# Patient Record
Sex: Male | Born: 1965 | Race: White | Hispanic: No | Smoking: Former smoker
Health system: Southern US, Community
[De-identification: ages and names within clinical notes are randomized; demographics above are authoritative.]

## PROBLEM LIST (undated history)

## (undated) DIAGNOSIS — Z87442 Personal history of urinary calculi: Secondary | ICD-10-CM

## (undated) DIAGNOSIS — E11319 Type 2 diabetes mellitus with unspecified diabetic retinopathy without macular edema: Secondary | ICD-10-CM

## (undated) DIAGNOSIS — F419 Anxiety disorder, unspecified: Secondary | ICD-10-CM

## (undated) DIAGNOSIS — M1990 Unspecified osteoarthritis, unspecified site: Secondary | ICD-10-CM

## (undated) DIAGNOSIS — K3184 Gastroparesis: Secondary | ICD-10-CM

## (undated) DIAGNOSIS — IMO0002 Reserved for concepts with insufficient information to code with codable children: Secondary | ICD-10-CM

## (undated) DIAGNOSIS — T8859XA Other complications of anesthesia, initial encounter: Secondary | ICD-10-CM

## (undated) DIAGNOSIS — S32402A Unspecified fracture of left acetabulum, initial encounter for closed fracture: Secondary | ICD-10-CM

## (undated) DIAGNOSIS — I509 Heart failure, unspecified: Secondary | ICD-10-CM

## (undated) DIAGNOSIS — J189 Pneumonia, unspecified organism: Secondary | ICD-10-CM

## (undated) DIAGNOSIS — I209 Angina pectoris, unspecified: Secondary | ICD-10-CM

## (undated) DIAGNOSIS — R011 Cardiac murmur, unspecified: Secondary | ICD-10-CM

## (undated) DIAGNOSIS — E785 Hyperlipidemia, unspecified: Secondary | ICD-10-CM

## (undated) DIAGNOSIS — R0600 Dyspnea, unspecified: Secondary | ICD-10-CM

## (undated) DIAGNOSIS — E119 Type 2 diabetes mellitus without complications: Secondary | ICD-10-CM

## (undated) DIAGNOSIS — F32A Depression, unspecified: Secondary | ICD-10-CM

## (undated) DIAGNOSIS — K219 Gastro-esophageal reflux disease without esophagitis: Secondary | ICD-10-CM

## (undated) DIAGNOSIS — I639 Cerebral infarction, unspecified: Secondary | ICD-10-CM

## (undated) DIAGNOSIS — R93429 Abnormal radiologic findings on diagnostic imaging of unspecified kidney: Secondary | ICD-10-CM

## (undated) DIAGNOSIS — N189 Chronic kidney disease, unspecified: Secondary | ICD-10-CM

## (undated) DIAGNOSIS — Z9289 Personal history of other medical treatment: Secondary | ICD-10-CM

## (undated) HISTORY — PX: TUBAL LIGATION: SHX77

## (undated) HISTORY — PX: KNEE ARTHROSCOPY W/ MENISCAL REPAIR: SHX1877

## (undated) HISTORY — DX: Personal history of other medical treatment: Z92.89

## (undated) HISTORY — PX: WISDOM TOOTH EXTRACTION: SHX21

## (undated) HISTORY — PX: COSMETIC SURGERY: SHX468

## (undated) HISTORY — DX: Cerebral infarction, unspecified: I63.9

## (undated) HISTORY — DX: Type 2 diabetes mellitus without complications: E11.9

## (undated) HISTORY — PX: TONSILLECTOMY: SUR1361

## (undated) HISTORY — PX: CARDIAC CATHETERIZATION: SHX172

## (undated) HISTORY — PX: UPPER GASTROINTESTINAL ENDOSCOPY: SHX188

## (undated) HISTORY — DX: Unspecified fracture of left acetabulum, initial encounter for closed fracture: S32.402A

## (undated) HISTORY — PX: CORONARY ANGIOPLASTY: SHX604

## (undated) HISTORY — PX: OTHER SURGICAL HISTORY: SHX169

## (undated) HISTORY — DX: Reserved for concepts with insufficient information to code with codable children: IMO0002

## (undated) HISTORY — DX: Hyperlipidemia, unspecified: E78.5

---

## 1998-11-10 ENCOUNTER — Inpatient Hospital Stay (HOSPITAL_COMMUNITY): Admission: EM | Admit: 1998-11-10 | Discharge: 1998-11-13 | Payer: Self-pay | Admitting: Cardiology

## 1998-11-11 ENCOUNTER — Encounter: Payer: Self-pay | Admitting: Cardiology

## 1998-11-12 ENCOUNTER — Encounter: Payer: Self-pay | Admitting: Cardiology

## 1999-01-17 ENCOUNTER — Emergency Department (HOSPITAL_COMMUNITY): Admission: EM | Admit: 1999-01-17 | Discharge: 1999-01-17 | Payer: Self-pay | Admitting: Emergency Medicine

## 1999-01-17 ENCOUNTER — Encounter: Payer: Self-pay | Admitting: Emergency Medicine

## 2000-04-01 ENCOUNTER — Inpatient Hospital Stay (HOSPITAL_COMMUNITY): Admission: EM | Admit: 2000-04-01 | Discharge: 2000-04-03 | Payer: Self-pay | Admitting: Emergency Medicine

## 2000-05-31 ENCOUNTER — Other Ambulatory Visit: Admission: RE | Admit: 2000-05-31 | Discharge: 2000-05-31 | Payer: Self-pay | Admitting: Internal Medicine

## 2000-05-31 ENCOUNTER — Encounter (INDEPENDENT_AMBULATORY_CARE_PROVIDER_SITE_OTHER): Payer: Self-pay | Admitting: Specialist

## 2000-08-21 ENCOUNTER — Encounter: Payer: Self-pay | Admitting: Emergency Medicine

## 2000-08-21 ENCOUNTER — Inpatient Hospital Stay (HOSPITAL_COMMUNITY): Admission: EM | Admit: 2000-08-21 | Discharge: 2000-08-23 | Payer: Self-pay | Admitting: Emergency Medicine

## 2001-11-15 ENCOUNTER — Emergency Department (HOSPITAL_COMMUNITY): Admission: EM | Admit: 2001-11-15 | Discharge: 2001-11-15 | Payer: Self-pay | Admitting: Emergency Medicine

## 2001-11-15 ENCOUNTER — Encounter: Payer: Self-pay | Admitting: Emergency Medicine

## 2008-03-10 ENCOUNTER — Emergency Department (HOSPITAL_COMMUNITY): Admission: EM | Admit: 2008-03-10 | Discharge: 2008-03-10 | Payer: Self-pay | Admitting: Emergency Medicine

## 2010-07-19 ENCOUNTER — Emergency Department (HOSPITAL_COMMUNITY): Payer: Commercial Managed Care - PPO

## 2010-07-19 ENCOUNTER — Observation Stay (HOSPITAL_COMMUNITY)
Admission: EM | Admit: 2010-07-19 | Discharge: 2010-07-20 | Disposition: A | Discharge status: Home / Self Care | Payer: Commercial Managed Care - PPO | Attending: Emergency Medicine | Admitting: Emergency Medicine

## 2010-07-19 DIAGNOSIS — R0602 Shortness of breath: Secondary | ICD-10-CM | POA: Insufficient documentation

## 2010-07-19 DIAGNOSIS — R110 Nausea: Secondary | ICD-10-CM | POA: Insufficient documentation

## 2010-07-19 DIAGNOSIS — R079 Chest pain, unspecified: Principal | ICD-10-CM | POA: Insufficient documentation

## 2010-07-19 LAB — POCT I-STAT, CHEM 8
Glucose, Bld: 94 mg/dL (ref 70–99)
HCT: 46 % (ref 39.0–52.0)
Hemoglobin: 15.6 g/dL (ref 13.0–17.0)
Sodium: 141 mEq/L (ref 135–145)
TCO2: 24 mmol/L (ref 0–100)

## 2010-07-19 LAB — POCT CARDIAC MARKERS
CKMB, poc: <1 ng/mL — ABNORMAL LOW (ref 1.0–8.0)
Myoglobin, poc: 93.9 ng/mL (ref 12–200)
Troponin i, poc: <0.05 ng/mL (ref 0.00–0.09)

## 2010-07-19 LAB — CBC
HCT: 43.6 % (ref 39.0–52.0)
MCH: 28.5 pg (ref 26.0–34.0)
RDW: 13.1 % (ref 11.5–15.5)

## 2010-07-19 LAB — DIFFERENTIAL
Basophils Absolute: 0 10*3/uL (ref 0.0–0.1)
Basophils Relative: 0 % (ref 0–1)
Lymphs Abs: 2.2 10*3/uL (ref 0.7–4.0)
Monocytes Relative: 6 % (ref 3–12)

## 2010-07-20 DIAGNOSIS — R0789 Other chest pain: Secondary | ICD-10-CM

## 2010-07-20 LAB — POCT CARDIAC MARKERS
CKMB, poc: 1.2 ng/mL (ref 1.0–8.0)
Myoglobin, poc: 111 ng/mL (ref 12–200)
Troponin i, poc: <0.05 ng/mL (ref 0.00–0.09)

## 2010-07-26 NOTE — Consult Note (Signed)
[name], [name]                  ACCOUNT NO.:  [id number]  MEDICAL RECORD NO.:  [id number]           PATIENT TYPE:  LOCATION:                                 FACILITY:  PHYSICIAN:  Leyna Vanderkolk C. Eden Emms, MD, FACCDATE OF BIRTH:  Nov 25, 1965  DATE OF CONSULTATION:  07/20/2010 DATE OF DISCHARGE:                                CONSULTATION   PRIMARY CARE PHYSICIAN:  Located at [facility].  He has not followed by Cardiology.  REASON FOR CONSULTATION:  Chest discomfort.  HISTORY OF PRESENT ILLNESS:  [name] is a 45 year old Caucasian gentleman with no known history of coronary artery disease having had a cardiac catheterization in 2000 that was negative as well as a negative Myoview with normal left ventricular ejection fraction in March 2002 but risk factors of morbid obesity and positive family history as well as history significant for history of chest pain with negative workup as above, history of peptic ulcer disease (resolved but previously on Pepcid) as well as remote tobacco abuse quitting in 2000, presenting with similar chest discomfort as his prior episodes lasting for greater than 24 hours resolving with morphine in the emergency department late last evening.  The patient was in his usual state of health until the evening of July 18, 2010, when he began to experience his typical chest discomfort described as a pressure in the chest and then a squeezing in the chest.  The patient did have some mild shortness of breath with this without radiation.  In the past, he has had radiation but this time he had none.  He did not have any diaphoresis, vomiting, presyncope, palpitations, or other complaints at that time.  He simply tried to ignore his symptoms, but unfortunately they continued throughout the next day.  He did note that he had more shortness of breath with activity, but his chest discomfort was not affected by exertion.  He did not note any other clear aggravating or  alleviating factors until he finally presented to [facility] ED for further eval and was given 4 mg of IV morphine with total resolution of symptoms.  He did have some nausea and eventually vomited after the morphine, but then was given Zofran and currently is asymptomatic, just frustrated with his recurrent symptoms. EKG was completed that showed no acute changes.  Point-of-care markers are negative x2 sets and chest x-ray without acute disease.  He was mildly hypertensive with a blood pressure of 159/100 on admission, but has since gone back to his baseline, actually one reading of low blood pressure 96/55, currently 110s/60s.  Otherwise, vital signs within normal limits and stable. 1. History of chest pain.     a.     Cardiac catheterization in 2000 (no evidence of coronary      artery disease).     b.     Myoview on August 14, 2000:  No evidence of ischemia, normal      EF. 2. Morbid obesity. 3. History of PUD (resolved with Pepcid). 4. Osteoarthritis (knees). 5. History of kidney tumor.     a.     S/P chemotherapy, 1992.  SOCIAL HISTORY:  The patient lives in [place] with a roommate.  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  He has a 30+ pack-year smoking history that is remote having quit over 10 years ago.  No EtOH or illicit drug use.  No herbal meds.  Regular diet.  Walks a lot at work, but no other exercise.  FAMILY HISTORY:  Father with MI in his 50s.  Mother, no known history of coronary artery disease.  Siblings including two brothers and a sister without any known coronary artery disease.  REVIEW OF SYSTEMS:  Please see HPI.  All other systems were reviewed. No orthopnea.  No lower extremity edema.  No fevers, chills.  No significant coughing.  No other complaints.  CODE STATUS:  Full.  ALLERGIES: 1. DEMEROL. 2. PERCOCET.  MEDICATIONS:  No home medications.  In the emergency department,  given morphine 4 mg IV x1 and Zofran 4 mg IV x2.  PHYSICAL EXAMINATION:  VITAL SIGNS:  Temperature 97.4 degrees Fahrenheit, BP 96-159 over 55-100, pulse 68-85, respiration rate 19-20, O2 saturation 95% on room air. GENERAL:  The patient is alert and oriented x3 in no apparent distress, able to speak easily in full sentences without respiratory distress. HEENT:  Head is normocephalic, atraumatic.  Pupils equal, round, reactive to light.  Extraocular muscles are intact.  Nares without discharge.  Dentition is fair.  Oropharynx without erythema or exudates. NECK:  Supple without lymphadenopathy.  No thyromegaly.  No JVD.  No bruit. HEART:  Rate is regular with audible S1 and S2.  No clicks, rubs, murmurs, or gallops, and pulses are 2+ and equal in both upper and lower extremities bilaterally. LUNGS:  Clear to auscultation bilaterally. SKIN:  No rashes, lesions, or petechiae. ABDOMEN:  Soft, nontender, nondistended.  Normal abdominal bowel sounds. No rebound or guarding.  No hepatosplenomegaly.  The patient is morbidly obese. EXTREMITIES:  Without clubbing, cyanosis or edema. MUSCULOSKELETAL:  Without joint deformity or effusions.  No spinal or CVA tenderness. NEUROLOGIC:  Cranial nerves II through XII grossly intact.  Strength 5/5 in all extremities and axial groups.  Normal sensation throughout. Normal cerebellar function.  RADIOLOGY:  Chest x-ray two-view showed no acute disease.  EKG rhythm is normal sinus, 71 bpm, no significant ST-T-wave changes. No significant Q-waves.  Normal axis.  No evidence of hypertrophy.  PR 208, QRS 92, QTc 430.  When compared to a prior tracing from October 2009, no significant changes.  LABORATORY DATA:  WBC is 8.5, HGB 14.7, HCT 43.6, PLT count is 255,000. Sodium 141, potassium 4.3, chloride 106, BUN 13, creatinine 1.0, glucose 94.  Point-of-care markers negative x2.  ASSESSMENT AND PLAN:  The patient initially seen by Jarrett Ables, PA- C, and  then seen and thoroughly assessed by attending cardiologist, Dr. Charlton Haws.  Mr. Chapdelaine is a 45 year old Caucasian gentleman with no known history of coronary artery disease but unfortunately history of recurrent chest discomfort with prior negative cardiac catheterization in 2000 and a negative Myoview with normal ejection fraction in March 1972 but risk factors of CAD including morbid obesity and positive family history as well as history significant for PUD now presents with atypical chest numbers similar to his prior symptoms that had previously yielded a negative workup.  Chest pain - symptoms atypical.  No objective evidence for cardiac etiology.  We would treat for a GI etiology and set up outpatient Myoview.  As the patient weighs 267 pounds, this  is not able to be set up in our office and therefore we will set up here at [facility] for August 10, 2010, and August 11, 2010 (needs to be a 2-day study given the patient's girth).  The patient will also be given a prescription for famotidine 20 mg p.o. b.i.d. x6 weeks and be instructed to follow up with his primary care provider in the interim.  Should the patient have evidence of ischemia or significantly depressed LVEF, he will follow up in [place] with Dr. Julien Nordmann.     Jarrett Ables, PAC   ______________________________ Noralyn Pick. Eden Emms, MD, [facility]    MS/MEDQ  D:  07/20/2010  T:  07/20/2010  Job:  [id number]  Electronically Signed by Jarrett Ables PAC on 07/22/2010 01:07:01 PM Electronically Signed by Charlton Haws MD FACC on 07/26/2010 09:51:58 AM

## 2010-08-10 ENCOUNTER — Ambulatory Visit (HOSPITAL_COMMUNITY)
Admit: 2010-08-10 | Discharge: 2010-08-10 | Disposition: A | Discharge status: Home / Self Care | Payer: Commercial Managed Care - PPO | Attending: Cardiovascular Disease | Admitting: Cardiovascular Disease

## 2010-08-10 DIAGNOSIS — R079 Chest pain, unspecified: Secondary | ICD-10-CM | POA: Insufficient documentation

## 2010-08-11 ENCOUNTER — Ambulatory Visit (HOSPITAL_COMMUNITY)
Admit: 2010-08-11 | Discharge: 2010-08-11 | Disposition: A | Discharge status: Home / Self Care | Payer: Commercial Managed Care - PPO | Attending: Cardiovascular Disease | Admitting: Cardiovascular Disease

## 2010-08-11 DIAGNOSIS — R079 Chest pain, unspecified: Secondary | ICD-10-CM

## 2010-08-11 MED ORDER — TECHNETIUM TC 99M TETROFOSMIN IV KIT
30.0000 | PACK | Freq: Once | INTRAVENOUS | Status: AC | PRN
  Administered 2010-08-10: 30 via INTRAVENOUS

## 2010-08-11 MED ORDER — TECHNETIUM TC 99M TETROFOSMIN IV KIT
30.0000 | PACK | Freq: Once | INTRAVENOUS | Status: AC | PRN
  Administered 2010-08-11: 30 via INTRAVENOUS

## 2010-08-15 ENCOUNTER — Ambulatory Visit: Payer: Commercial Managed Care - PPO

## 2010-08-15 ENCOUNTER — Telehealth: Payer: Self-pay | Admitting: Cardiovascular Disease

## 2010-08-23 NOTE — Progress Notes (Signed)
Summary: stress test  Phone Note Other Incoming   Caller: Mechele Dawley office Summary of Call: received call from [place] that pt was calling their office for his test results, he had a stress test at [facility] on 3/8, pt cell [phone], called pt and gave him his test results, he states he is feeling better advised if needs f/u appt to call back and sch he is agreeable  Initial call taken by: Meredith Staggers, RN,  August 15, 2010 3:30 PM

## 2010-10-21 NOTE — Discharge Summary (Signed)
[facility]  Patient:    [name], [name]                         MRN: [id number] Adm. Date:  [id number] Disc. Date: [id number] Attending:  Darnelle Bos CC:         [facility]                           Discharge Summary  ADMISSION DIAGNOSIS:  Chest pain.  DISCHARGE DIAGNOSES: 1. Chest pain, myocardial infarction ruled out without evidence of coronary    artery disease on stress Cardiolite. 2. History of congestive heart failure with normal coronary arteries and    normal left ventricular function several years ago. 3. Recent peptic ulcer disease.  HISTORY OF PRESENT ILLNESS:  Please see the admitting history and physical examination for details, but briefly, the patient had chest pain on the day of admission that was a tight feeling in his chest whenever he would walk around. He came to the emergency room where it was thought that he might have suffered from either a myocardial infarction or at least severe angina, and he was referred to me for admission.  HOSPITAL COURSE:  The patient was admitted, and on the basis of serial enzymes and EKGs, myocardial infarction was ruled out.  He was treated with heparin and IV nitroglycerin.  His pain resolved.  On the second hospital day he had a stress Cardiolite which was negative, and he was discharged in improved condition to continue taking his Prevacid 30 mg q.d.  FOLLOWUP:  He is to follow up with Urgent Care Center within the next 2 to 3 weeks.  DISCHARGE MEDICATIONS:  Prevacid 30 mg q.d.  ACTIVITY:  As tolerated.  DIET:  As tolerated. DD:  08/28/00 TD:  08/29/00 Job: [id number]

## 2010-10-21 NOTE — Discharge Summary (Signed)
[place]  Patient:    [name], [name]                         MRN: [id number] Adm. Date:  [id number] Disc. Date: 04/03/00 Attending:  Phifer, Harriett Sine [place] Dictator:   Milana Huntsman, M.D. CC:         Wilhemina Bonito. Eda Keys., M.D. [facility]  Dr. Molli Hazard   Discharge Summary  DISCHARGE DIAGNOSES: 1. Gastric ulcer with upper gastrointestinal bleed. 2. History of episode of congestive heart failure in June 2000, with normal    cardiac catheterization performed by Dr. Clarene Duke.  Per Dr. Darrol Poke report,    there was no clear reason why the patient had this episode.  ALLERGIES:  DEMEROL and PERCOCET.  MEDICATIONS:  None.  HISTORY OF PRESENT ILLNESS:  This is a 45 year old white male with no significant previous medical history who presented with vomiting blood x 1. The patient states that he had had a cold for the past several days with one episode of diarrhea roughly four to five days prior to presentation with brown stools at that time and had no further episodes of diarrhea, but on the morning of presentation awoke feeling nauseated without abdominal pain.  He went to the bathroom and had one episode of bloody emesis and decided to present to the emergency department.  The patient states he had no previous history of similar episodes and he specifically denied any use of NSAIDs, alcohol or GERD type symptoms.  He also states that he quit drinking several years ago and had no know risk factors for liver disease.  SOCIAL HISTORY:  The patient does not smoke, does not drink alcohol and has no history of IV drug abuse.  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  FAMILY HISTORY:  Grandmother with ulcers.  All siblings, two brothers and two sisters, with GERD.  PHYSICAL EXAMINATION:  VITAL SIGNS:  Temperature 97.3, pulse 120, blood pressure 126/67, respiratory rate 20, saturations 99% on room air.  GENERAL:  He was alert and  conversational in mild distress lying in bed.  HEENT:  Normocephalic, atraumatic with no lesions orally.  CARDIOVASCULAR:  Regular rate and rhythm with distant heart sounds.  LUNGS:  Clear to auscultation bilaterally.  ABDOMEN:  Obese and diffusely tender.  No guarding.  EXTREMITIES:  Warm and well-profused.  NEUROLOGIC:  No gross deficits.  Cranial nerves II-XII intact.  LABORATORY DATA AND X-RAY FINDINGS:  On admission, white count 12.1, hemoglobin 12.9, hematocrit 36.8, platelets 256, MCV 82.  PTT 27, PT 13, INR 1.1.  Sodium 139, potassium 3.9, chloride 105, bicarb 26, BUN 27, creatinine 1.1 and glucose 141, calcium 8.7.  Albumin 2.3, total protein 6.5, AST 24, ALT 24, Alk phos 39, total bilirubin 0.6.  ASSESSMENT/PLAN:  The patient with upper GI bleed.  It was felt that peptic ulcer disease was most likely cause, however, gastritis, Mallory-Weiss tears and to a much lesser extent, varices, were in the differential.  CONSULTATIONS:  Gastroenterology.  PROCEDURES:  Esophagogastroduodenoscopy which showed mucosal abnormality in duodenal bulb.  Erosions present.  Erythematous mucosa.  Ulcer in the antrum at 7 oclock with maximum size 7 mm, not bleeding with clear ulcer base.  HOSPITAL COURSE:  The patient had two more episodes of frank bloody vomiting while in the emergency department.  He then had no other episodes during the course of his hospitalization.  He was admitted  to the transitional care unit and watched closely.  His hemoglobin trended down from initial presentation of 12.9 to 10.4 on the following day.  Overnight, the patient had a drop in his blood pressures from what had been in the 120s/60s-70s to a low of 80/40.  The patient was fluid resuscitated with a blood pressure, placed in Trendelenburg, and his blood pressure came up to 100-110/50s-60s.  During the episode of acute hypotension, the patient felt somewhat dizzy, but had no loss of consciousness.   Gastroenterology was consulted and on April 02, 2000, did an upper EGD which showed both mucosal abnormality in the duodenum and ulcer. After the upper GI series, the patient did quite well.  GI felt that he did not need any further intervention and could be managed medically.  He was found to be H. pylori negative, therefore, not requiring antibiotic therapy.  DISCHARGE LABORATORY DATA:  Hemoglobin 10.1.  DISPOSITION:  Discharged to home in good health.  FOLLOWUP:  Follow up with Dr. Yancey Flemings at [facility] on Monday, May 14, 2000.  Follow up with his primary care physician, Dr. Lilyan Punt. The patient was scheduled to have a repeat upper endoscopy on May 30, 2000.  DISCHARGE MEDICATIONS: 1. Protonix 40 mg one b.i.d. x 7 days, then one once a day. 2. Iron 325 mg b.i.d.  SPECIAL INSTRUCTIONS:  The patient was instructed not to take any NSAIDs.DD: 04/03/00 TD:  04/03/00 Job: [id number]

## 2010-10-21 NOTE — Consult Note (Signed)
[facility]  Patient:    [name], [name]                         MRN: [id number] Proc. Date: 08/22/00 Adm. Date:  [id number] Attending:  Darnelle Bos                          Consultation Report  REFERRING:  Winn Jock. Earl Gala, M.D.  REASON FOR CONSULTATION:  Chest pain.  HISTORY OF PRESENT ILLNESS:  [name] is a pleasant 45 year old male who developed an episode of substernal chest pain two nights prior to presentation.  The patient states he took two aspirins, went to bed, and the pain resolved.  He went to work the next day and while sitting at his desk he developed chest pain again.  The pain was described as a squeezing-type pain associated with radiation to his biceps and groin.  Aggravating factors include exertion.  The pain appeared to be relieved with rest.  There was associated diaphoresis but no nausea and no vomiting.  He presented to the emergency room when the chest pain became significantly worse.  He was treated in the emergency room with aspirin.  Heparin was not initiated secondary to peptic ulcer disease October 2001.  He was started on IV nitroglycerin which resolved his chest pain.  Of note, the patient has had a similar episode of the chest pain in June 2000.  At this time, left heart catheterization was performed by Dr. Julieanne Manson.  He was noted to have normal coronary arteries with normal systolic function.  Of note, the patient presents at this time with congestive heart failure and a PO2 of 60 on room air.  Serial cardiac enzymes were negative at that time.  PAST MEDICAL HISTORY: 1. Peptic ulcer disease October 2000 which resolved with Pepcid.  Followup    endoscopy in December 2001 was normal. 2. Kidney tumor treated with chemotherapy approximately 10 years ago. 3. Morbid obesity. 4. Arthritis primarily involving his knees.  PAST SURGICAL HISTORY:  Left heart catheterization in June 2000, as  noted above.  ALLERGIES: 1. DEMEROL, which results in throat swelling and apnea. 2. PERCOCET, which results in a rash.  CURRENT MEDICATIONS: 1. Protonix 40 mg p.o. q.d. 2. Phenergan p.r.n. 3. Heparin. 4. Nitroglycerin 5. Tylenol p.r.n.  SOCIAL HISTORY:  He is single.  He quit smoking three years ago.  No history of alcohol for five years.  Previously in the [employer], at which time his cancer was treated.  Currently, no available records for his chemotherapy. The patient currently is employed as a Advertising copywriter at [employer].  He lives with his father.  Significant caffeine intake including eight cups per day.  FAMILY HISTORY:  Father is 47 and had a myocardial infarction at age 1 after a PTCA and continues to smoke.  Mother is 23 with myocardial infarction at age 76.  Two brothers, one with hypertension.  Two sisters who are alive and well.  REVIEW OF SYSTEMS:  No changes in vision or hearing.  No dysphagia.  No blood in the stool.  Continues to have arthritic pain.  History is significant for dependent edema.  No orthopnea, no PND.  PHYSICAL EXAMINATION:  GENERAL:  A morbidly obese male, pleasant.  Currently in no acute distress. He states he continues to have a nagging, squeezing chest pain.  Chest pain has resolved significantly from admission.  VITAL  SIGNS:  Blood pressure 113/72, heart rate 78, respiratory rate 18, O2 saturation 96% on room air.  NECK:  Difficult to exam secondary to his neck size.  There is no obvious neck vein distention.  Good carotid upstrokes.  No carotid bruits are noted.  PULMONARY:  No use of accessory muscles.  Breath sounds are equal and clear to auscultation.  CARDIOVASCULAR:  Regular rate and rhythm.  No rubs, murmurs, or gallops. Unable to palpate the PMI.  ABDOMEN:  Morbidly obese.  No bruits are noted.  No unusual hepatosplenomegaly.  Exam made difficult by obesity.  EXTREMITIES:  Distal pulses are equal and palpable.  There  is trace pedal edema noted.  SKIN:  Warm and dry.  NEUROLOGIC:  Nonfocal.  Motor is 5/5 throughout.  LABORATORY DATA:  White count 6.4, hematocrit 41, platelets 252.  Normal coags.  The patient has been initiated on heparin.  Normal electrolytes. Creatinine is 1.5.  Normal liver enzymes.  Serial cardiac enzymes have been negative.  Chest x-ray reveals no acute disease.  ECG reveals normal sinus rhythm.  No acute ST changes.  T-wave inversion noted in lead III.  IMPRESSION:  A 45 year old male with ongoing chest pain.  Previous left heart catheterization in June 2000 revealed no significant coronary artery disease. EKG reveals no evidence of acute ischemia.  We will proceed with stress Cardiolite in the a.m. for further evaluation.  Agree with continuation of Protonix for possible GERD symptoms.  Continue with IV nitroglycerin and heparin.  The patient has had no obvious GI bleed.  Further recommendations pending the outcome on the stress Cardiolite. DD:  08/22/00 TD:  08/23/00 Job: [id number]

## 2010-10-21 NOTE — H&P (Signed)
[place]  Patient:    [name], [name]                         MRN: [id number] Adm. Date:  [id number] Attending:  Devoria Albe                         History and Physical  HISTORY OF PRESENT ILLNESS:  [name] is a 45 year old white male admitted with chest pain.  Yesterday, he felt fine.  He was at work today sitting at his desk when he suddenly developed chest discomfort.  It was squeezing pain associated with shooting pain into the biceps and into the groin.  It was worse with walking and better with sitting.  He was sweaty and dyspneic.  He went home, laid down, and the chest pain worsened so he came to the emergency room.  He has been given aspirin so far but has not been treated with heparin due to a history of bleeding ulcer in October 2001.  He was started on nitroglycerin. That helped the discomfort, especially the shooting pains in the arms and groin.  He noted that the pain was worsening and nitroglycerin has been increased.  In October 2001 he presented with hematemesis.  He was admitted to [facility] and had an EGD with findings of peptic ulcer disease.  He was treated with Protonix initially.  Follow-up endoscopy in December 2001 showed healing. There was also evidence of reflux and a hiatal hernia.  He was treated with Prevpac because he was H. pylori positive.  He remains on Prevacid and was to take it until his prescription ran out.  He has no heartburn on the Prevacid.  PAST MEDICAL HISTORY:  MEDICAL:  "Congestive heart failure" without clear etiology, status post coronary arteriogram in June 2000 by Dr. Clarene Duke.  This was reportedly negative.  VQ scan at that time was negative.  SURGICAL:  Knee arthroscopy bilaterally.  Tonsillectomy.  MEDICATIONS:  Prevacid 30 mg daily.  ALLERGIES: PERCOCET and DEMEROL.  SOCIAL HISTORY:  Cigarettes - quit three years ago.  Alcohol - negative x 5 years.  Works as a Dance movement psychotherapist at [employer].  Not married.  He and his father live together.  FAMILY HISTORY:  Father had an MI in his 19s and is otherwise alive and well. Mother alive and well.  REVIEW OF SYSTEMS:  Mild peripheral edema.  Otherwise negative.  PHYSICAL EXAMINATION:  VITAL SIGNS:  Blood pressure 140/70, pulse 92, respiratory rate 18, temperature 98.2.  HEENT:  Eyes have pupils which are round and reactive to light.  The extraocular movements are intact.  The funduscopic exam is normal.  Ears are normal, nose and throat are unremarkable.  NECK:  Supple.  Thyroid is not enlarged or tender.  CHEST:  Clear to auscultation and percussion.  CARDIAC:  Has a normal S1 and S2 without an S3, S4, murmur, rub, or click.  ABDOMEN:  Soft and nontender with normal bowel sounds without hepatosplenomegaly or mass.  EXTREMITIES:  Without cyanosis, clubbing, or edema.  LABORATORY DATA:  CMET, CBC, PT, PTT, troponin, and CK are all negative.  EKG is normal.  IMPRESSION: 1. Chest pain with typical and atypical features. 2. History of bleeding peptic ulcer disease. 3. Gastroesophageal reflux disease. 4. History of "congestive heart failure."  I will admit the patient to telemetry.  IV heparin will be added.  He  has already received aspirin and continue IV nitroglycerin.  Will obtain serial enzymes and cardiology consultation in the morning. DD:  08/21/00 TD:  08/22/00 Job: [id number]

## 2011-03-06 LAB — POCT I-STAT, CHEM 8
BUN: 14
Calcium, Ion: 1.22
Chloride: 106
Glucose, Bld: 99
Hemoglobin: 15
TCO2: 28

## 2011-03-06 LAB — CBC
MCHC: 33.4
Platelets: 213
RBC: 4.99
RDW: 13.1
WBC: 7.5

## 2011-03-06 LAB — POCT CARDIAC MARKERS
CKMB, poc: 1.1
Myoglobin, poc: 80.4
Troponin i, poc: <0.05

## 2012-07-09 ENCOUNTER — Ambulatory Visit (INDEPENDENT_AMBULATORY_CARE_PROVIDER_SITE_OTHER): Payer: Commercial Managed Care - PPO | Admitting: Family Medicine

## 2012-07-09 VITALS — BP 129/86 | HR 92 | Temp 98.4°F | Resp 17 | Ht 74.5 in | Wt 362.0 lb

## 2012-07-09 DIAGNOSIS — R071 Chest pain on breathing: Secondary | ICD-10-CM

## 2012-07-09 DIAGNOSIS — R0781 Pleurodynia: Secondary | ICD-10-CM | POA: Insufficient documentation

## 2012-07-09 DIAGNOSIS — M545 Low back pain, unspecified: Secondary | ICD-10-CM

## 2012-07-09 DIAGNOSIS — R109 Unspecified abdominal pain: Secondary | ICD-10-CM | POA: Insufficient documentation

## 2012-07-09 DIAGNOSIS — J069 Acute upper respiratory infection, unspecified: Secondary | ICD-10-CM

## 2012-07-09 LAB — POCT UA - MICROSCOPIC ONLY
Bacteria, U Microscopic: NEGATIVE
Casts, Ur, LPF, POC: NEGATIVE

## 2012-07-09 LAB — POCT URINALYSIS DIPSTICK
Bilirubin, UA: NEGATIVE
Glucose, UA: NEGATIVE
Leukocytes, UA: NEGATIVE
Protein, UA: 30
Urobilinogen, UA: 0.2

## 2012-07-09 MED ORDER — ACETAMINOPHEN-CODEINE #3 300-30 MG PO TABS: 1.0000 | ORAL_TABLET | ORAL | Status: DC | PRN

## 2012-07-09 MED ORDER — TAMSULOSIN HCL 0.4 MG PO CAPS: 0.4000 mg | ORAL_CAPSULE | Freq: Every day | ORAL | Status: DC

## 2012-07-09 NOTE — Patient Instructions (Addendum)
Thank you for coming in today. We need to get you to urologist for kidney stone evaluation and treatment. Please take the Flomax and pain medications as needed.  Expect a phone call tomorrow.  If you do not hear from [place] cause back  If you get worse or have vomiting and intolerable pain call us or go to the emergency room. I think you have rib pain.  This will get better when your cold gets better. Call or go to the emergency room if you get worse, have trouble breathing, have chest pains, or palpitations.

## 2012-07-09 NOTE — Progress Notes (Signed)
[name] is a 47 y.o. male who presents to [facility] today for left-sided low back pain for 2 days. Pain is located in the left low back without radiation. It is quite bothersome worse with sitting and better with rest. He notes a little blood in his urine and thinks perhaps he is a kidney infection or kidney stone.  He notes that he has a history of kidney stone but this pain is not as bad or consistent with prior episodes of kidney stones. He denies any fevers chills nausea vomiting abdominal pain.  Additionally he notes 5 days of nasal congestion and cough which she is treating with over-the-counter medications which help. Additionally he notes left-sided rib pain present for several days. This is worse with opportunity motion and better with rest. He denies exertional pain palpitations or shortness of breath. He notes that he had a normal nuclear stress test March of 2012.    His pertinent medical history significant for left kidney pain and bleeding when he was in the [employer] 20 years ago. This is worked up and found to be a normal variant.  He has not had issues with this in the interim.    PMH: Reviewed significant for obesity. History of left kidney pain and bleeding 20 years ago as previously noted above History  Substance Use Topics  . Smoking status: Former Smoker    Start date: 07/09/1981    Quit date: 07/10/1999  . Smokeless tobacco: Not on file  . Alcohol Use: No   ROS as above  Medications reviewed. Current Outpatient Prescriptions  Medication Sig Dispense Refill  . Multiple Vitamins-Minerals (MULTIVITAMIN WITH MINERALS) tablet Take 1 tablet by mouth daily.      . Naproxen Sodium (ALEVE PO) Take by mouth.      . phenylephrine (NEO-SYNEPHRINE) 0.5 % nasal solution Place 1 drop into the nose every 4 (four) hours as needed.      . Pseudoephedrine-Naproxen Na (ALEVE-D SINUS & HEADACHE PO) Take by mouth.       allergic to Darvocet and oxycodone. Patient can take Tylenol #3  Exam:   BP 129/86  Pulse 92  Temp 98.4 F (36.9 C) (Oral)  Resp 17  Ht 6' 2.5" (1.892 m)  Wt 362 lb (164.202 kg)  BMI 45.86 kg/m2  SpO2 95% Gen: Well NAD. Obese HEENT: EOMI,  MMM, clear nasal discharge Lungs: CTABL Nl WOB Chest wall: Mildly tender to palpation left chest wall Heart: RRR no MRG Abd: NABS, NT, ND, no costovertebral angle tenderness to percussion.  Exts: Non edematous BL  LE, warm and well perfused.  Back: Nontender spinal midline normal range of motion to flexion extension rotation. Normal gait strength is intact bilateral lower extremities.  Results for orders placed in visit on 07/09/12 (from the past 72 hour(s))  POCT URINALYSIS DIPSTICK     Status: Normal   Collection Time   07/09/12  5:40 PM      Component Value Range Comment   Color, UA amber      Clarity, UA cloudy      Glucose, UA neg      Bilirubin, UA neg      Ketones, UA 15      Spec Grav, UA >=1.030      Blood, UA large      pH, UA 5.0      Protein, UA 30      Urobilinogen, UA 0.2      Nitrite, UA neg  Leukocytes, UA Negative     POCT UA - MICROSCOPIC ONLY     Status: Normal   Collection Time   07/09/12  5:40 PM      Component Value Range Comment   WBC, Ur, HPF, POC 0-1      RBC, urine, microscopic tntc      Bacteria, U Microscopic neg      Mucus, UA eng      Epithelial cells, urine per micros 0-2      Crystals, Ur, HPF, POC neg      Casts, Ur, LPF, POC neg      Yeast, UA eng      Twelve-lead EKG shows sinus rhythm at 90 beats per minute. No ST or T wave segment abnormalities. One PVC.  Within normal limits.   Assessment and Plan: 47 y.o. male with   1) left flank pain with hematuria. History of kidney stone. This is the most likely diagnosis. Additionally he has a history of kidney pain and bleeding 20 years ago. Plan pain medication, Flomax, referral to urology. If worsening prior to visit the patient will return here or go to the emergency room for CT scan of his abdomen and pelvis.   Discussed warning signs or symptoms.  2) left rib pain. Likely costochondritis. Normal EKG with history of normal stress test in March 2012.  Reassurance and pain medications warning signs discussed.   3) URI: Symptomatic treatment

## 2012-07-10 NOTE — Progress Notes (Signed)
EKG read and patient discussed with Dr. Corey. Agree with assessment and plan of care per his note.   

## 2013-08-03 ENCOUNTER — Ambulatory Visit: Payer: Commercial Managed Care - PPO

## 2013-08-03 ENCOUNTER — Ambulatory Visit (INDEPENDENT_AMBULATORY_CARE_PROVIDER_SITE_OTHER): Payer: Commercial Managed Care - PPO | Admitting: Family Medicine

## 2013-08-03 VITALS — BP 128/84 | HR 80 | Temp 98.1°F | Resp 16 | Ht 73.5 in | Wt 331.0 lb

## 2013-08-03 DIAGNOSIS — T148XXA Other injury of unspecified body region, initial encounter: Secondary | ICD-10-CM

## 2013-08-03 DIAGNOSIS — M25551 Pain in right hip: Secondary | ICD-10-CM

## 2013-08-03 DIAGNOSIS — M25559 Pain in unspecified hip: Secondary | ICD-10-CM

## 2013-08-03 DIAGNOSIS — M25511 Pain in right shoulder: Secondary | ICD-10-CM

## 2013-08-03 DIAGNOSIS — M25519 Pain in unspecified shoulder: Secondary | ICD-10-CM

## 2013-08-03 MED ORDER — METHOCARBAMOL 500 MG PO TABS: 500.0000 mg | ORAL_TABLET | Freq: Three times a day (TID) | ORAL | Status: DC | PRN

## 2013-08-03 MED ORDER — ACETAMINOPHEN-CODEINE #4 300-60 MG PO TABS: 1.0000 | ORAL_TABLET | Freq: Four times a day (QID) | ORAL | Status: DC | PRN

## 2013-08-03 MED ORDER — MELOXICAM 7.5 MG PO TABS: 7.5000 mg | ORAL_TABLET | Freq: Two times a day (BID) | ORAL | Status: DC | PRN
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
## 2013-08-13 ENCOUNTER — Telehealth: Payer: Self-pay

## 2013-08-13 NOTE — Telephone Encounter (Signed)
Patient would like referral to an ortho if possible please call him at [phone]

## 2013-08-13 NOTE — Telephone Encounter (Signed)
Left message with male at home to call back at his convenience.

## 2013-08-14 NOTE — Telephone Encounter (Signed)
Pt returning our call about his referral -he states he wants a referral to an ortho office for his shoulder

## 2013-08-14 NOTE — Telephone Encounter (Signed)
Referral to [facility] Dr Alfonso Ramus , 08/15/2013 @ 11 am [address]

## 2013-08-14 NOTE — Telephone Encounter (Signed)
Dr Marin Comment: Can you refer to ortho for pt's shoulder per his request? Thanks.

## 2013-08-15 ENCOUNTER — Other Ambulatory Visit: Payer: Self-pay | Admitting: Sports Medicine

## 2013-08-15 DIAGNOSIS — M25511 Pain in right shoulder: Secondary | ICD-10-CM

## 2013-08-24 ENCOUNTER — Ambulatory Visit
Admission: RE | Admit: 2013-08-24 | Discharge: 2013-08-24 | Disposition: A | Discharge status: Home / Self Care | Payer: Commercial Managed Care - PPO | Source: Ambulatory Visit | Attending: Sports Medicine | Admitting: Sports Medicine

## 2013-08-24 DIAGNOSIS — M25511 Pain in right shoulder: Secondary | ICD-10-CM

## 2013-12-17 DIAGNOSIS — Z0271 Encounter for disability determination: Secondary | ICD-10-CM

## 2013-12-22 ENCOUNTER — Ambulatory Visit (INDEPENDENT_AMBULATORY_CARE_PROVIDER_SITE_OTHER): Payer: Commercial Managed Care - PPO

## 2013-12-22 ENCOUNTER — Ambulatory Visit (INDEPENDENT_AMBULATORY_CARE_PROVIDER_SITE_OTHER): Payer: Commercial Managed Care - PPO | Admitting: Emergency Medicine

## 2013-12-22 VITALS — BP 122/80 | HR 83 | Temp 98.2°F | Resp 18 | Ht 74.0 in | Wt 333.0 lb

## 2013-12-22 DIAGNOSIS — R05 Cough: Secondary | ICD-10-CM

## 2013-12-22 DIAGNOSIS — R059 Cough, unspecified: Secondary | ICD-10-CM

## 2013-12-22 DIAGNOSIS — J3489 Other specified disorders of nose and nasal sinuses: Secondary | ICD-10-CM

## 2013-12-22 DIAGNOSIS — J029 Acute pharyngitis, unspecified: Secondary | ICD-10-CM

## 2013-12-22 DIAGNOSIS — R0981 Nasal congestion: Secondary | ICD-10-CM

## 2013-12-22 LAB — POCT RAPID STREP A (OFFICE): RAPID STREP A SCREEN: NEGATIVE

## 2013-12-22 MED ORDER — FLUTICASONE PROPIONATE 50 MCG/ACT NA SUSP: 2.0000 | Freq: Every day | NASAL | Status: DC

## 2013-12-22 MED ORDER — AZITHROMYCIN 250 MG PO TABS: ORAL_TABLET | ORAL | Status: DC

## 2013-12-22 MED ORDER — BENZONATATE 100 MG PO CAPS: 100.0000 mg | ORAL_CAPSULE | Freq: Three times a day (TID) | ORAL | Status: DC | PRN

## 2013-12-22 NOTE — Patient Instructions (Signed)
Sinusitis Sinusitis is redness, soreness, and swelling (inflammation) of the paranasal sinuses. Paranasal sinuses are air pockets within the bones of your face (beneath the eyes, the middle of the forehead, or above the eyes). In healthy paranasal sinuses, mucus is able to drain out, and air is able to circulate through them by way of your nose. However, when your paranasal sinuses are inflamed, mucus and air can become trapped. This can allow bacteria and other germs to grow and cause infection. Sinusitis can develop quickly and last only a short time (acute) or continue over a long period (chronic). Sinusitis that lasts for more than 12 weeks is considered chronic.  CAUSES  Causes of sinusitis include:  Allergies.  Structural abnormalities, such as displacement of the cartilage that separates your nostrils (deviated septum), which can decrease the air flow through your nose and sinuses and affect sinus drainage.  Functional abnormalities, such as when the small hairs (cilia) that line your sinuses and help remove mucus do not work properly or are not present. SYMPTOMS  Symptoms of acute and chronic sinusitis are the same. The primary symptoms are pain and pressure around the affected sinuses. Other symptoms include:  Upper toothache.  Earache.  Headache.  Bad breath.  Decreased sense of smell and taste.  A cough, which worsens when you are lying flat.  Fatigue.  Fever.  Thick drainage from your nose, which often is green and may contain pus (purulent).  Swelling and warmth over the affected sinuses. DIAGNOSIS  Your caregiver will perform a physical exam. During the exam, your caregiver may:  Look in your nose for signs of abnormal growths in your nostrils (nasal polyps).  Tap over the affected sinus to check for signs of infection.  View the inside of your sinuses (endoscopy) with a special imaging device with a light attached (endoscope), which is inserted into your  sinuses. If your caregiver suspects that you have chronic sinusitis, one or more of the following tests may be recommended:  Allergy tests.  Nasal culture--A sample of mucus is taken from your nose and sent to a lab and screened for bacteria.  Nasal cytology--A sample of mucus is taken from your nose and examined by your caregiver to determine if your sinusitis is related to an allergy. TREATMENT  Most cases of acute sinusitis are related to a viral infection and will resolve on their own within 10 days. Sometimes medicines are prescribed to help relieve symptoms (pain medicine, decongestants, nasal steroid sprays, or saline sprays).  However, for sinusitis related to a bacterial infection, your caregiver will prescribe antibiotic medicines. These are medicines that will help kill the bacteria causing the infection.  Rarely, sinusitis is caused by a fungal infection. In theses cases, your caregiver will prescribe antifungal medicine. For some cases of chronic sinusitis, surgery is needed. Generally, these are cases in which sinusitis recurs more than 3 times per year, despite other treatments. HOME CARE INSTRUCTIONS   Drink plenty of water. Water helps thin the mucus so your sinuses can drain more easily.  Use a humidifier.  Inhale steam 3 to 4 times a day (for example, sit in the bathroom with the shower running).  Apply a warm, moist washcloth to your face 3 to 4 times a day, or as directed by your caregiver.  Use saline nasal sprays to help moisten and clean your sinuses.  Take over-the-counter or prescription medicines for pain, discomfort, or fever only as directed by your caregiver. SEEK IMMEDIATE MEDICAL CARE IF:    You have increasing pain or severe headaches.  You have nausea, vomiting, or drowsiness.  You have swelling around your face.  You have vision problems.  You have a stiff neck.  You have difficulty breathing. MAKE SURE YOU:   Understand these  instructions.  Will watch your condition.  Will get help right away if you are not doing well or get worse. Document Released: 05/22/2005 Document Revised: 08/14/2011 Document Reviewed: 06/06/2011 [facility] Patient Information 2015 [place], [place]. This information is not intended to replace advice given to you by your health care provider. Make sure you discuss any questions you have with your health care provider. Cough, Adult  A cough is a reflex that helps clear your throat and airways. It can help heal the body or may be a reaction to an irritated airway. A cough may only last 2 or 3 weeks (acute) or may last more than 8 weeks (chronic).  CAUSES Acute cough:  Viral or bacterial infections. Chronic cough:  Infections.  Allergies.  Asthma.  Post-nasal drip.  Smoking.  Heartburn or acid reflux.  Some medicines.  Chronic lung problems (COPD).  Cancer. SYMPTOMS   Cough.  Fever.  Chest pain.  Increased breathing rate.  High-pitched whistling sound when breathing (wheezing).  Colored mucus that you cough up (sputum). TREATMENT   A bacterial cough may be treated with antibiotic medicine.  A viral cough must run its course and will not respond to antibiotics.  Your caregiver may recommend other treatments if you have a chronic cough. HOME CARE INSTRUCTIONS   Only take over-the-counter or prescription medicines for pain, discomfort, or fever as directed by your caregiver. Use cough suppressants only as directed by your caregiver.  Use a cold steam vaporizer or humidifier in your bedroom or home to help loosen secretions.  Sleep in a semi-upright position if your cough is worse at night.  Rest as needed.  Stop smoking if you smoke. SEEK IMMEDIATE MEDICAL CARE IF:   You have pus in your sputum.  Your cough starts to worsen.  You cannot control your cough with suppressants and are losing sleep.  You begin coughing up blood.  You have difficulty  breathing.  You develop pain which is getting worse or is uncontrolled with medicine.  You have a fever. MAKE SURE YOU:   Understand these instructions.  Will watch your condition.  Will get help right away if you are not doing well or get worse. Document Released: 11/18/2010 Document Revised: 08/14/2011 Document Reviewed: 11/18/2010 [facility] Patient Information 2015 [place], [place]. This information is not intended to replace advice given to you by your health care provider. Make sure you discuss any questions you have with your health care provider.

## 2013-12-22 NOTE — Progress Notes (Addendum)
Subjective:  This chart was scribed for Arlyss Queen, MD by Donato Schultz, Medical Scribe. This patient was seen in Room 13 and the patient's care was started at 1:24 PM.   Patient ID: [name], male    DOB: [birth year], 48 y.o.   MRN: [id number]  HPI HPI Comments: [name] is a 48 y.o. male who presents to the [facility] complaining of constant sore throat, cough, and nasal congestion that started two days ago.  The patient states that his mucous is light-green in color.  He lists fever as an associated symptom.  He states that he has taken Tylenol which provided relief to his fever  The patient states that he has also taken Aleve Sinus and Mucinex with no relief to his symptoms.  The patient states that he has never experienced these symptoms before and he does not have a history of allergies.  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  The patient states that he quit smoking in 2001.  He denies any recent travel.  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  Past Medical History  Diagnosis Date  . Allergy   . Ulcer   . Left acetabular fracture     nonoperative around age 64   Past Surgical History  Procedure Laterality Date  . Cosmetic surgery    . Knee arthroscopy w/ meniscal repair      Both knees   Family History  Problem Relation Age of Onset  . Hyperlipidemia Mother   . Hypertension Mother   . Heart disease Father   . Hyperlipidemia Father   . Hypertension Father   . Heart disease Sister   . Hyperlipidemia Sister   . Hypertension Sister   . Hyperlipidemia Brother   . Hypertension Brother   . Diabetes Brother   . Cancer Maternal Grandmother   . Hearing loss Maternal Grandmother   . Hypertension Maternal Grandmother   . Cancer Maternal Grandfather   . Cancer Paternal Grandmother   . Heart disease Paternal Grandmother   . Hypertension Paternal Grandmother   . Cancer Paternal Grandfather   .  Diabetes Brother   . Heart disease Brother   . Hyperlipidemia Brother   . Hypertension Brother    History   Social History  . Marital Status: Single    Spouse Name: N/A    Number of Children: N/A  . Years of Education: N/A   Occupational History  . Not on file.   Social History Main Topics  . Smoking status: Former Smoker    Start date: 07/09/1981    Quit date: 07/10/1999  . Smokeless tobacco: Not on file  . Alcohol Use: No  . Drug Use: No  . Sexual Activity: No   Other Topics Concern  . Not on file   Social History Narrative  . No narrative on file   Allergies  Allergen Reactions  . Demerol [Meperidine] Anaphylaxis  . Oxycodone Rash    Review of Systems  Constitutional: Positive for fever. Negative for chills.  HENT: Positive for congestion, postnasal drip, sore throat and voice change. Negative for dental problem and sinus pressure.   Respiratory: Positive for cough.   Musculoskeletal: Negative for myalgias.  All other systems reviewed and are negative.    Objective:  Physical Exam  Nursing note and vitals reviewed. Constitutional: He is oriented to person, place, and time. He appears well-developed  and well-nourished.  Not toxic.  HENT:  Head: Normocephalic and atraumatic.  Significant yellow nasal drainage which is slightly yellow.   Throat is slightly red.  Eyes: EOM are normal.  Neck: Normal range of motion.  Cardiovascular: Normal rate, regular rhythm, normal heart sounds and intact distal pulses.  Exam reveals no gallop and no friction rub.   No murmur heard. Pulmonary/Chest: Effort normal and breath sounds normal. No respiratory distress. He has no wheezes. He has no rales.  Musculoskeletal: Normal range of motion.  Lymphadenopathy:    He has no cervical adenopathy.  Neurological: He is alert and oriented to person, place, and time.  Skin: Skin is warm and dry.  Psychiatric: He has a normal mood and affect. His behavior is normal.  [facility]  reading (PRIMARY) by  Dr. Everlene Farrier chest x-ray shows no acute disease Meds ordered this encounter  Medications  . azithromycin (ZITHROMAX) 250 MG tablet    Sig: Take 2 tabs PO x 1 dose, then 1 tab PO QD x 4 days    Dispense:  6 tablet    Refill:  0  . benzonatate (TESSALON) 100 MG capsule    Sig: Take 1-2 capsules (100-200 mg total) by mouth 3 (three) times daily as needed for cough.    Dispense:  40 capsule    Refill:  0  . fluticasone (FLONASE) 50 MCG/ACT nasal spray    Sig: Place 2 sprays into both nostrils daily.    Dispense:  16 g    Refill:  6   Results for orders placed in visit on 12/22/13  POCT RAPID STREP A (OFFICE)      Result Value Ref Range   Rapid Strep A Screen Negative  Negative    BP 122/80  Pulse 83  Temp(Src) 98.2 F (36.8 C) (Oral)  Resp 18  Ht 6\' 2"  (1.88 m)  Wt 333 lb (151.048 kg)  BMI 42.74 kg/m2  SpO2 98%  Assessment & Plan:  We'll treat for sinusitis, bronchitis. He was given [employer] along with a Z-Pak and Flonase.  I personally performed the services described in this documentation, which was scribed in my presence. The recorded information has been reviewed and is accurate.

## 2013-12-24 LAB — CULTURE, GROUP A STREP: Organism ID, Bacteria: NORMAL

## 2014-02-19 ENCOUNTER — Ambulatory Visit (INDEPENDENT_AMBULATORY_CARE_PROVIDER_SITE_OTHER): Payer: Commercial Managed Care - PPO | Admitting: Family Medicine

## 2014-02-19 ENCOUNTER — Observation Stay (HOSPITAL_COMMUNITY): Payer: Commercial Managed Care - PPO

## 2014-02-19 ENCOUNTER — Encounter: Payer: Self-pay | Admitting: Family Medicine

## 2014-02-19 ENCOUNTER — Encounter (HOSPITAL_COMMUNITY): Payer: Self-pay | Admitting: Emergency Medicine

## 2014-02-19 ENCOUNTER — Observation Stay (HOSPITAL_COMMUNITY)
Admission: EM | Admit: 2014-02-19 | Discharge: 2014-02-21 | Disposition: A | Discharge status: Home / Self Care | Payer: Commercial Managed Care - PPO | Attending: Internal Medicine | Admitting: Internal Medicine

## 2014-02-19 ENCOUNTER — Emergency Department (HOSPITAL_COMMUNITY): Payer: Commercial Managed Care - PPO

## 2014-02-19 VITALS — BP 152/80 | HR 109 | Temp 97.7°F | Resp 18 | Ht 74.0 in | Wt 341.0 lb

## 2014-02-19 DIAGNOSIS — R7309 Other abnormal glucose: Secondary | ICD-10-CM | POA: Diagnosis not present

## 2014-02-19 DIAGNOSIS — H538 Other visual disturbances: Secondary | ICD-10-CM

## 2014-02-19 DIAGNOSIS — G459 Transient cerebral ischemic attack, unspecified: Secondary | ICD-10-CM | POA: Diagnosis not present

## 2014-02-19 DIAGNOSIS — R739 Hyperglycemia, unspecified: Secondary | ICD-10-CM | POA: Diagnosis present

## 2014-02-19 DIAGNOSIS — Z7982 Long term (current) use of aspirin: Secondary | ICD-10-CM | POA: Diagnosis not present

## 2014-02-19 DIAGNOSIS — R4789 Other speech disturbances: Secondary | ICD-10-CM

## 2014-02-19 DIAGNOSIS — R079 Chest pain, unspecified: Secondary | ICD-10-CM | POA: Insufficient documentation

## 2014-02-19 DIAGNOSIS — I6529 Occlusion and stenosis of unspecified carotid artery: Secondary | ICD-10-CM | POA: Insufficient documentation

## 2014-02-19 DIAGNOSIS — R29898 Other symptoms and signs involving the musculoskeletal system: Secondary | ICD-10-CM

## 2014-02-19 DIAGNOSIS — R200 Anesthesia of skin: Secondary | ICD-10-CM

## 2014-02-19 DIAGNOSIS — R51 Headache: Secondary | ICD-10-CM

## 2014-02-19 DIAGNOSIS — Z79899 Other long term (current) drug therapy: Secondary | ICD-10-CM | POA: Diagnosis not present

## 2014-02-19 DIAGNOSIS — R519 Headache, unspecified: Secondary | ICD-10-CM

## 2014-02-19 DIAGNOSIS — R209 Unspecified disturbances of skin sensation: Secondary | ICD-10-CM

## 2014-02-19 DIAGNOSIS — Z8711 Personal history of peptic ulcer disease: Secondary | ICD-10-CM | POA: Insufficient documentation

## 2014-02-19 DIAGNOSIS — E785 Hyperlipidemia, unspecified: Secondary | ICD-10-CM | POA: Diagnosis not present

## 2014-02-19 DIAGNOSIS — Z885 Allergy status to narcotic agent status: Secondary | ICD-10-CM | POA: Insufficient documentation

## 2014-02-19 DIAGNOSIS — R29810 Facial weakness: Secondary | ICD-10-CM

## 2014-02-19 DIAGNOSIS — R4781 Slurred speech: Secondary | ICD-10-CM

## 2014-02-19 LAB — DIFFERENTIAL
Basophils Absolute: 0 10*3/uL (ref 0.0–0.1)
Basophils Relative: 0 % (ref 0–1)
Eosinophils Absolute: 0.1 10*3/uL (ref 0.0–0.7)
Eosinophils Relative: 1 % (ref 0–5)
LYMPHS ABS: 1.9 10*3/uL (ref 0.7–4.0)
LYMPHS PCT: 29 % (ref 12–46)
MONO ABS: 0.6 10*3/uL (ref 0.1–1.0)
Monocytes Relative: 9 % (ref 3–12)
Neutro Abs: 4.1 10*3/uL (ref 1.7–7.7)
Neutrophils Relative %: 61 % (ref 43–77)

## 2014-02-19 LAB — CBC
HEMATOCRIT: 45 % (ref 39.0–52.0)
HEMOGLOBIN: 14.6 g/dL (ref 13.0–17.0)
MCH: 28.4 pg (ref 26.0–34.0)
MCHC: 32.4 g/dL (ref 30.0–36.0)
MCV: 87.5 fL (ref 78.0–100.0)
Platelets: 192 10*3/uL (ref 150–400)
RBC: 5.14 MIL/uL (ref 4.22–5.81)
RDW: 13.2 % (ref 11.5–15.5)
WBC: 6.7 10*3/uL (ref 4.0–10.5)

## 2014-02-19 LAB — URINALYSIS, ROUTINE W REFLEX MICROSCOPIC
Bilirubin Urine: NEGATIVE
Glucose, UA: NEGATIVE mg/dL
HGB URINE DIPSTICK: NEGATIVE
Ketones, ur: NEGATIVE mg/dL
Leukocytes, UA: NEGATIVE
NITRITE: NEGATIVE
Protein, ur: NEGATIVE mg/dL
SPECIFIC GRAVITY, URINE: 1.024 (ref 1.005–1.030)
UROBILINOGEN UA: 0.2 mg/dL (ref 0.0–1.0)
pH: 6 (ref 5.0–8.0)

## 2014-02-19 LAB — RAPID URINE DRUG SCREEN, HOSP PERFORMED
Amphetamines: NOT DETECTED
BARBITURATES: NOT DETECTED
Benzodiazepines: NOT DETECTED
Cocaine: NOT DETECTED
OPIATES: NOT DETECTED
Tetrahydrocannabinol: NOT DETECTED

## 2014-02-19 LAB — COMPREHENSIVE METABOLIC PANEL
ALT: 28 U/L (ref 0–53)
AST: 25 U/L (ref 0–37)
Albumin: 3.7 g/dL (ref 3.5–5.2)
Alkaline Phosphatase: 49 U/L (ref 39–117)
Anion gap: 11 (ref 5–15)
BILIRUBIN TOTAL: 0.3 mg/dL (ref 0.3–1.2)
BUN: 14 mg/dL (ref 6–23)
CO2: 26 meq/L (ref 19–32)
CREATININE: 0.94 mg/dL (ref 0.50–1.35)
Calcium: 9.1 mg/dL (ref 8.4–10.5)
Chloride: 103 mEq/L (ref 96–112)
GFR calc Af Amer: >90 mL/min (ref >90)
GLUCOSE: 112 mg/dL — AB (ref 70–99)
Potassium: 3.9 mEq/L (ref 3.7–5.3)
Sodium: 140 mEq/L (ref 137–147)
Total Protein: 7.2 g/dL (ref 6.0–8.3)

## 2014-02-19 LAB — APTT: APTT: 30 s (ref 24–37)

## 2014-02-19 LAB — GLUCOSE, POCT (MANUAL RESULT ENTRY): POC GLUCOSE: 145 mg/dL — AB (ref 70–99)

## 2014-02-19 LAB — I-STAT CHEM 8, ED
BUN: 16 mg/dL (ref 6–23)
CALCIUM ION: 1.17 mmol/L (ref 1.12–1.23)
CREATININE: 0.9 mg/dL (ref 0.50–1.35)
Chloride: 104 mEq/L (ref 96–112)
GLUCOSE: 115 mg/dL — AB (ref 70–99)
HCT: 47 % (ref 39.0–52.0)
HEMOGLOBIN: 16 g/dL (ref 13.0–17.0)
Potassium: 3.7 mEq/L (ref 3.7–5.3)
Sodium: 141 mEq/L (ref 137–147)
TCO2: 22 mmol/L (ref 0–100)

## 2014-02-19 LAB — PROTIME-INR
INR: 1.07 (ref 0.00–1.49)
Prothrombin Time: 13.9 seconds (ref 11.6–15.2)

## 2014-02-19 LAB — ETHANOL: Alcohol, Ethyl (B): <11 mg/dL (ref 0–11)

## 2014-02-19 LAB — CBG MONITORING, ED: GLUCOSE-CAPILLARY: 93 mg/dL (ref 70–99)

## 2014-02-19 LAB — I-STAT TROPONIN, ED: Troponin i, poc: 0 ng/mL (ref 0.00–0.08)

## 2014-02-19 MED ORDER — LORAZEPAM 2 MG/ML IJ SOLN
0.5000 mg | Freq: Once | INTRAMUSCULAR | Status: AC
Start: 2014-02-19 — End: 2014-02-19
  Administered 2014-02-19: 0.5 mg via INTRAVENOUS
  Filled 2014-02-19: qty 1

## 2014-02-19 MED ORDER — SODIUM CHLORIDE 0.9 % IV BOLUS (SEPSIS)
1000.0000 mL | Freq: Once | INTRAVENOUS | Status: AC
  Administered 2014-02-19: 1000 mL via INTRAVENOUS

## 2014-02-19 MED ORDER — STROKE: EARLY STAGES OF RECOVERY BOOK
Freq: Once | Status: AC
Start: 2014-02-19 — End: 2014-02-19
  Administered 2014-02-19: 1
  Filled 2014-02-19: qty 1

## 2014-02-19 MED ORDER — PROCHLORPERAZINE EDISYLATE 5 MG/ML IJ SOLN
10.0000 mg | Freq: Once | INTRAMUSCULAR | Status: AC
  Administered 2014-02-19: 10 mg via INTRAVENOUS
  Filled 2014-02-19: qty 2

## 2014-02-19 MED ORDER — DEXAMETHASONE SODIUM PHOSPHATE 10 MG/ML IJ SOLN
10.0000 mg | Freq: Once | INTRAMUSCULAR | Status: AC
  Administered 2014-02-19: 10 mg via INTRAVENOUS
  Filled 2014-02-19: qty 1

## 2014-02-19 MED ORDER — SENNOSIDES-DOCUSATE SODIUM 8.6-50 MG PO TABS: 1.0000 | ORAL_TABLET | Freq: Every evening | ORAL | Status: DC | PRN

## 2014-02-19 MED ORDER — ASPIRIN 325 MG PO TABS
325.0000 mg | ORAL_TABLET | Freq: Every day | ORAL | Status: DC
  Administered 2014-02-19 – 2014-02-21 (×3): 325 mg via ORAL
  Filled 2014-02-19 (×3): qty 1

## 2014-02-19 MED ORDER — DIPHENHYDRAMINE HCL 50 MG/ML IJ SOLN
12.5000 mg | Freq: Once | INTRAMUSCULAR | Status: AC
Start: 2014-02-19 — End: 2014-02-19
  Administered 2014-02-19: 12.5 mg via INTRAVENOUS
  Filled 2014-02-19: qty 1

## 2014-02-19 MED ORDER — ASPIRIN 300 MG RE SUPP: 300.0000 mg | Freq: Every day | RECTAL | Status: DC

## 2014-02-19 MED ORDER — HEPARIN SODIUM (PORCINE) 5000 UNIT/ML IJ SOLN
5000.0000 [IU] | Freq: Three times a day (TID) | INTRAMUSCULAR | Status: DC
  Administered 2014-02-19 – 2014-02-21 (×5): 5000 [IU] via SUBCUTANEOUS
  Filled 2014-02-19 (×5): qty 1

## 2014-02-19 NOTE — Consult Note (Signed)
Referring Physician: Software engineer, E    Chief Complaint: Numbness, weakness and blurred vision.  HPI: [name] is an 48 y.o. male with a history of peptic ulcer disease, and acetabular Fx, presenting with new onset left side numbness and weakness and blurred vision. He has been experiencing a headache for 3 days, but has no Hx of migraines. CT scan of the head was unremarkable. NIH stroke score was 2. Deficits were resolving.  LSN: 1200 noon on 02/19/14 tPA Given: No: resolving deficits mRankin:  Past Medical History  Diagnosis Date  . Allergy   . Ulcer   . Left acetabular fracture     nonoperative around age 33    Family History  Problem Relation Age of Onset  . Hyperlipidemia Mother   . Hypertension Mother   . Heart disease Father   . Hyperlipidemia Father   . Hypertension Father   . Heart disease Sister   . Hyperlipidemia Sister   . Hypertension Sister   . Hyperlipidemia Brother   . Hypertension Brother   . Diabetes Brother   . Cancer Maternal Grandmother   . Hearing loss Maternal Grandmother   . Hypertension Maternal Grandmother   . Cancer Maternal Grandfather   . Cancer Paternal Grandmother   . Heart disease Paternal Grandmother   . Hypertension Paternal Grandmother   . Cancer Paternal Grandfather   . Diabetes Brother   . Heart disease Brother   . Hyperlipidemia Brother   . Hypertension Brother      Medications: I have reviewed the patient's current medications.  ROS: History obtained from the patient  General ROS: negative for - chills, fatigue, fever, night sweats, weight gain or weight loss Psychological ROS: negative for - behavioral disorder, hallucinations, memory difficulties, mood swings or suicidal ideation Ophthalmic ROS: negative for - blurry vision, double vision, eye pain or loss of vision ENT ROS: negative for - epistaxis, nasal discharge, oral lesions, sore throat, tinnitus or vertigo Allergy and Immunology ROS: negative for - hives or  itchy/watery eyes Hematological and Lymphatic ROS: negative for - bleeding problems, bruising or swollen lymph nodes Endocrine ROS: negative for - galactorrhea, hair pattern changes, polydipsia/polyuria or temperature intolerance Respiratory ROS: negative for - cough, hemoptysis, shortness of breath or wheezing Cardiovascular ROS: negative for - chest pain, dyspnea on exertion, edema or irregular heartbeat Gastrointestinal ROS: negative for - abdominal pain, diarrhea, hematemesis, nausea/vomiting or stool incontinence Genito-Urinary ROS: negative for - dysuria, hematuria, incontinence or urinary frequency/urgency Musculoskeletal ROS: negative for - joint swelling or muscular weakness Neurological ROS: as noted in HPI Dermatological ROS: negative for rash and skin lesion changes  Physical Examination: Blood pressure 133/84, pulse 102, temperature 98.1 F (36.7 C), temperature source Oral, resp. rate 19, height 6\' 2"  (1.88 m), weight 154.677 kg (341 lb), SpO2 96.00%.  Neurologic Examination: Mental Status: Alert, oriented, thought content appropriate.  Speech fluent without evidence of aphasia. Able to follow commands without difficulty. Cranial Nerves: II-Visual fields were normal. III/IV/VI-Pupils were equal and reacted. Extraocular movements were full and conjugate.    V/VII-mild left facial numbness; slight left lower facial weakness. VIII-normal. X-normal speech. Motor: 5/5 bilaterally with normal tone and bulk Sensory: Normal throughout. Deep Tendon Reflexes: 2+ and symmetric. Plantars: Flexor bilaterally Cerebellar: Normal finger-to-nose testing.  Ct Head Wo Contrast  02/19/2014   CLINICAL DATA:  48 year old male with headache, left blurred vision and left facial weakness. Code stroke.  EXAM: CT HEAD WITHOUT CONTRAST  TECHNIQUE: Contiguous axial images were obtained from the base of  the skull through the vertex without intravenous contrast.  COMPARISON:  None.  FINDINGS: No  intracranial abnormalities are identified, including mass lesion or mass effect, hydrocephalus, extra-axial fluid collection, midline shift, hemorrhage, or acute infarction.  The visualized bony calvarium is unremarkable.  IMPRESSION: Unremarkable noncontrast head CT.  Critical Value/emergent results were called by telephone at the time of interpretation on 02/19/2014 at 3:08 pm to Dr. Nicole Kindred, who verbally acknowledged these results.   Electronically Signed   By: Hassan Rowan M.D.   On: 02/19/2014 15:09    Assessment: 48 y.o. male presenting with an acute ischemic event, likely TIA or small subcortical rt CVA. Complicated migraine is less likely, but cannot be ruled out.  Stroke Risk Factors - none  Plan: 1. HgbA1c, fasting lipid panel 2. MRI, MRA  of the brain without contrast 3. PT consult, OT consult, Speech consult 4. Echocardiogram 5. Carotid dopplers 6. Prophylactic therapy-Antiplatelet med: Aspirin 325 mg daily 7. Risk factor modification 8. Telemetry monitoring   C.R. Nicole Kindred, MD [facility] [phone]  02/19/2014, 3:51 PM

## 2014-02-19 NOTE — Progress Notes (Signed)
Patient arrived to 4N31 via stretcher from ED. Patient assessed and oriented to room and unit. Telemetry placed. Patient lying in bed and call light is in reach. Will continue to monitor patient. Burnell Blanks, RN

## 2014-02-19 NOTE — Progress Notes (Signed)
   Subjective:    Patient ID: [name], male    DOB: 09/22/1965, 48 y.o.   MRN: [id number]  HPI Pt presents to clinic with 2.5h h/o fogginess, right sided facial weakness and funny feeling in mouth on the right with right leg weakness.  He is having trouble finding words and speaking the words - he states he is typically quick to respond but he is having trouble.  He has also had a left sided headache for the last 3 days that starts at his neck and runs up over his head and into the right frontal area.  It is sharp and stabbing in quality.  He has no h/o DM, HTN, quit smoking 2002.  Family h/o HTN.  Review of Systems  Neurological: Positive for headaches. Negative for dizziness.       Objective:   Physical Exam        Assessment & Plan:

## 2014-02-19 NOTE — ED Provider Notes (Signed)
48 year old male had onset about 2 PM of numbness on the left side of his face as well as some mild weakness of his left leg and some difficulty forming thoughts and some blurred vision. The symptoms have improved but he still has the numbness in his face. He states that he has been having waxing and waning right occipital and occipitoparietal headaches for about the last week. Headache is worse with light exposure. There's been no nausea vomiting. He was brought to the ED as a code stroke. He has been evaluated by neurology who feels that this may be a migraine variant and may be a TIA. On exam, fundi are normal. There is an area on the left cheek which has decreased pinprick sensation but no other areas of decreased sensation. Motor exam is completely normal. He is being admitted for her TIA workup has also been treated for possible migraine headache with IV fluids, prochlorperazine, diphenhydramine, and dexamethasone.  I saw and evaluated the patient, reviewed the resident's note and I agree with the findings and plan.   EKG Interpretation   Date/Time:  Thursday February 19 2014 15:07:54 EDT Ventricular Rate:  108 PR Interval:  195 QRS Duration: 102 QT Interval:  333 QTC Calculation: 446 R Axis:   30 Text Interpretation:  Age not entered, assumed to be  48 years old for  purpose of ECG interpretation Sinus tachycardia Otherwise within normal  limits When compared with ECG of 07/20/2010, HEART RATE has increased  Confirmed by Roxanne Mins  MD, Jakyrie Totherow ([id number]) on 02/19/2014 3:32:51 PM        Delora Fuel, MD 88/50/27 [id number]

## 2014-02-19 NOTE — Code Documentation (Signed)
48yo male arriving to [facility] via [place] at 66.  EMS reports that patient had sudden onset left facial numbness, difficulty speaking and blurred vision in the left eye at 1200.  Patient has had a headache x3 days.  Patient went to an urgent care where EMS was activated and called a Code Stroke.  Patient cleared at the bridge by EDP and taken to CT which was viewed by Dr. Nicole Kindred.  Patient back to the room where initial NIHSS 2, see documentation for details and code stroke times.  Patient reports that he has been taking Bayer and Aleve.  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  He reports left sided weakness when walking.  Patient with continued numbness and mild facial asymmetry.  Patient with subjective left sided weakness and reports that he has to concentrate to speak/read words/phrases.  Dr. Nicole Kindred at the bedside. Patient is too mild to treat at this time.  Patient remains in the window to treat with tPA until 1630 should symptoms worsen.  Patient to be admitted.  Bedside handoff with ED RN Santiago Glad.

## 2014-02-19 NOTE — ED Provider Notes (Signed)
CSN: [id number]     Arrival date & time 02/19/14  1446 History   First MD Initiated Contact with Patient 02/19/14 1504     Chief Complaint  Patient presents with  . Code Stroke     Patient is a 48 y.o. male presenting with headaches. The history is provided by the patient and the EMS personnel.  Headache Pain location:  Occipital and R temporal Radiates to:  L neck Onset quality:  Gradual Duration:  3 days Progression:  Worsening Chronicity:  New Similar to prior headaches: no (has no hx of migraines)   Context: not exposure to bright light, not eating and not loud noise   Relieved by:  Nothing Associated symptoms: blurred vision (b/l) and numbness (right face)   Associated symptoms: no abdominal pain, no back pain, no dizziness, no fever, no focal weakness, no hearing loss, no loss of balance, no nausea, no near-syncope, no neck stiffness, no paresthesias, no photophobia, no seizures, no syncope, no vomiting and no weakness   Risk factors: no family hx of SAH    No recent illness. No significant medical history.  Onset of blurred vision, fogginess and weakness/numbness started 3 hrs PTA  Past Medical History  Diagnosis Date  . Allergy   . Ulcer   . Left acetabular fracture     nonoperative around age 39   Past Surgical History  Procedure Laterality Date  . Cosmetic surgery    . Knee arthroscopy w/ meniscal repair      Both knees   Family History  Problem Relation Age of Onset  . Hyperlipidemia Mother   . Hypertension Mother   . Heart disease Father   . Hyperlipidemia Father   . Hypertension Father   . Heart disease Sister   . Hyperlipidemia Sister   . Hypertension Sister   . Hyperlipidemia Brother   . Hypertension Brother   . Diabetes Brother   . Cancer Maternal Grandmother   . Hearing loss Maternal Grandmother   . Hypertension Maternal Grandmother   . Cancer Maternal Grandfather   . Cancer Paternal Grandmother   . Heart disease Paternal Grandmother   .  Hypertension Paternal Grandmother   . Cancer Paternal Grandfather   . Diabetes Brother   . Heart disease Brother   . Hyperlipidemia Brother   . Hypertension Brother    History  Substance Use Topics  . Smoking status: Former Smoker    Start date: 07/09/1981    Quit date: 07/10/1999  . Smokeless tobacco: Not on file  . Alcohol Use: No    Review of Systems  Constitutional: Negative for fever.  HENT: Negative for hearing loss.   Eyes: Positive for blurred vision (b/l). Negative for photophobia.  Cardiovascular: Negative for syncope and near-syncope.  Gastrointestinal: Negative for nausea, vomiting and abdominal pain.  Musculoskeletal: Negative for back pain and neck stiffness.  Neurological: Positive for weakness (right leg), numbness (right face) and headaches. Negative for dizziness, focal weakness, seizures, paresthesias and loss of balance.  All other systems reviewed and are negative.   Allergies  Demerol and Oxycodone  Home Medications   Prior to Admission medications   Not on File   BP 129/76  Pulse 106  Temp(Src) 98.1 F (36.7 C) (Oral)  Resp 20  Ht 6\' 2"  (1.88 m)  Wt 341 lb (154.677 kg)  BMI 43.76 kg/m2  SpO2 98% Physical Exam  Constitutional: He appears well-developed and well-nourished. No distress.  HENT:  Head: Normocephalic and atraumatic.  Nose: Nose normal.  Eyes: Conjunctivae are normal. Pupils are equal, round, and reactive to light.  Neck: Normal range of motion. Neck supple. No tracheal deviation present.  No nuchal rigidity  Cardiovascular: Normal rate, regular rhythm, normal heart sounds and intact distal pulses.   No murmur heard. Pulmonary/Chest: Effort normal and breath sounds normal. No respiratory distress. He has no rales.  Abdominal: Soft. Bowel sounds are normal. He exhibits no distension and no mass. There is no tenderness. There is no guarding.  Musculoskeletal: Normal range of motion. He exhibits no edema and no tenderness.   Neurological:  Alert and oriented x3. CN 3-12 tested and without deficit with exception of mild left face numbness and weakness. 5/5 muscle strength in all extremities with flexion and extension.  Normal bulk and tone.  No sensory deficit to light touch. Symmetric and equal 2+ patellar and brachioradialis DTRs.  No pronator drift.  Normal finger-to-nose.  Toes flexor bilaterally.   Skin: Skin is warm. No rash noted.    ED Course  Procedures (including critical care time) Labs Review Labs Reviewed  COMPREHENSIVE METABOLIC PANEL - Abnormal; Notable for the following:    Glucose, Bld 112 (*)    All other components within normal limits  I-STAT CHEM 8, ED - Abnormal; Notable for the following:    Glucose, Bld 115 (*)    All other components within normal limits  ETHANOL  PROTIME-INR  APTT  CBC  DIFFERENTIAL  URINE RAPID DRUG SCREEN (HOSP PERFORMED)  URINALYSIS, ROUTINE W REFLEX MICROSCOPIC  I-STAT TROPOININ, ED  I-STAT TROPOININ, ED  CBG MONITORING, ED    Imaging Review Ct Head Wo Contrast  02/19/2014   CLINICAL DATA:  48 year old male with headache, left blurred vision and left facial weakness. Code stroke.  EXAM: CT HEAD WITHOUT CONTRAST  TECHNIQUE: Contiguous axial images were obtained from the base of the skull through the vertex without intravenous contrast.  COMPARISON:  None.  FINDINGS: No intracranial abnormalities are identified, including mass lesion or mass effect, hydrocephalus, extra-axial fluid collection, midline shift, hemorrhage, or acute infarction.  The visualized bony calvarium is unremarkable.  IMPRESSION: Unremarkable noncontrast head CT.  Critical Value/emergent results were called by telephone at the time of interpretation on 02/19/2014 at 3:08 pm to Dr. Nicole Kindred, who verbally acknowledged these results.   Electronically Signed   By: Hassan Rowan M.D.   On: 02/19/2014 15:09     EKG Interpretation   Date/Time:  Thursday February 19 2014 15:07:54 EDT Ventricular  Rate:  108 PR Interval:  195 QRS Duration: 102 QT Interval:  333 QTC Calculation: 446 R Axis:   30 Text Interpretation:  Age not entered, assumed to be  48 years old for  purpose of ECG interpretation Sinus tachycardia Otherwise within normal  limits When compared with ECG of 07/20/2010, HEART RATE has increased  Confirmed by [facility]  MD, DAVID ([id number]) on 02/19/2014 3:32:51 PM      MDM   Final diagnoses:  Transient cerebral ischemia, unspecified transient cerebral ischemia type    CODE STROKE. Co-managed with neurology.   FSBG wnl. Neuro exam with decreased sensation to left face.  No hx of HA or migraines. CT normal. Sx improved throughout ED course. Labs unremarkable.  Vitals remained stable.  TIA workup admit per neurology.  Medications  prochlorperazine (COMPAZINE) injection 10 mg (not administered)  diphenhydrAMINE (BENADRYL) injection 12.5 mg (not administered)  dexamethasone (DECADRON) injection 10 mg (not administered)  sodium chloride 0.9 % bolus 1,000 mL (not administered)  Tammy Sours, MD 02/19/14 [phone]

## 2014-02-19 NOTE — ED Notes (Signed)
Pt's roommate called, Juanita and informed pt is in the hospital per pt request.

## 2014-02-19 NOTE — ED Notes (Signed)
Did cbg it was 103 notified RN of blood sugar

## 2014-02-19 NOTE — Progress Notes (Addendum)
   Subjective:    Patient ID: [name], male    DOB: 1965-09-13, 48 y.o.   MRN: [id number]  HPI [name] is a 48 y.o. male  Pt pulled acutely from waiting room and provider pulled to see patient acutely due to possible stroke.   See history per [name] note. In brief - 3 day history of R sided headache that radiated to occipital headache, took Bayer back and body #2 last night. Starting at 12 pm today - noticed difficulty wit speech/forming words, l eye blurry and weakness in L face movements.   No known chronic medical problems, no rx meds. No personal hx of  CVD/CAD. Family history of "heart disease and HTN"  Review of Systems  HENT: Negative for voice change.   Eyes: Positive for visual disturbance.  Neurological: Positive for facial asymmetry, speech difficulty, weakness and headaches.       Objective:   Physical Exam  Constitutional: He appears well-developed and well-nourished. No distress.  Appears anxious, but no acute distress.   HENT:  Head: Normocephalic and atraumatic.  Mouth/Throat: Oropharynx is clear and moist.  Equal palate elevation.   Cardiovascular: Normal rate, regular rhythm, normal heart sounds and intact distal pulses.   No extrasystoles are present.  No murmur heard. Pulmonary/Chest: Effort normal and breath sounds normal.  Neurological: He is alert. He displays no tremor. No sensory deficit. He displays no seizure activity. GCS eye subscore is 4. GCS verbal subscore is 5. GCS motor subscore is 6.  Slight decreased strength on L face with blink and slight discrepancy of L mouth with smile.  Air leaks on left with closed mouth puff.   difficulty with "You can't teach an old dog new tricks"   Seated exam.   Skin: Skin is warm and dry. No rash noted.  Psychiatric: He has a normal mood and affect. His behavior is normal.   Filed Vitals:   02/19/14 1423  BP: 152/80  Pulse: 109  Temp: 97.7 F (36.5 C)  Resp: 18  Height: 6\' 2"  (1.88 m)    Weight: 341 lb (154.677 kg)  SpO2: 97%   Results for orders placed in visit on 02/19/14  GLUCOSE, POCT (MANUAL RESULT ENTRY)      Result Value Ref Range   POC Glucose 145 (*) 70 - 99 mg/dl        Assessment & Plan:   [name] is a 48 y.o. male Numbness - Plan: POCT glucose (manual entry), sodium chloride 0.9 % bolus 1,000 mL  Weakness of face muscles  Slurred speech  Headache, occipital  Blurry vision, left eye  As above, preceding headache for 3 days with acute L facial weakness, slurred speech/speech difficulty and blurry vision starting at approximately 12pm.   -EMS called emergent for possible code stroke at 1418  -IV access obtained, normal saline, O2 [place] at 2 L [place]  -1428: report given to EMS, transfer of care and advised charge nurse at [facility].   Meds ordered this encounter  Medications  . sodium chloride 0.9 % bolus 1,000 mL    Sig:    There are no Patient Instructions on file for this visit.

## 2014-02-19 NOTE — ED Notes (Addendum)
To ED via [facility] from urgent care as a code stroke-- pt alert -oriented on arrival. To CT scan on arrival.

## 2014-02-19 NOTE — ED Provider Notes (Signed)
MSE was initiated and I personally evaluated the patient and placed orders (if any) at  2:49 PM on February 19, 2014.  The patient appears stable so that the remainder of the MSE may be completed by another provider.   Patient presented as a code stroke. Onset of left-sided facial numbness and blurry vision proximally noon today. Patient reports 3 day history of headache. No history of hypertension, hypercholesterolemia, prior strokes. No history of migraines. ABCs are intact upon arrival. Patient cleared for CT scan.  Merryl Hacker, MD 02/19/14 1450

## 2014-02-19 NOTE — H&P (Signed)
Date: 02/19/2014               Patient Name:  [name] MRN: [id number]  DOB: [birth year] Age / Sex: 48 y.o., male   PCP: No Pcp Per Patient         Medical Service: Internal Medicine Teaching Service         Attending Physician: Dr. Bartholomew Crews, MD    First Contact: Dr. Albin Felling Pager: [phone]  Second Contact: Dr. Clayburn Pert Pager: [phone]       After Hours (After 5p/  First Contact Pager: [phone]  weekends / holidays): Second Contact Pager: [phone]   Chief Complaint: Left facial numbness, left sided blurry vision  History of Present Illness: [name] is a 48yo man w/ PMHx of peptic ulcer disease and history of left acetabular fracture in 2005 who presented to the ED with new onset left-sided facial numbness and blurry vision. Patient states around 12 PM today, he started to have left facial numbness in his cheek and mouth. He also noted blurry vision in his left eye and weakness of his left leg around the same time. He denies numbness in his tongue, but notes he has had a "waxy" taste for the last 3 days as well as decreased taste. Patient states he has difficulty concentrating. Patient feels that his speech is not slurred, but that he is speaking slower compared to normal. The symptoms are still occurring at this time. Patient also reports a 3 day history of headaches that are located in the right occipital/temporal region. He denies a history of migraines or headaches. He has tried Aleve and Retail banker with minimal relief. He denies visual changes when the headaches first started. He states the headaches are worse with light.   In the ED, patient was code stroke. CT head was negative for acute stroke.  Meds: No current facility-administered medications for this encounter.   Current Outpatient Prescriptions  Medication Sig Dispense Refill  . Aspirin-Caffeine (BAYER BACK & BODY PAIN EX ST) 500-32.5 MG TABS Take 2 tablets by mouth once.      . naproxen sodium  (ALEVE) 220 MG tablet Take 440 mg by mouth once.      Marland Kitchen oxymetazoline (AFRIN) 0.05 % nasal spray Place 2 sprays into both nostrils daily as needed for congestion.        Allergies: Allergies as of 02/19/2014 - Review Complete 02/19/2014  Allergen Reaction Noted  . Demerol [meperidine] Anaphylaxis 07/09/2012  . Oxycodone Rash 07/09/2012   Past Medical History  Diagnosis Date  . Allergy   . Ulcer   . Left acetabular fracture     nonoperative around age 57   Past Surgical History  Procedure Laterality Date  . Cosmetic surgery    . Knee arthroscopy w/ meniscal repair      Both knees   Family History  Problem Relation Age of Onset  . Hyperlipidemia Mother   . Hypertension Mother   . Heart disease Father   . Hyperlipidemia Father   . Hypertension Father   . Heart disease Sister   . Hyperlipidemia Sister   . Hypertension Sister   . Hyperlipidemia Brother   . Hypertension Brother   . Diabetes Brother   . Cancer Maternal Grandmother   . Hearing loss Maternal Grandmother   . Hypertension Maternal Grandmother   . Cancer Maternal Grandfather   . Cancer Paternal Grandmother   . Heart disease Paternal Grandmother   . Hypertension Paternal  Grandmother   . Cancer Paternal Grandfather   . Diabetes Brother   . Heart disease Brother   . Hyperlipidemia Brother   . Hypertension Brother    History   Social History  . Marital Status: Single    Spouse Name: N/A    Number of Children: N/A  . Years of Education: N/A   Occupational History  . Not on file.   Social History Main Topics  . Smoking status: Former Smoker    Start date: 07/09/1981    Quit date: 07/10/1999  . Smokeless tobacco: Not on file  . Alcohol Use: No  . Drug Use: No  . Sexual Activity: No   Other Topics Concern  . Not on file   Social History Narrative  . No narrative on file    Review of Systems: General: Denies fever, chills, night sweats, changes in weight, changes in appetite HEENT: Denies ear  pain, rhinorrhea, sore throat CV: Denies CP, palpitations, SOB, orthopnea Pulm: Denies SOB, cough, wheezing GI: Denies abdominal pain, nausea, vomiting, diarrhea, constipation, melena, hematochezia GU: Denies dysuria, hematuria, frequency Msk: Denies muscle cramps, joint pains Neuro: See HPI Skin: Denies rashes, bruising  Physical Exam: Blood pressure 132/77, pulse 96, temperature 98.1 F (36.7 C), temperature source Oral, resp. rate 21, height 6\' 2"  (1.88 m), weight 341 lb (154.677 kg), SpO2 96.00%. General: sitting up in chair, alert, NAD HEENT: [place]/AT, EOMI, PERRL, sclera anicteric, pharynx non-erythematous, mucus membranes moist Neck: supple, no JVD, no lymphadenopathy CV: mildly tachycardic, normal S1/S2, no m/g/r Pulm: CTA bilaterally, breaths non-labored, no wheezing Abd: BS+, soft, non-distended, non-tender Ext: warm, no edema, moves all Neuro: alert and oriented x 3, speech fluent without aphasia, follows commands appropriately. Mild facial numbness. Difficulty with smiling. Strength 5/5 in upper and lower extremities bilaterally  Lab results: Basic Metabolic Panel:  Recent Labs  02/19/14 1449 02/19/14 1454  NA 140 141  K 3.9 3.7  CL 103 104  CO2 26  --   GLUCOSE 112* 115*  BUN 14 16  CREATININE 0.94 0.90  CALCIUM 9.1  --    Liver Function Tests:  Recent Labs  02/19/14 1449  AST 25  ALT 28  ALKPHOS 49  BILITOT 0.3  PROT 7.2  ALBUMIN 3.7   No results found for this basename: LIPASE, AMYLASE,  in the last 72 hours No results found for this basename: AMMONIA,  in the last 72 hours CBC:  Recent Labs  02/19/14 1449 02/19/14 1454  WBC 6.7  --   NEUTROABS 4.1  --   HGB 14.6 16.0  HCT 45.0 47.0  MCV 87.5  --   PLT 192  --    Cardiac Enzymes: No results found for this basename: CKTOTAL, CKMB, CKMBINDEX, TROPONINI,  in the last 72 hours BNP: No results found for this basename: PROBNP,  in the last 72 hours D-Dimer: No results found for this basename:  DDIMER,  in the last 72 hours CBG:  Recent Labs  02/19/14 1506  GLUCAP 93   Hemoglobin A1C: No results found for this basename: HGBA1C,  in the last 72 hours Fasting Lipid Panel: No results found for this basename: CHOL, HDL, LDLCALC, TRIG, CHOLHDL, LDLDIRECT,  in the last 72 hours Thyroid Function Tests: No results found for this basename: TSH, T4TOTAL, FREET4, T3FREE, THYROIDAB,  in the last 72 hours Anemia Panel: No results found for this basename: VITAMINB12, FOLATE, FERRITIN, TIBC, IRON, RETICCTPCT,  in the last 72 hours Coagulation:  Recent Labs  02/19/14  1449  LABPROT 13.9  INR 1.07   Urine Drug Screen: Drugs of Abuse     Component Value Date/Time   LABOPIA NONE DETECTED 02/19/2014 1604   COCAINSCRNUR NONE DETECTED 02/19/2014 1604   LABBENZ NONE DETECTED 02/19/2014 1604   AMPHETMU NONE DETECTED 02/19/2014 1604   THCU NONE DETECTED 02/19/2014 1604   LABBARB NONE DETECTED 02/19/2014 1604    Alcohol Level:  Recent Labs  02/19/14 1449  ETH <11   Urinalysis:  Recent Labs  02/19/14 [place]  LABSPEC 1.024  PHURINE 6.0  GLUCOSEU NEGATIVE  HGBUR NEGATIVE  BILIRUBINUR NEGATIVE  KETONESUR NEGATIVE  PROTEINUR NEGATIVE  UROBILINOGEN 0.2  NITRITE NEGATIVE  LEUKOCYTESUR NEGATIVE    Imaging results:  Ct Head Wo Contrast  02/19/2014   CLINICAL DATA:  48 year old male with headache, left blurred vision and left facial weakness. Code stroke.  EXAM: CT HEAD WITHOUT CONTRAST  TECHNIQUE: Contiguous axial images were obtained from the base of the skull through the vertex without intravenous contrast.  COMPARISON:  None.  FINDINGS: No intracranial abnormalities are identified, including mass lesion or mass effect, hydrocephalus, extra-axial fluid collection, midline shift, hemorrhage, or acute infarction.  The visualized bony calvarium is unremarkable.  IMPRESSION: Unremarkable noncontrast head CT.  Critical Value/emergent results were called by telephone at the  time of interpretation on 02/19/2014 at 3:08 pm to Dr. Nicole Kindred, who verbally acknowledged these results.   Electronically Signed   By: Hassan Rowan M.D.   On: 02/19/2014 15:09    Other results: EKG: sinus tachycardia, no ST elevations or depression, no T wave inversions  Assessment & Plan: [name] is a 48yo man w/ PMHx of peptic ulcer disease and history of left acetabular fracture in 2005 who presented to the ED with new onset left-sided facial numbness and blurry vision.  1. Possible TIA: Patient with new onset left facial numbness, left blurry vision, and left leg weakness. Pt also with 3 day history of right sided headaches in occipital/temporal region. CT head negative. Vital signs stable on admission. Pt with slower speech and left sided facial numbness and difficulty smiling on exam. Strength 5/5 in upper and lower extremities bilaterally. Neurology evaluated patient and believe patient to have TIA vs. Small subcortical right CVA vs. Complicated migraine. Will do full stroke/TIA work up. - Neurology following, appreciate recommendations - MRI/MRA Head without contrast - Echocardiogram - Carotid dopplers - PT/OT/SLP consults - Aspirin 325 mg daily - HbA1c - Lipid panel - Cardiac monitoring - Neuro checks Q2H  Diet: NPO DVT/PE PPx: Heparin SQ Dispo: Disposition is deferred at this time, awaiting improvement of current medical problems. Anticipated discharge in approximately 1-2 day(s).   The patient does not have a current PCP (No Pcp Per Patient) and does need an [facility] hospital follow-up appointment after discharge.  The patient does not have transportation limitations that hinder transportation to clinic appointments.  Signed: Albin Felling, MD 02/19/2014, 6:04 PM

## 2014-02-20 DIAGNOSIS — R51 Headache: Secondary | ICD-10-CM

## 2014-02-20 DIAGNOSIS — I517 Cardiomegaly: Secondary | ICD-10-CM

## 2014-02-20 DIAGNOSIS — E785 Hyperlipidemia, unspecified: Secondary | ICD-10-CM

## 2014-02-20 LAB — LIPID PANEL
CHOL/HDL RATIO: 4 ratio
Cholesterol: 192 mg/dL (ref 0–200)
HDL: 48 mg/dL (ref >39)
LDL Cholesterol: 134 mg/dL — ABNORMAL HIGH (ref 0–99)
Triglycerides: 48 mg/dL (ref <150)
VLDL: 10 mg/dL (ref 0–40)

## 2014-02-20 LAB — HEMOGLOBIN A1C
Hgb A1c MFr Bld: 6.3 % — ABNORMAL HIGH (ref <5.7)
Mean Plasma Glucose: 134 mg/dL — ABNORMAL HIGH (ref <117)

## 2014-02-20 MED ORDER — IBUPROFEN 600 MG PO TABS
600.0000 mg | ORAL_TABLET | Freq: Once | ORAL | Status: AC
  Administered 2014-02-20: 600 mg via ORAL
  Filled 2014-02-20: qty 1

## 2014-02-20 MED ORDER — ACETAMINOPHEN 325 MG PO TABS
650.0000 mg | ORAL_TABLET | ORAL | Status: DC | PRN
  Administered 2014-02-20: 650 mg via ORAL
  Filled 2014-02-20: qty 2

## 2014-02-20 MED ORDER — ATORVASTATIN CALCIUM 40 MG PO TABS
40.0000 mg | ORAL_TABLET | Freq: Every day | ORAL | Status: DC
  Administered 2014-02-20: 40 mg via ORAL
  Filled 2014-02-20: qty 1

## 2014-02-20 NOTE — Progress Notes (Signed)
STROKE TEAM PROGRESS NOTE   HISTORY [name] is an 48 y.o. male with a history of peptic ulcer disease, and acetabular Fx, presenting with new onset left side numbness and weakness and blurred vision. He has been experiencing a headache for 3 days, but has no Hx of migraines. CT scan of the head was unremarkable. NIH stroke score was 2. Deficits were resolving.   Patient was not administered TPA secondary to low NIH stroke scale. He was admitted to Medicine for further evaluation and treatment.   SUBJECTIVE (INTERVAL HISTORY) His exam is at the bedside.  Overall he feels his condition is improved. He was at work when he acutely felt his left face go numb and eye go blurry in the setting of headache. Denies any diplopia or vision loss. Headache for 3-4 days, stabbing pain in the right occipital area. Denies any symptoms in his extremities.    OBJECTIVE Temp:  [97.7 F (36.5 C)-98.8 F (37.1 C)] 97.9 F (36.6 C) (09/18 1428) Pulse Rate:  [78-105] 102 (09/18 1428) Cardiac Rhythm:  [-] Normal sinus rhythm (09/18 0952) Resp:  [16-23] 18 (09/18 1428) BP: (105-132)/(66-86) 125/73 mmHg (09/18 1428) SpO2:  [93 %-97 %] 94 % (09/18 1428) Weight:  [338 lb (153.316 kg)] 338 lb (153.316 kg) (09/17 2003)   Recent Labs Lab 02/19/14 1506  GLUCAP 93    Recent Labs Lab 02/19/14 1449 02/19/14 1454  NA 140 141  K 3.9 3.7  CL 103 104  CO2 26  --   GLUCOSE 112* 115*  BUN 14 16  CREATININE 0.94 0.90  CALCIUM 9.1  --     Recent Labs Lab 02/19/14 1449  AST 25  ALT 28  ALKPHOS 49  BILITOT 0.3  PROT 7.2  ALBUMIN 3.7    Recent Labs Lab 02/19/14 1449 02/19/14 1454  WBC 6.7  --   NEUTROABS 4.1  --   HGB 14.6 16.0  HCT 45.0 47.0  MCV 87.5  --   PLT 192  --    No results found for this basename: CKTOTAL, CKMB, CKMBINDEX, TROPONINI,  in the last 168 hours  Recent Labs  02/19/14 1449  LABPROT 13.9  INR 1.07    Recent Labs  02/19/14 1604  COLORURINE YELLOW  LABSPEC  1.024  PHURINE 6.0  GLUCOSEU NEGATIVE  HGBUR NEGATIVE  BILIRUBINUR NEGATIVE  KETONESUR NEGATIVE  PROTEINUR NEGATIVE  UROBILINOGEN 0.2  NITRITE NEGATIVE  LEUKOCYTESUR NEGATIVE       Component Value Date/Time   CHOL 192 02/20/2014 0610   TRIG 48 02/20/2014 0610   HDL 48 02/20/2014 0610   CHOLHDL 4.0 02/20/2014 0610   VLDL 10 02/20/2014 0610   LDLCALC 134* 02/20/2014 0610   Lab Results  Component Value Date   HGBA1C 6.3* 02/20/2014      Component Value Date/Time   LABOPIA NONE DETECTED 02/19/2014 1604   COCAINSCRNUR NONE DETECTED 02/19/2014 1604   LABBENZ NONE DETECTED 02/19/2014 1604   AMPHETMU NONE DETECTED 02/19/2014 1604   THCU NONE DETECTED 02/19/2014 1604   LABBARB NONE DETECTED 02/19/2014 1604     Recent Labs Lab 02/19/14 1449  ETH <11    Ct Head Wo Contrast  02/19/2014   IMPRESSION: Unremarkable noncontrast head CT.    Mr Brain Wo Contrast/ Mra Head/brain Wo Cm  02/19/2014   IMPRESSION: MRI HEAD :  Exam is limited to diffusion sequence and sagittal T1 sequence. Patient requested exam be terminated at this point.  No acute infarct.  No obvious intracranial hemorrhage  or intracranial mass noted on this limited exam.  MRA HEAD:  Anterior circulation without medium or large size vessel significant stenosis or occlusion.  Diminutive size right vertebral artery which appears to end in a posterior inferior cerebellar artery distribution. Narrowing and irregularity of the right vertebral artery and right posterior inferior cerebellar artery.   2D echo  - Normal biventricular size and function. Abnormal relaxation. Normal filling pressures.  CUS pending   PHYSICAL EXAM  Physical exam: Exam: Gen: NAD, conversant Eyes: anicteric sclerae, moist conjunctivae                     CV: RRR, no MRG Abdomen: Obese Psych: Appropriate affect, pleasant MSK: Tenderness to palpation right occipito-cervical areas  Neuro: Detailed Neurologic Exam  Speech:    Speech is normal; fluent  and spontaneous with normal comprehension.  Cognition:    The patient is oriented to person, place, and time; memory intact; language fluent; normal attention, concentration, and fund of knowledge.  Cranial Nerves:    The pupils are equal, round, and reactive to light. Visual fields sull to finger counting.  Extraocular movements are intact. Decreased sensation left cheek area to light touch. The face is symmetric. Voice is normal.  Coordination:    Normal finger to nose  Motor Observation:    No asymmetry, no atrophy, and no involuntary movements noted. Strength intact.    ASSESSMENT/PLAN  [name] is a 48 y.o. male with history of PUD, former smoker presenting with left facial numbness. He did not receive IV t-PA due to low NIH scale. Imaging normal. Stroke work up underway.  TIA vs Cervicogenic or Complicated migraine   Pending carotid doppler  LDL134, statin(Lipitor) necessary to meet goal of LDL <100  HgbA1c 6.3 (elevated at increased risk for diabetes)  Heparin for VTE prophylaxis   Activity as tolerated  Ongoing aggressive risk factor management  Risk factor education  ASA81 mg daily recommended for stroke prevention  Disposition:  Home  Consider fioricet PRN for headache but avoid long term use.  Hypertension   SBP goal normotensive  Hyperlipidemia  LDL 134  LDL goal < 100 (<70 for diabetics)  On lipitor 40mg . Please continue for stroke prevention.  Diabetes  HgbA1c 6.3, at increased risk. Diet and exercise. Follow up with primary care outpatient  educated patient about lifestyle changes for diabetes  prevention  Other Stroke Risk Factors Cigarette smoker, quit 2001   Obesity, Body mass index is 43.38 kg/(m^2).  Needs screen for OSA as outpt   Neurology will sign off. Please call with further questions. Thanks for the consult  Rosalin Hawking, MD PhD Stroke Neurology 02/20/2014 7:07 PM  To contact Stroke Continuity provider, please refer to  [email or web address] After hours, contact General Neurology

## 2014-02-20 NOTE — Progress Notes (Signed)
  Date: 02/20/2014  Patient name: [name]  Medical record number: [id number]  Date of birth: 03-Mar-1966   I have seen and evaluated [name] and discussed their care with the Residency Team. [name]'s PMHX includes peptic ulcer disease and history of left acetabular fracture in 2005 when a refrigerator fell on him. 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 He had new onset left-sided facial numbness of cheek and mouth, blurry vision in his left eye, and insignificant weakness of his left leg when walking up stairs. All systems started about noon on day of admission. He has had a "waxy" taste for the last 3 days as well as decreased taste. He is having trouble forming what he wants to say in his head although he is able to get the words out OK. He has never had this issue and he does public speaking.   Patient also reports a 3 day history of headaches that are located in the right occipital/temporal region. He denies a history of migraines or headaches. He has tried Aleve and Retail banker with minimal relief. He denies visual changes when the headaches first started. He states the headaches are worse with light.   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 That coupled with leaving the services, resulted in weight gain..   On exam, he is in NAD. Lights are off in the room. He has altered sensation of V2 and V3 on L. Otherwise, cranial nerves are nl. Strength 5/5 B in all muscles grps. Speech fluent. Increased ABD girth.  LDL 134 Cr 0.9 CT and MRI nl except Diminutive size right vertebral artery which appears to end in a posterior inferior cerebellar artery distribution. Narrowing and irregularity of the right vertebral artery and right posterior inferior cerebellar artery.   Assessment and Plan: I have seen and evaluated the patient as outlined above. I agree with the formulated Assessment and Plan as detailed  in the residents' admission note, with the following changes:   1. TIA - Cont ASA and start statin with goal < 100, less than 70 if possible. Will need outpt speech tx. It is concerning that not all sxs have resolved in light of nl MRI. So either infarct was so diminuative that could not be seen or sxs are slow to resolve.  Bartholomew Crews, MD 9/18/20155:14 PM

## 2014-02-20 NOTE — Evaluation (Signed)
Physical Therapy Evaluation Patient Details Name: [name] MRN: [id number] DOB: [birth year] Today's Date: 02/20/2014   History of Present Illness  Pt presented to the ED with new onset left-sided facial numbness and blurry vision. Patient states around 12 PM today, he started to have left facial numbness in his cheek and mouth. He also noted blurry vision in his left eye and weakness of his left leg around the same time. He denies numbness in his tongue, but notes he has had a "waxy" taste for the last 3 days as well as decreased taste. Patient states he has difficulty concentrating. Patient feels that his speech is not slurred, but that he is speaking slower compared to normal. The symptoms are still occurring at this time. Patient also reports a 3 day history of headaches that are located in the right occipital/temporal region. He denies a history of migraines or headaches. He has tried Aleve and Retail banker with minimal relief. He denies visual changes when the headaches first started. He states the headaches are worse with light.  Clinical Impression  Pt admitted with/for s/s of stroke.  Pt currently limited functionally due to the problems listed below.  (see problems list.)  Pt will benefit from PT to maximize function and safety to be able to get home safely with available assist of family/friends.     Follow Up Recommendations Outpatient PT;Other (comment) (if s/s continue-)    Equipment Recommendations  None recommended by PT    Recommendations for Other Services       Precautions / Restrictions Precautions Precautions: Fall      Mobility  Bed Mobility Overal bed mobility: Modified Independent                Transfers Overall transfer level: Modified independent                  Ambulation/Gait Ambulation/Gait assistance: Supervision Ambulation Distance (Feet): 500 Feet Assistive device: None Gait Pattern/deviations: Step-through pattern   Gait  velocity interpretation: at or above normal speed for age/gender General Gait Details: Steady during straightline gait without scanning, otherwise scanning and turns produced instability.  Stairs Stairs: Yes Stairs assistance: Supervision Stair Management: One rail Left;Alternating pattern;Forwards Number of Stairs: 6 General stair comments: steady with rail;  turn produced a little unsteadiness.  Wheelchair Mobility    Modified Rankin (Stroke Patients Only)       Balance Overall balance assessment: Needs assistance   Sitting balance-Leahy Scale: Good     Standing balance support: No upper extremity supported Standing balance-Leahy Scale: Good               High level balance activites: Turns High Level Balance Comments: Pt with LOB min (A) with turning head LT. PT is fall risk. pt educated on fixating eyes on object then turning body Standardized Balance Assessment Standardized Balance Assessment : Dynamic Gait Index   Dynamic Gait Index Level Surface: Normal Change in Gait Speed: Normal Gait with Horizontal Head Turns: Mild Impairment Gait with Vertical Head Turns: Mild Impairment Gait and Pivot Turn: Mild Impairment Step Over Obstacle: Normal Step Around Obstacles: Normal Steps: Mild Impairment Total Score: 20       Pertinent Vitals/Pain Pain Assessment: 0-10 Pain Score:  (does not rate, but says "bad") Pain Location: HA- reports posterior aspect of skull, light sensitive Pain Intervention(s): Repositioned    Home Living Family/patient expects to be discharged to:: Private residence Living Arrangements: Other (Comment) Available Help at Discharge: Available PRN/intermittently  Type of Home: House Home Access: Stairs to enter Entrance Stairs-Rails: Psychiatric nurse of Steps: several Home Layout: One level Home Equipment: None      Prior Function Level of Independence: Independent               Hand Dominance   Dominant  Hand: Right    Extremity/Trunk Assessment   Upper Extremity Assessment: Overall WFL for tasks assessed           Lower Extremity Assessment: Defer to PT evaluation   LLE Deficits / Details: Very mild fatiguability in musculature  Cervical / Trunk Assessment: Normal  Communication   Communication: No difficulties  Cognition Arousal/Alertness: Awake/alert Behavior During Therapy: WFL for tasks assessed/performed Overall Cognitive Status: Impaired/Different from baseline Area of Impairment: Memory     Memory: Decreased short-term memory              General Comments General comments (skin integrity, edema, etc.): scanning the environment caused pt to experience vertigo, mild instability and eventually nausea.  Pt did not lose balance, but could not handle the scanning for long.  pt given compensatory strategies.    Exercises        Assessment/Plan    PT Assessment Patient needs continued PT services  PT Diagnosis Difficulty walking   PT Problem List Decreased balance;Other (comment) (dysequilibrium/vertigo with scanning the environment)  PT Treatment Interventions Gait training;Therapeutic activities;Functional mobility training;Patient/family education;Balance training   PT Goals (Current goals can be found in the Care Plan section) Acute Rehab PT Goals Patient Stated Goal: to go home PT Goal Formulation: With patient Time For Goal Achievement: 02/24/14 Potential to Achieve Goals: Good    Frequency Min 1X/week   Barriers to discharge        Co-evaluation               End of Session   Activity Tolerance: Patient tolerated treatment well Patient left: in bed;with call bell/phone within reach Nurse Communication: Mobility status    Functional Assessment Tool Used: clinical judgement Functional Limitation: Mobility: Walking and moving around Mobility: Walking and Moving Around Current Status ([id number]): At least 1 percent but less than 20 percent  impaired, limited or restricted Mobility: Walking and Moving Around Goal Status [phone]): At least 1 percent but less than 20 percent impaired, limited or restricted    Time: 1146-1209 PT Time Calculation (min): 23 min   Charges:   PT Evaluation $Initial PT Evaluation Tier I: 1 Procedure PT Treatments $Gait Training: 8-22 mins   PT G Codes:   Functional Assessment Tool Used: clinical judgement Functional Limitation: Mobility: Walking and moving around    Masyn Fullam, Tessie Fass 02/20/2014, 4:08 PM 02/20/2014  Donnella Sham, [place] [phone]  (pager)

## 2014-02-20 NOTE — Evaluation (Signed)
Occupational Therapy Evaluation Patient Details Name: [name] MRN: [id number] DOB: 03-07-1966 Today's Date: 02/20/2014    History of Present Illness Pt presented to the ED with new onset left-sided facial numbness and blurry vision. Patient states around 12 PM today, he started to have left facial numbness in his cheek and mouth. He also noted blurry vision in his left eye and weakness of his left leg around the same time. He denies numbness in his tongue, but notes he has had a "waxy" taste for the last 3 days as well as decreased taste. Patient states he has difficulty concentrating. Patient feels that his speech is not slurred, but that he is speaking slower compared to normal. The symptoms are still occurring at this time. Patient also reports a 3 day history of headaches that are located in the right occipital/temporal region. He denies a history of migraines or headaches. He has tried Aleve and Retail banker with minimal relief. He denies visual changes when the headaches first started. He states the headaches are worse with light.   Clinical Impression   PT admitted with workup for CVA underway. Pt currently with functional limitiations due to the deficits listed below (see OT problem list). PTA independent. Pt will benefit from skilled OT to increase their independence and safety with adls and balance to allow discharge home with NO driving. Ot to follow acutely for adls with dynamic balance     Follow Up Recommendations  No OT follow up    Equipment Recommendations  None recommended by OT    Recommendations for Other Services       Precautions / Restrictions Precautions Precautions: Fall      Mobility Bed Mobility Overal bed mobility: Modified Independent                Transfers Overall transfer level: Modified independent                    Balance Overall balance assessment: Needs assistance                           High level  balance activites: Turns High Level Balance Comments: Pt with LOB min (A) with turning head LT. PT is fall risk. pt educated on fixating eyes on object then turning body            ADL Overall ADL's : Needs assistance/impaired Eating/Feeding: Independent   Grooming: Wash/dry hands;Supervision/safety               Lower Body Dressing: Supervision/safety;Sit to/from stand   Toilet Transfer: Supervision/safety;Ambulation;Regular Toilet           Functional mobility during ADLs: Minimal assistance (x1 LOB) General ADL Comments: Pt with LOB turning to the left. Pt reports light sensitive. Pt reports objects moving toward patient as challenging. Based on visual deficits this session RECOMMEND NO DRIVING. Pt is high risk for accident     Vision Eye Alignment: Within Functional Limits     Tracking/Visual Pursuits: Left eye does not track laterally;Other (comment) (decr smooth pursuits)         Additional Comments: PT occluded Rt eye and reports LT eye blurry. pt with LT eye occluded and reports Rt eye no blurred vision. pt with nystagmus with LT eye   Perception     Praxis      Pertinent Vitals/Pain Pain Assessment: 0-10 Pain Score:  (does not rate, but says "bad") Pain Location:  HA- reports posterior aspect of skull, light sensitive Pain Intervention(s): Repositioned     Hand Dominance Right   Extremity/Trunk Assessment Upper Extremity Assessment Upper Extremity Assessment: Overall WFL for tasks assessed   Lower Extremity Assessment Lower Extremity Assessment: Defer to PT evaluation LLE Deficits / Details: Very mild fatiguability in musculature   Cervical / Trunk Assessment Cervical / Trunk Assessment: Normal   Communication Communication Communication: No difficulties   Cognition Arousal/Alertness: Awake/alert Behavior During Therapy: WFL for tasks assessed/performed Overall Cognitive Status: Impaired/Different from baseline Area of Impairment:  Memory     Memory: Decreased short-term memory             General Comments       Exercises       Shoulder Instructions      Home Living Family/patient expects to be discharged to:: Private residence Living Arrangements: Other (Comment) Available Help at Discharge: Available PRN/intermittently Type of Home: House Home Access: Stairs to enter [employer] of Steps: several Entrance Stairs-Rails: Right;Left Home Layout: One level         Biochemist, clinical: Standard     Home Equipment: None      Lives With: Friend(s);Other (Comment) (pt is caregiver (meds, cooking/cleaning, finances))    Prior Functioning/Environment Level of Independence: Independent             OT Diagnosis: Generalized weakness;Acute pain   OT Problem List: Decreased strength;Decreased activity tolerance;Impaired balance (sitting and/or standing);Decreased knowledge of use of DME or AE;Decreased knowledge of precautions;Pain   OT Treatment/Interventions: Self-care/ADL training;Therapeutic exercise;DME and/or AE instruction;Therapeutic activities;Patient/family education;Balance training    OT Goals(Current goals can be found in the care plan section) Acute Rehab OT Goals Patient Stated Goal: to go home OT Goal Formulation: With patient Time For Goal Achievement: 03/06/14 Potential to Achieve Goals: Good  OT Frequency: Min 2X/week   Barriers to D/C:            Co-evaluation              End of Session Equipment Utilized During Treatment: Gait belt Nurse Communication: Mobility status;Precautions  Activity Tolerance: Patient tolerated treatment well Patient left: in bed;with call bell/phone within reach   Time: 1455-1530 OT Time Calculation (min): 35 min Charges:  OT General Charges $OT Visit: 1 Procedure OT Evaluation $Initial OT Evaluation Tier I: 1 Procedure OT Treatments $Self Care/Home Management : 8-22 mins $Therapeutic Activity: 8-22 mins G-Codes:     Parke Poisson B 02-21-14, 3:40 PM Pager: [phone]

## 2014-02-20 NOTE — Evaluation (Signed)
Speech Language Pathology Evaluation Patient Details Name: [name] MRN: [id number] DOB: 04/06/1966 Today's Date: 02/20/2014 Time: 1751-0258 SLP Time Calculation (min): 29 min  Problem List:  Patient Active Problem List   Diagnosis Date Noted  . TIA (transient ischemic attack) 02/19/2014  . Rib pain on left side 07/09/2012   Past Medical History:  Past Medical History  Diagnosis Date  . Allergy   . Ulcer   . Left acetabular fracture     nonoperative around age 63   Past Surgical History:  Past Surgical History  Procedure Laterality Date  . Cosmetic surgery    . Knee arthroscopy w/ meniscal repair      Both knees   HPI:  [name] is an 48 y.o. male with a history of peptic ulcer disease, and acetabular Fx, presenting with new onset left side numbness and weakness and blurred vision. He has been experiencing a headache for 3 days, but has no Hx of migraines. CT scan of the head was unremarkable. MRI did not reveal an acute infarct on limited exam.   Assessment / Plan / Recommendation Clinical Impression  Pt demonstrates mild-moderate impairments with retrieval of new information across 10 minute intervals as well as difficulty performing higher level linguistic tasks, suspect due in part to decreased thought organization and mildly slowed processing. 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 Pt would benefit from acute SLP services to faciltiate transition home with f/u [facility] SLP in order to maximize functional independence.    SLP Assessment  Patient needs continued Speech Lanaguage Pathology Services    Follow Up Recommendations  Home health SLP;Other (comment) (intermittent supervision)    Frequency and Duration min 2x/week  1 week   Pertinent Vitals/Pain Pain Assessment: 0-10 Pain Score:  (does not rate, but says "bad") Pain Location: headache Pain  Intervention(s): Monitored during session;Other (comment) (RN present and aware)   SLP Goals  Patient/Family Stated Goal: wants to know what the cause of his symptoms is Potential to Achieve Goals: Good  SLP Evaluation Prior Functioning  Cognitive/Linguistic Baseline: Within functional limits Type of Home: House  Lives With: Friend(s);Other (Comment) (pt is caregiver (meds, cooking/cleaning, finances)) Available Help at Discharge: Available PRN/intermittently Vocation: Full time employment ([employer])   Cognition  Overall Cognitive Status: Impaired/Different from baseline Arousal/Alertness: Awake/alert Orientation Level: Oriented X4 Attention: Alternating Alternating Attention: Appears intact Memory: Impaired Memory Impairment: Decreased recall of new information;Retrieval deficit Awareness: Appears intact Problem Solving: Appears intact (for basic problem solving) Safety/Judgment: Appears intact    Comprehension  Auditory Comprehension Overall Auditory Comprehension: Appears within functional limits for tasks assessed Visual Recognition/Discrimination Discrimination: Within Function Limits Reading Comprehension Reading Status: Within funtional limits (appears to be impacted by vision (OT aware))    Expression Expression Primary Mode of Expression: Verbal Verbal Expression Overall Verbal Expression: Impaired Initiation: No impairment Automatic Speech: Name;Social Response Level of Generative/Spontaneous Verbalization: Conversation Naming: Impairment Responsive:  (WFL) Confrontation: Within functional limits Divergent: 50-74% accurate Verbal Errors: Other (comment) (mild anomia in conversation) Pragmatics: No impairment Non-Verbal Means of Communication: Not applicable Written Expression Written Expression: Not tested   Oral / Motor Motor Speech Overall Motor Speech: Appears within functional limits for tasks assessed   GO Functional Assessment Tool Used: skilled  clinical judgment Functional Limitations: Memory Memory Current Status (N2778): At least 40 percent but less than 60 percent impaired, limited or restricted Memory Goal Status (E4235): At least  20 percent but less than 40 percent impaired, limited or restricted     [name], M.A. CCC-SLP [phone]  [name] 02/20/2014, 3:15 PM

## 2014-02-20 NOTE — Progress Notes (Signed)
Subjective: Patient states he is still having facial numbness on the left side, but it is no longer present in his mouth/lip. He notes his left-sided blurry vision has improved as well. He denies weakness in his extremities and the "waxy"/decreased taste sensation has resolved. He reports his headache went away last night and then returned again this AM and has been persistent.  Objective: Vital signs in last 24 hours: Filed Vitals:   02/20/14 0803 02/20/14 0934 02/20/14 0954 02/20/14 1428  BP: 115/72 118/66 105/69 125/73  Pulse: 86 96 98 102  Temp: 98.6 F (37 C) 98.8 F (37.1 C) 97.7 F (36.5 C) 97.9 F (36.6 C)  TempSrc: Oral Oral Oral Oral  Resp: 18 18 18 18   Height:      Weight:      SpO2: 95% 96% 96% 94%   Weight change:   Intake/Output Summary (Last 24 hours) at 02/20/14 1604 Last data filed at 02/20/14 1252  Gross per 24 hour  Intake    760 ml  Output   1350 ml  Net   -590 ml   Physical Exam General: laying in bed, alert, NAD  HEENT: [place]/AT, EOMI, PERRL, sclera anicteric, pharynx non-erythematous, mucus membranes moist  Neck: supple, no JVD, no lymphadenopathy  CV: RRR, normal S1/S2, no m/g/r  Pulm: CTA bilaterally, breaths non-labored, no wheezing  Abd: BS+, soft, non-distended, non-tender  Ext: warm, no edema, moves all  Neuro: alert and oriented x 3, speech fluent without aphasia, follows commands appropriately. Mild facial numbness. Smiling improved this AM. Strength 5/5 in upper and lower extremities bilaterally  Lab Results: Basic Metabolic Panel:  Recent Labs Lab 02/19/14 1449 02/19/14 1454  NA 140 141  K 3.9 3.7  CL 103 104  CO2 26  --   GLUCOSE 112* 115*  BUN 14 16  CREATININE 0.94 0.90  CALCIUM 9.1  --    Liver Function Tests:  Recent Labs Lab 02/19/14 1449  AST 25  ALT 28  ALKPHOS 49  BILITOT 0.3  PROT 7.2  ALBUMIN 3.7   No results found for this basename: LIPASE, AMYLASE,  in the last 168 hours No results found for this  basename: AMMONIA,  in the last 168 hours CBC:  Recent Labs Lab 02/19/14 1449 02/19/14 1454  WBC 6.7  --   NEUTROABS 4.1  --   HGB 14.6 16.0  HCT 45.0 47.0  MCV 87.5  --   PLT 192  --    Cardiac Enzymes: No results found for this basename: CKTOTAL, CKMB, CKMBINDEX, TROPONINI,  in the last 168 hours BNP: No results found for this basename: PROBNP,  in the last 168 hours D-Dimer: No results found for this basename: DDIMER,  in the last 168 hours CBG:  Recent Labs Lab 02/19/14 1506  GLUCAP 93   Hemoglobin A1C:  Recent Labs Lab 02/20/14 0610  HGBA1C 6.3*   Fasting Lipid Panel:  Recent Labs Lab 02/20/14 0610  CHOL 192  HDL 48  LDLCALC 134*  TRIG 48  CHOLHDL 4.0   Thyroid Function Tests: No results found for this basename: TSH, T4TOTAL, FREET4, T3FREE, THYROIDAB,  in the last 168 hours Coagulation:  Recent Labs Lab 02/19/14 1449  LABPROT 13.9  INR 1.07   Anemia Panel: No results found for this basename: VITAMINB12, FOLATE, FERRITIN, TIBC, IRON, RETICCTPCT,  in the last 168 hours Urine Drug Screen: Drugs of Abuse     Component Value Date/Time   LABOPIA NONE DETECTED 02/19/2014 1604  COCAINSCRNUR NONE DETECTED 02/19/2014 1604   LABBENZ NONE DETECTED 02/19/2014 1604   AMPHETMU NONE DETECTED 02/19/2014 1604   THCU NONE DETECTED 02/19/2014 1604   LABBARB NONE DETECTED 02/19/2014 1604    Alcohol Level:  Recent Labs Lab 02/19/14 1449  ETH <11   Urinalysis:  Recent Labs Lab 02/19/14 1604  COLORURINE YELLOW  LABSPEC 1.024  PHURINE 6.0  GLUCOSEU NEGATIVE  HGBUR NEGATIVE  BILIRUBINUR NEGATIVE  KETONESUR NEGATIVE  PROTEINUR NEGATIVE  UROBILINOGEN 0.2  NITRITE NEGATIVE  LEUKOCYTESUR NEGATIVE    Micro Results: No results found for this or any previous visit (from the past 240 hour(s)). Studies/Results: Dg Chest 2 View  02/20/2014   CLINICAL DATA:  CVA.  EXAM: CHEST  2 VIEW  COMPARISON:  Chest radiograph performed 12/22/2013  FINDINGS: The lungs  are hypoexpanded. No pleural effusion or pneumothorax is seen. There is no evidence of focal opacification, pleural effusion or pneumothorax.  The heart is borderline normal in size. No acute osseous abnormalities are seen.  IMPRESSION: Lungs hypoexpanded but grossly clear.   Electronically Signed   By: Garald Balding M.D.   On: 02/20/2014 02:22   Ct Head Wo Contrast  02/19/2014   CLINICAL DATA:  48 year old male with headache, left blurred vision and left facial weakness. Code stroke.  EXAM: CT HEAD WITHOUT CONTRAST  TECHNIQUE: Contiguous axial images were obtained from the base of the skull through the vertex without intravenous contrast.  COMPARISON:  None.  FINDINGS: No intracranial abnormalities are identified, including mass lesion or mass effect, hydrocephalus, extra-axial fluid collection, midline shift, hemorrhage, or acute infarction.  The visualized bony calvarium is unremarkable.  IMPRESSION: Unremarkable noncontrast head CT.  Critical Value/emergent results were called by telephone at the time of interpretation on 02/19/2014 at 3:08 pm to Dr. Nicole Kindred, who verbally acknowledged these results.   Electronically Signed   By: Hassan Rowan M.D.   On: 02/19/2014 15:09   Mr Brain Wo Contrast  02/19/2014   CLINICAL DATA:  48 year old male presenting with Acute onset of left facial numbness and blurred vision. Initial encounter.  EXAM: MRI HEAD WITHOUT CONTRAST  MRA HEAD WITHOUT CONTRAST  TECHNIQUE: Multiplanar, multiecho pulse sequences of the brain and surrounding structures were obtained without intravenous contrast. Angiographic images of the head were obtained using MRA technique without contrast.  COMPARISON:  02/19/2014.  FINDINGS: MRI HEAD FINDINGS  Exam is limited to diffusion sequence and sagittal T1 sequence. Patient requested exam be terminated at this point.  No acute infarct.  No obvious intracranial hemorrhage or intracranial mass noted on this limited exam.  Cervical medullary junction, pituitary  region and pineal region unremarkable. No hydrocephalus.  MRA HEAD FINDINGS  Anterior circulation without medium or large size vessel significant stenosis or occlusion.  Diminutive size right vertebral artery which appears to end in a posterior inferior cerebellar artery distribution. Narrowing and irregularity of the right vertebral artery and right posterior inferior cerebellar artery.  Ectatic left vertebral artery without significant narrowing.  No significant narrowing of the basilar artery.  Non visualized right anterior inferior cerebellar artery.  Mild narrowing superior cerebellar artery bilaterally.  Mild irregularity posterior cerebral artery distal branches.  No aneurysm detected.  IMPRESSION: MRI HEAD :  Exam is limited to diffusion sequence and sagittal T1 sequence. Patient requested exam be terminated at this point.  No acute infarct.  No obvious intracranial hemorrhage or intracranial mass noted on this limited exam.  MRA HEAD:  Anterior circulation without medium or large size vessel significant  stenosis or occlusion.  Diminutive size right vertebral artery which appears to end in a posterior inferior cerebellar artery distribution. Narrowing and irregularity of the right vertebral artery and right posterior inferior cerebellar artery.  Please see above.   Electronically Signed   By: Chauncey Cruel M.D.   On: 02/19/2014 19:52   Mr Jodene Nam Head/brain Wo Cm  02/19/2014   CLINICAL DATA:  48 year old male presenting with Acute onset of left facial numbness and blurred vision. Initial encounter.  EXAM: MRI HEAD WITHOUT CONTRAST  MRA HEAD WITHOUT CONTRAST  TECHNIQUE: Multiplanar, multiecho pulse sequences of the brain and surrounding structures were obtained without intravenous contrast. Angiographic images of the head were obtained using MRA technique without contrast.  COMPARISON:  02/19/2014.  FINDINGS: MRI HEAD FINDINGS  Exam is limited to diffusion sequence and sagittal T1 sequence. Patient requested  exam be terminated at this point.  No acute infarct.  No obvious intracranial hemorrhage or intracranial mass noted on this limited exam.  Cervical medullary junction, pituitary region and pineal region unremarkable. No hydrocephalus.  MRA HEAD FINDINGS  Anterior circulation without medium or large size vessel significant stenosis or occlusion.  Diminutive size right vertebral artery which appears to end in a posterior inferior cerebellar artery distribution. Narrowing and irregularity of the right vertebral artery and right posterior inferior cerebellar artery.  Ectatic left vertebral artery without significant narrowing.  No significant narrowing of the basilar artery.  Non visualized right anterior inferior cerebellar artery.  Mild narrowing superior cerebellar artery bilaterally.  Mild irregularity posterior cerebral artery distal branches.  No aneurysm detected.  IMPRESSION: MRI HEAD :  Exam is limited to diffusion sequence and sagittal T1 sequence. Patient requested exam be terminated at this point.  No acute infarct.  No obvious intracranial hemorrhage or intracranial mass noted on this limited exam.  MRA HEAD:  Anterior circulation without medium or large size vessel significant stenosis or occlusion.  Diminutive size right vertebral artery which appears to end in a posterior inferior cerebellar artery distribution. Narrowing and irregularity of the right vertebral artery and right posterior inferior cerebellar artery.  Please see above.   Electronically Signed   By: Chauncey Cruel M.D.   On: 02/19/2014 19:52   Medications: I have reviewed the patient's current medications. Scheduled Meds: . aspirin  300 mg Rectal Daily   Or  . aspirin  325 mg Oral Daily  . heparin  5,000 Units Subcutaneous 3 times per day   Continuous Infusions:  PRN Meds:.acetaminophen, senna-docusate Assessment/Plan: Mr. Carn is a 48yo man w/ PMHx of peptic ulcer disease and history of left acetabular fracture in 2005 who  presented to the ED with new onset left-sided facial numbness and blurry vision.   1. Possible TIA: Patient presented with new onset left facial numbness, left blurry vision, and left leg weakness. Pt also with 3 day history of right sided headaches in occipital/temporal region. CT head negative. Vital signs stable on admission. MRI/MRA neg for acute stroke but MRA showed irregular right vertebral artery flow. Lipid panel shows elevated LDL 134. HbA1c 6.3.  - Neurology following, appreciate recommendations  - Echocardiogram done, pending read - Carotid dopplers pending - PT/OT/SLP consulted---> SLP recommends home SLP, OT recommends no f/u - Aspirin 325 mg daily  - Cardiac monitoring  - Neuro checks Q2H   Diet: Regular DVT/PE PPx: Heparin SQ  Dispo: Disposition is deferred at this time, awaiting improvement of current medical problems. Anticipated discharge tomorrow.   The patient does have a  current PCP (No Pcp Per Patient) and does need an [facility] hospital follow-up appointment after discharge.  The patient does not have transportation limitations that hinder transportation to clinic appointments.  .Services Needed at time of discharge: Y = Yes, Blank = No PT:   OT:   RN:   Equipment:   Other:     LOS: 1 day   Albin Felling, MD 02/20/2014, 4:04 PM

## 2014-02-20 NOTE — Progress Notes (Signed)
  Echocardiogram 2D Echocardiogram has been performed.  Darlina Sicilian M 02/20/2014, 9:17 AM

## 2014-02-20 NOTE — Progress Notes (Signed)
Utilization Review Completed.[name] T9/18/2015  

## 2014-02-21 ENCOUNTER — Observation Stay (HOSPITAL_COMMUNITY): Payer: Commercial Managed Care - PPO

## 2014-02-21 ENCOUNTER — Encounter (HOSPITAL_COMMUNITY): Payer: Self-pay

## 2014-02-21 DIAGNOSIS — E785 Hyperlipidemia, unspecified: Secondary | ICD-10-CM

## 2014-02-21 DIAGNOSIS — G459 Transient cerebral ischemic attack, unspecified: Secondary | ICD-10-CM | POA: Diagnosis not present

## 2014-02-21 DIAGNOSIS — I6529 Occlusion and stenosis of unspecified carotid artery: Secondary | ICD-10-CM | POA: Diagnosis not present

## 2014-02-21 DIAGNOSIS — R079 Chest pain, unspecified: Secondary | ICD-10-CM | POA: Diagnosis not present

## 2014-02-21 DIAGNOSIS — I639 Cerebral infarction, unspecified: Secondary | ICD-10-CM

## 2014-02-21 DIAGNOSIS — R739 Hyperglycemia, unspecified: Secondary | ICD-10-CM | POA: Diagnosis present

## 2014-02-21 HISTORY — DX: Hyperlipidemia, unspecified: E78.5

## 2014-02-21 HISTORY — DX: Cerebral infarction, unspecified: I63.9

## 2014-02-21 LAB — TROPONIN I: Troponin I: <0.3 ng/mL (ref <0.30)

## 2014-02-21 MED ORDER — ONDANSETRON HCL 4 MG/2ML IJ SOLN
4.0000 mg | Freq: Once | INTRAMUSCULAR | Status: AC
  Administered 2014-02-21: 4 mg via INTRAVENOUS
  Filled 2014-02-21: qty 2

## 2014-02-21 MED ORDER — ATORVASTATIN CALCIUM 40 MG PO TABS: 40.0000 mg | ORAL_TABLET | Freq: Every day | ORAL | Status: DC

## 2014-02-21 MED ORDER — ASPIRIN EC 81 MG PO TBEC: 81.0000 mg | DELAYED_RELEASE_TABLET | Freq: Every day | ORAL | Status: DC

## 2014-02-21 NOTE — Significant Event (Signed)
Rapid Response Event Note  Overview: Time Called: 0086 Arrival Time: 0254 Event Type: Cardiac Called to room per floor RN for pt complaining of severe chest pain 7/10 beginning around 0220. EKG obtained, yielding NSR. Pt sat up in bed with assist and admitted to feeling better. Prior to my arrival per floor RN pt admits to relief of left upper chest pain after repositioning 4/10.   Initial Focused Assessment: Upon my arrival pt found resting in bed eyes closed. Awakens easy to voice. Alert oriented X 4, VSS denies sob, dizziness. Complains of resolving chest pain 2/10 and improving.   Interventions: Floor RN advised to notify pt primary MD while I was en route to room. Teaching Service MD Dr. Roger Shelter paged and updated per floor RN. Troponin ordered. Pt already on tele. RN to monitor closely and notify myself and provider of worsening condition.   Event Summary: Name of Physician Notified: Teaching Service-E.Moding MD at 0250    at    Outcome: Stayed in room and stabalized     Gill, Scott

## 2014-02-21 NOTE — Discharge Summary (Signed)
Name: [name] MRN: [id number] DOB: [birth year] 48 y.o. PCP: No Pcp Per Patient  Date of Admission: 02/19/2014  2:48 PM Date of Discharge: 02/21/2014 Attending Physician: Bartholomew Crews, MD  Discharge Diagnosis:   TIA (transient ischemic attack)   HLD (hyperlipidemia)   Chest Pressure   Hyperglycemia  Discharge Medications:   Medication List    STOP taking these medications       ALEVE 220 MG tablet  Generic drug:  naproxen sodium     BAYER BACK & BODY PAIN EX ST 500-32.5 MG Tabs  Generic drug:  Aspirin-Caffeine     oxymetazoline 0.05 % nasal spray  Commonly known as:  AFRIN      TAKE these medications       aspirin EC 81 MG tablet  Take 1 tablet (81 mg total) by mouth daily.     atorvastatin 40 MG tablet  Commonly known as:  LIPITOR  Take 1 tablet (40 mg total) by mouth daily at 6 PM.        Disposition and follow-up:   Mr.[name] was discharged from [facility] in Stable condition.  At the hospital follow up visit please address:  1.  If he has had any other episodes of numbness of the face or trouble chewing  2.  Labs / imaging needed at time of follow-up: Monitor blood glucose levels, A1c 6.3  3.  Pending labs/ test needing follow-up: None  Follow-up Appointments: Follow-up Information   Follow up with [facility], MD. Schedule an appointment as soon as possible for a visit in 3 days.   Specialty:  Family Medicine   Contact information:   [address] [place] [address] [phone]       Discharge Instructions: Discharge Instructions   Activity as tolerated - No restrictions    Complete by:  As directed      Call MD for:  extreme fatigue    Complete by:  As directed      Call MD for:  persistant dizziness or light-headedness    Complete by:  As directed      Call MD for:  persistant nausea and vomiting    Complete by:  As directed      Diet - low sodium heart healthy    Complete by:  As  directed      Discharge instructions    Complete by:  As directed   Continue to take the aspirin 81mg  daily and the atorvastatin daily.  Try to get some good rest over the next few days and return to work on Monday.           Consultations:  Neurology   Procedures Performed:  Dg Chest 2 View  02/20/2014   CLINICAL DATA:  CVA.  EXAM: CHEST  2 VIEW  COMPARISON:  Chest radiograph performed 12/22/2013  FINDINGS: The lungs are hypoexpanded. No pleural effusion or pneumothorax is seen. There is no evidence of focal opacification, pleural effusion or pneumothorax.  The heart is borderline normal in size. No acute osseous abnormalities are seen.  IMPRESSION: Lungs hypoexpanded but grossly clear.   Electronically Signed   By: Garald Balding M.D.   On: 02/20/2014 02:22   Ct Head Wo Contrast  02/21/2014   CLINICAL DATA:  Left facial numbness.  Concern for TIA.  EXAM: CT HEAD WITHOUT CONTRAST  TECHNIQUE: Contiguous axial images were obtained from the base of the skull through the vertex without intravenous contrast.  COMPARISON:  Brain CT 02/19/2014  FINDINGS: There is no intra or extra-axial fluid collection or mass lesion. The basilar cisterns and ventricles have a normal appearance. There is no CT evidence for acute infarction or hemorrhage. Paranasal sinuses are unremarkable. Mastoid air cells are well aerated. Calvarium is intact.  IMPRESSION: No acute intracranial process.   Electronically Signed   By: Lovey Newcomer M.D.   On: 02/21/2014 14:13   Ct Head Wo Contrast  02/19/2014   CLINICAL DATA:  48 year old male with headache, left blurred vision and left facial weakness. Code stroke.  EXAM: CT HEAD WITHOUT CONTRAST  TECHNIQUE: Contiguous axial images were obtained from the base of the skull through the vertex without intravenous contrast.  COMPARISON:  None.  FINDINGS: No intracranial abnormalities are identified, including mass lesion or mass effect, hydrocephalus, extra-axial fluid collection, midline  shift, hemorrhage, or acute infarction.  The visualized bony calvarium is unremarkable.  IMPRESSION: Unremarkable noncontrast head CT.  Critical Value/emergent results were called by telephone at the time of interpretation on 02/19/2014 at 3:08 pm to Dr. Nicole Kindred, who verbally acknowledged these results.   Electronically Signed   By: Hassan Rowan M.D.   On: 02/19/2014 15:09   Mr Brain Wo Contrast  02/19/2014   CLINICAL DATA:  48 year old male presenting with Acute onset of left facial numbness and blurred vision. Initial encounter.  EXAM: MRI HEAD WITHOUT CONTRAST  MRA HEAD WITHOUT CONTRAST  TECHNIQUE: Multiplanar, multiecho pulse sequences of the brain and surrounding structures were obtained without intravenous contrast. Angiographic images of the head were obtained using MRA technique without contrast.  COMPARISON:  02/19/2014.  FINDINGS: MRI HEAD FINDINGS  Exam is limited to diffusion sequence and sagittal T1 sequence. Patient requested exam be terminated at this point.  No acute infarct.  No obvious intracranial hemorrhage or intracranial mass noted on this limited exam.  Cervical medullary junction, pituitary region and pineal region unremarkable. No hydrocephalus.  MRA HEAD FINDINGS  Anterior circulation without medium or large size vessel significant stenosis or occlusion.  Diminutive size right vertebral artery which appears to end in a posterior inferior cerebellar artery distribution. Narrowing and irregularity of the right vertebral artery and right posterior inferior cerebellar artery.  Ectatic left vertebral artery without significant narrowing.  No significant narrowing of the basilar artery.  Non visualized right anterior inferior cerebellar artery.  Mild narrowing superior cerebellar artery bilaterally.  Mild irregularity posterior cerebral artery distal branches.  No aneurysm detected.  IMPRESSION: MRI HEAD :  Exam is limited to diffusion sequence and sagittal T1 sequence. Patient requested exam be  terminated at this point.  No acute infarct.  No obvious intracranial hemorrhage or intracranial mass noted on this limited exam.  MRA HEAD:  Anterior circulation without medium or large size vessel significant stenosis or occlusion.  Diminutive size right vertebral artery which appears to end in a posterior inferior cerebellar artery distribution. Narrowing and irregularity of the right vertebral artery and right posterior inferior cerebellar artery.  Please see above.   Electronically Signed   By: Chauncey Cruel M.D.   On: 02/19/2014 19:52   Mr Jodene Nam Head/brain Wo Cm  02/19/2014   CLINICAL DATA:  48 year old male presenting with Acute onset of left facial numbness and blurred vision. Initial encounter.  EXAM: MRI HEAD WITHOUT CONTRAST  MRA HEAD WITHOUT CONTRAST  TECHNIQUE: Multiplanar, multiecho pulse sequences of the brain and surrounding structures were obtained without intravenous contrast. Angiographic images of the head were obtained using MRA technique without contrast.  COMPARISON:  02/19/2014.  FINDINGS: MRI HEAD FINDINGS  Exam is limited to diffusion sequence and sagittal T1 sequence. Patient requested exam be terminated at this point.  No acute infarct.  No obvious intracranial hemorrhage or intracranial mass noted on this limited exam.  Cervical medullary junction, pituitary region and pineal region unremarkable. No hydrocephalus.  MRA HEAD FINDINGS  Anterior circulation without medium or large size vessel significant stenosis or occlusion.  Diminutive size right vertebral artery which appears to end in a posterior inferior cerebellar artery distribution. Narrowing and irregularity of the right vertebral artery and right posterior inferior cerebellar artery.  Ectatic left vertebral artery without significant narrowing.  No significant narrowing of the basilar artery.  Non visualized right anterior inferior cerebellar artery.  Mild narrowing superior cerebellar artery bilaterally.  Mild irregularity  posterior cerebral artery distal branches.  No aneurysm detected.  IMPRESSION: MRI HEAD :  Exam is limited to diffusion sequence and sagittal T1 sequence. Patient requested exam be terminated at this point.  No acute infarct.  No obvious intracranial hemorrhage or intracranial mass noted on this limited exam.  MRA HEAD:  Anterior circulation without medium or large size vessel significant stenosis or occlusion.  Diminutive size right vertebral artery which appears to end in a posterior inferior cerebellar artery distribution. Narrowing and irregularity of the right vertebral artery and right posterior inferior cerebellar artery.  Please see above.   Electronically Signed   By: Chauncey Cruel M.D.   On: 02/19/2014 19:52    2D Echo:  02/20/14 Impressions: - Normal biventricular size and function. Abnormal relaxation (grade 1 diastolic dysfunction). Normal filling pressures.   Admission  History of Present Illness: Mr. Lewinski is a 48yo man w/ PMHx of peptic ulcer disease and history of left acetabular fracture in 2005 who presented to the ED with new onset left-sided facial numbness and blurry vision. Patient states around 12 PM today, he started to have left facial numbness in his cheek and mouth. He also noted blurry vision in his left eye and weakness of his left leg around the same time. He denies numbness in his tongue, but notes he has had a "waxy" taste for the last 3 days as well as decreased taste. Patient states he has difficulty concentrating. Patient feels that his speech is not slurred, but that he is speaking slower compared to normal. The symptoms are still occurring at this time. Patient also reports a 3 day history of headaches that are located in the right occipital/temporal region. He denies a history of migraines or headaches. He has tried Aleve and [employer] with minimal relief. He denies visual changes when the headaches first started. He states the headaches are worse with light.    In the ED, patient was code stroke. CT head was negative for acute stroke. Review of Systems:  General: Denies fever, chills, night sweats, changes in weight, changes in appetite  HEENT: Denies ear pain, rhinorrhea, sore throat  CV: Denies CP, palpitations, SOB, orthopnea  Pulm: Denies SOB, cough, wheezing  GI: Denies abdominal pain, nausea, vomiting, diarrhea, constipation, melena, hematochezia  GU: Denies dysuria, hematuria, frequency  Msk: Denies muscle cramps, joint pains  Neuro: See HPI  Skin: Denies rashes, bruising  Physical Exam:  Blood pressure 132/77, pulse 96, temperature 98.1 F (36.7 C), temperature source Oral, resp. rate 21, height 6\' 2"  (1.88 m), weight 341 lb (154.677 kg), SpO2 96.00%.  General: sitting up in chair, alert, NAD  HEENT: [place]/AT, EOMI, PERRL, sclera  anicteric, pharynx non-erythematous, mucus membranes moist  Neck: supple, no JVD, no lymphadenopathy  CV: mildly tachycardic, normal S1/S2, no m/g/r  Pulm: CTA bilaterally, breaths non-labored, no wheezing  Abd: BS+, soft, non-distended, non-tender  Ext: warm, no edema, moves all  Neuro: alert and oriented x 3, speech fluent without aphasia, follows commands appropriately. Mild facial numbness. Difficulty with smiling. Strength 5/5 in upper and lower extremities bilaterally   Hospital Course by problem list: Mr. Seddon is a 48yo man w/ PMHx of peptic ulcer disease and history of left acetabular fracture in 2005 who presented to the ED with new onset left-sided facial numbness and blurry vision.   Possible TIA: Patient presented with new onset left facial numbness, left blurry vision, and left leg weakness. Pt also with 3 day history of right sided headaches in occipital/temporal region. CT head negative. Vital signs stable on admission. MRI/MRA neg for acute stroke but MRA showed irregular right vertebral artery flow. Lipid panel shows elevated LDL 134. HbA1c 6.3. ECHO w/o embolic source. Carotid dopplers w/ 1-39%  ICA stenosis; antegrade vertebral artery flow. Neurology signed off on HD#2. On HD#3, pt with with sudden difficulty chewing and food loss from both sides of the mouth with drooling bilaterally. No trouble swallowing. Neurology was called and Dr. Nicole Kindred felt that the patient's symptoms were very inconsistent and were likely not from TIA/CVA. He recommended checking a CT head w/o contrast and if it was negative, no further w/u was needed, as the symptoms were likely not related to neurological deficits. CT head was unremarkable and w/o acute findings. The patient's symptoms had improved prior to discharge, and seemed to be stress related. He was deemed stable for discharge home.  - Recommended continuing ASA 81mg  po daily for primary prevention - Recommended Lipitor 40mg  po daily - Pt asked to return to the ED or Urgent Care if his symptoms returned or worsened.   Chest pain: 1 episode of left sided chest pressure x1 hour overnight on 9/18. EKG normal. Troponin negative x2 11 hours apart. No further episodes of pain/pressure prior to discharge.  - Continue ASA 81mg  po daily  Discharge Vitals:   BP 124/63  Pulse 91  Temp(Src) 98.4 F (36.9 C) (Oral)  Resp 18  Ht 6\' 2"  (1.88 m)  Wt 338 lb (153.316 kg)  BMI 43.38 kg/m2  SpO2 96%  Discharge Labs:  Results for orders placed during the hospital encounter of 02/19/14 (from the past 24 hour(s))  TROPONIN I     Status: None   Collection Time    02/21/14  4:14 AM      Result Value Ref Range   Troponin I <0.30  <0.30 ng/mL  TROPONIN I     Status: None   Collection Time    02/21/14  3:09 PM      Result Value Ref Range   Troponin I <0.30  <0.30 ng/mL    Signed: Otho Bellows, MD 02/22/2014, 3:11 PM    Services Ordered on Discharge: None Equipment Ordered on Discharge: None

## 2014-02-21 NOTE — Progress Notes (Signed)
Pt seen and examined with Dr.Glenn today. Called to bedside to assess pt. Pt with drooling on attempting to eat but with no lateralizing defect.   TIA: Neuro f/u noted - unlikely TIA/CVA.  CT head ordered but if negative no further w/u per neuro. F/u carotid dopplers C/w asa D/c home if above w/u negative

## 2014-02-21 NOTE — Progress Notes (Signed)
Pt. [name] home via car with family.  DC instructions and prescriptions given to patient and understood.  Vital signs and assessments were stable.

## 2014-02-21 NOTE — Progress Notes (Signed)
Subjective: Left facial numbness and the "waxy" sensation in the mouth have resolved. Headache has resolved. Still with mild blurry vision from the left eye this morning. Nursing called around noon stating that the patient was unable to chew but was able to swallow. However, he did have emesis with lunch x1. In speaking to the patient immediately after this episode, he was stating that he has been drooling since last night and feels that he is unable to chew b/c the food is falling out of both sides of his mouth.  Objective: Vital signs in last 24 hours: Filed Vitals:   02/21/14 0313 02/21/14 0536 02/21/14 0956 02/21/14 1239  BP: 125/70 128/72 128/73 124/63  Pulse: 73 70 72 91  Temp: 97.9 F (36.6 C) 97.5 F (36.4 C) 97.5 F (36.4 C) 98.4 F (36.9 C)  TempSrc: Oral Oral Oral Oral  Resp: 18 18 18 18   Height:      Weight:      SpO2: 98% 97% 96% 96%   Weight change:   Intake/Output Summary (Last 24 hours) at 02/21/14 1355 Last data filed at 02/20/14 1740  Gross per 24 hour  Intake    300 ml  Output      0 ml  Net    300 ml   Physical Exam General: Lying in bed, alert, NAD  HEENT: [place]/AT, EOMI, Neck: supple, no JVD, no lymphadenopathy  CV: RRR, normal, no m/g/r  Pulm: CTAB, breaths non-labored, no wheezing  Abd: BS+, soft, non-distended, non-tender  Ext: Warm, no edema, moves all  Neuro: Alert and oriented x 3, speech fluent without aphasia but is a bit slower around noon. Follows commands. Smile somewhat asymmetric with left mild droop, but possibly has flattening of nasolabial fold on the right.  Lab Results: Basic Metabolic Panel:  Recent Labs Lab 02/19/14 1449 02/19/14 1454  NA 140 141  K 3.9 3.7  CL 103 104  CO2 26  --   GLUCOSE 112* 115*  BUN 14 16  CREATININE 0.94 0.90  CALCIUM 9.1  --    Liver Function Tests:  Recent Labs Lab 02/19/14 1449  AST 25  ALT 28  ALKPHOS 49  BILITOT 0.3  PROT 7.2  ALBUMIN 3.7   CBC:  Recent Labs Lab 02/19/14 1449  02/19/14 1454  WBC 6.7  --   NEUTROABS 4.1  --   HGB 14.6 16.0  HCT 45.0 47.0  MCV 87.5  --   PLT 192  --    Cardiac Enzymes:  Recent Labs Lab 02/21/14 0414  TROPONINI <0.30   CBG:  Recent Labs Lab 02/19/14 1506  GLUCAP 93   Hemoglobin A1C:  Recent Labs Lab 02/20/14 0610  HGBA1C 6.3*   Fasting Lipid Panel:  Recent Labs Lab 02/20/14 0610  CHOL 192  HDL 48  LDLCALC 134*  TRIG 48  CHOLHDL 4.0   Coagulation:  Recent Labs Lab 02/19/14 1449  LABPROT 13.9  INR 1.07   Urine Drug Screen: Drugs of Abuse     Component Value Date/Time   LABOPIA NONE DETECTED 02/19/2014 1604   COCAINSCRNUR NONE DETECTED 02/19/2014 1604   LABBENZ NONE DETECTED 02/19/2014 1604   AMPHETMU NONE DETECTED 02/19/2014 1604   THCU NONE DETECTED 02/19/2014 1604   LABBARB NONE DETECTED 02/19/2014 1604    Alcohol Level:  Recent Labs Lab 02/19/14 1449  ETH <11   Urinalysis:  Recent Labs Lab 02/19/14 [place]  LABSPEC 1.024  PHURINE 6.0  [place]  NEGATIVE  BILIRUBINUR NEGATIVE  KETONESUR NEGATIVE  PROTEINUR NEGATIVE  UROBILINOGEN 0.2  NITRITE NEGATIVE  LEUKOCYTESUR NEGATIVE    Micro Results: No results found for this or any previous visit (from the past 240 hour(s)).  Studies/Results: Dg Chest 2 View  02/20/2014   CLINICAL DATA:  CVA.  EXAM: CHEST  2 VIEW  COMPARISON:  Chest radiograph performed 12/22/2013  FINDINGS: The lungs are hypoexpanded. No pleural effusion or pneumothorax is seen. There is no evidence of focal opacification, pleural effusion or pneumothorax.  The heart is borderline normal in size. No acute osseous abnormalities are seen.  IMPRESSION: Lungs hypoexpanded but grossly clear.   Electronically Signed   By: Garald Balding M.D.   On: 02/20/2014 02:22   Ct Head Wo Contrast  02/19/2014   CLINICAL DATA:  48 year old male with headache, left blurred vision and left facial weakness. Code stroke.  EXAM: CT HEAD WITHOUT CONTRAST   TECHNIQUE: Contiguous axial images were obtained from the base of the skull through the vertex without intravenous contrast.  COMPARISON:  None.  FINDINGS: No intracranial abnormalities are identified, including mass lesion or mass effect, hydrocephalus, extra-axial fluid collection, midline shift, hemorrhage, or acute infarction.  The visualized bony calvarium is unremarkable.  IMPRESSION: Unremarkable noncontrast head CT.  Critical Value/emergent results were called by telephone at the time of interpretation on 02/19/2014 at 3:08 pm to Dr. Nicole Kindred, who verbally acknowledged these results.   Electronically Signed   By: Hassan Rowan M.D.   On: 02/19/2014 15:09   Mr Brain Wo Contrast  02/19/2014   CLINICAL DATA:  48 year old male presenting with Acute onset of left facial numbness and blurred vision. Initial encounter.  EXAM: MRI HEAD WITHOUT CONTRAST  MRA HEAD WITHOUT CONTRAST  TECHNIQUE: Multiplanar, multiecho pulse sequences of the brain and surrounding structures were obtained without intravenous contrast. Angiographic images of the head were obtained using MRA technique without contrast.  COMPARISON:  02/19/2014.  FINDINGS: MRI HEAD FINDINGS  Exam is limited to diffusion sequence and sagittal T1 sequence. Patient requested exam be terminated at this point.  No acute infarct.  No obvious intracranial hemorrhage or intracranial mass noted on this limited exam.  Cervical medullary junction, pituitary region and pineal region unremarkable. No hydrocephalus.  MRA HEAD FINDINGS  Anterior circulation without medium or large size vessel significant stenosis or occlusion.  Diminutive size right vertebral artery which appears to end in a posterior inferior cerebellar artery distribution. Narrowing and irregularity of the right vertebral artery and right posterior inferior cerebellar artery.  Ectatic left vertebral artery without significant narrowing.  No significant narrowing of the basilar artery.  Non visualized right  anterior inferior cerebellar artery.  Mild narrowing superior cerebellar artery bilaterally.  Mild irregularity posterior cerebral artery distal branches.  No aneurysm detected.  IMPRESSION: MRI HEAD :  Exam is limited to diffusion sequence and sagittal T1 sequence. Patient requested exam be terminated at this point.  No acute infarct.  No obvious intracranial hemorrhage or intracranial mass noted on this limited exam.  MRA HEAD:  Anterior circulation without medium or large size vessel significant stenosis or occlusion.  Diminutive size right vertebral artery which appears to end in a posterior inferior cerebellar artery distribution. Narrowing and irregularity of the right vertebral artery and right posterior inferior cerebellar artery.  Please see above.   Electronically Signed   By: Chauncey Cruel M.D.   On: 02/19/2014 19:52   Mr Jodene Nam Head/brain Wo Cm  02/19/2014   CLINICAL DATA:  48 year old  male presenting with Acute onset of left facial numbness and blurred vision. Initial encounter.  EXAM: MRI HEAD WITHOUT CONTRAST  MRA HEAD WITHOUT CONTRAST  TECHNIQUE: Multiplanar, multiecho pulse sequences of the brain and surrounding structures were obtained without intravenous contrast. Angiographic images of the head were obtained using MRA technique without contrast.  COMPARISON:  02/19/2014.  FINDINGS: MRI HEAD FINDINGS  Exam is limited to diffusion sequence and sagittal T1 sequence. Patient requested exam be terminated at this point.  No acute infarct.  No obvious intracranial hemorrhage or intracranial mass noted on this limited exam.  Cervical medullary junction, pituitary region and pineal region unremarkable. No hydrocephalus.  MRA HEAD FINDINGS  Anterior circulation without medium or large size vessel significant stenosis or occlusion.  Diminutive size right vertebral artery which appears to end in a posterior inferior cerebellar artery distribution. Narrowing and irregularity of the right vertebral artery and  right posterior inferior cerebellar artery.  Ectatic left vertebral artery without significant narrowing.  No significant narrowing of the basilar artery.  Non visualized right anterior inferior cerebellar artery.  Mild narrowing superior cerebellar artery bilaterally.  Mild irregularity posterior cerebral artery distal branches.  No aneurysm detected.  IMPRESSION: MRI HEAD :  Exam is limited to diffusion sequence and sagittal T1 sequence. Patient requested exam be terminated at this point.  No acute infarct.  No obvious intracranial hemorrhage or intracranial mass noted on this limited exam.  MRA HEAD:  Anterior circulation without medium or large size vessel significant stenosis or occlusion.  Diminutive size right vertebral artery which appears to end in a posterior inferior cerebellar artery distribution. Narrowing and irregularity of the right vertebral artery and right posterior inferior cerebellar artery.  Please see above.   Electronically Signed   By: Chauncey Cruel M.D.   On: 02/19/2014 19:52   Medications: I have reviewed the patient's current medications. Scheduled Meds: . aspirin  300 mg Rectal Daily   Or  . aspirin  325 mg Oral Daily  . atorvastatin  40 mg Oral q1800  . heparin  5,000 Units Subcutaneous 3 times per day   Continuous Infusions:  PRN Meds:.acetaminophen, senna-docusate Assessment/Plan: Mr. Laton is a 48yo man w/ PMHx of peptic ulcer disease and history of left acetabular fracture in 2005 who presented to the ED with new onset left-sided facial numbness and blurry vision.   Possible TIA: Patient presented with new onset left facial numbness, left blurry vision, and left leg weakness. Pt also with 3 day history of right sided headaches in occipital/temporal region. CT head negative. Vital signs stable on admission. MRI/MRA neg for acute stroke but MRA showed irregular right vertebral artery flow. Lipid panel shows elevated LDL 134. HbA1c 6.3. ECHO w/o embolic source. Today with  change in symptoms with difficulty chewing and food loss from both sides of the mouth and drooling bilaterally. No trouble swallowing. Neurology was called and Dr. Nicole Kindred felt that the patient's symptoms were very inconsistent and were likely not from TIA/CVA. He recommedned checking a CT head w/o contrast and if it was negative, no further w/u is needed. Carotids dopplers are still pending.  - CT head w/o contrast - Carotid dopplers pending - PT/OT/SLP consulted---> SLP recommends home SLP, PT/OT recommends no f/u - Aspirin 325 mg daily  - Cardiac monitoring  - Neuro checks Q2H   Chest pain: 1 episode of left sided chest pressure x1 hour overnight. EKG normal. Troponin negative x1. Will repeat 1 more troponin today.   Diet: Regular  DVT/PE PPx: Heparin SQ   Dispo: If Neuro w/u is negative, will d/c to home today.   The patient does have a current PCP (No Pcp Per Patient) and may need an [place] follow-up appointment after discharge.  The patient does not have transportation limitations that hinder transportation to clinic appointments.  .Services Needed at time of discharge: Y = Yes, Blank = No PT:   OT:   RN:   Equipment:   Other:     LOS: 2 days   Otho Bellows, MD 02/21/2014, 1:55 PM

## 2014-02-21 NOTE — Progress Notes (Signed)
VASCULAR LAB PRELIMINARY  PRELIMINARY  PRELIMINARY  PRELIMINARY  Carotid Dopplers completed.    Preliminary report:  1-39% ICA stenosis.  Vertebral artery flow is antegrade.   Tyson Masin, RVT 02/21/2014, 3:02 PM

## 2014-02-21 NOTE — Progress Notes (Signed)
Internal Medicine Night Float Interim Progress Note  S: Called by nursing at 0250 due to patient having chest pain.  Went to bedside, patient says he had chest tightness in his mid and left chest radiating to his left arm.  This resolved within minutes, and he currently only has mild discomfort.  O: Filed Vitals:   02/21/14 0313  BP: 125/70  Pulse: 73  Temp: 97.9 F (36.6 C)  Resp: 18   Physical Exam  Constitutional: He is well-developed, well-nourished, and in no distress. No distress.  Pulmonary/Chest: Effort normal.  Skin: He is not diaphoretic.   EKG: NSR  A/P: Chest pain resolved, low concern for ACS with normal EKG and no past medical history.  Vital signs stable. -Troponin ordered. -Continue telemetry. -Notify MD if additional chest pain.  Arman Filter, MD, PhD Internal Medicine Intern Pager: [phone] 02/21/2014,3:33 AM

## 2014-02-21 NOTE — Progress Notes (Signed)
MD paged because patient threw up and is not feeling well.  Pt. C/o of not being able to chew his meat properly, but was able to eat his soft foods without any problems.  MD on the way to see the patient.  Will continue to monitor patient.

## 2014-02-23 NOTE — Discharge Summary (Signed)
INTERNAL MEDICINE ATTENDING DISCHARGE COSIGN   I evaluated the patient on the day of discharge and discussed the discharge plan with my resident team. I agree with the discharge documentation and disposition.   [name] 02/23/2014, 8:59 AM

## 2014-02-23 NOTE — Progress Notes (Signed)
OT NOTE- LATE GCODE ENTRY   02/20/14 1531  OT G-codes **NOT FOR INPATIENT CLASS**  Functional Assessment Tool Used clinical judgement  Functional Limitation Self care  Self Care Current Status (G2563) CI  Self Care Goal Status (S9373) CI          Jeri Modena   OTR/L Pager: [phone] Office: [phone] .

## 2014-02-25 ENCOUNTER — Encounter: Payer: Self-pay | Admitting: Family Medicine

## 2014-02-27 ENCOUNTER — Ambulatory Visit (INDEPENDENT_AMBULATORY_CARE_PROVIDER_SITE_OTHER): Payer: Commercial Managed Care - PPO | Admitting: Family Medicine

## 2014-02-27 ENCOUNTER — Encounter: Payer: Self-pay | Admitting: Family Medicine

## 2014-02-27 VITALS — BP 126/84 | HR 76 | Temp 97.9°F | Resp 18 | Ht 73.5 in | Wt 327.0 lb

## 2014-02-27 DIAGNOSIS — Z09 Encounter for follow-up examination after completed treatment for conditions other than malignant neoplasm: Secondary | ICD-10-CM

## 2014-02-27 NOTE — Progress Notes (Signed)
Subjective:    Patient ID: [name], male    DOB: 09/26/1965, 48 y.o.   MRN: [id number]  HPI Patient was recently admitted to the hospital.  I have copied portions of the discharge summary and included them below for my reference: Date of Admission: 02/19/2014 2:48 PM  Date of Discharge: 02/21/2014  Attending Physician: Bartholomew Crews, MD  Discharge Diagnosis:  TIA (transient ischemic attack)  HLD (hyperlipidemia)  Chest Pressure  Hyperglycemia  Discharge Medications:    Medication List     STOP taking these medications       ALEVE 220 MG tablet    Generic drug: naproxen sodium    BAYER BACK & BODY PAIN EX ST 500-32.5 MG Tabs    Generic drug: Aspirin-Caffeine    oxymetazoline 0.05 % nasal spray    Commonly known as: AFRIN     TAKE these medications       aspirin EC 81 MG tablet    Take 1 tablet (81 mg total) by mouth daily.    atorvastatin 40 MG tablet    Commonly known as: LIPITOR    Take 1 tablet (40 mg total) by mouth daily at 6 PM.     Disposition and follow-up:  [name] was discharged from [facility] in Stable condition. At the hospital follow up visit please address:  1. If he has had any other episodes of numbness of the face or trouble chewing  2. Labs / imaging needed at time of follow-up: Monitor blood glucose levels, A1c 6.3  3. Pending labs/ test needing follow-up: None  Follow-up Appointments:  Follow-up Information    Follow up with [facility] T[facility], MD. Schedule an appointment as soon as possible for a visit in 3 days.    Specialty: Family Medicine    Contact information:    [address] [place] [address]  [phone]     Discharge Instructions:  Discharge Instructions    Activity as tolerated - No restrictions  Complete by: As directed       Call MD for: extreme fatigue  Complete by: As directed       Call MD for: persistant dizziness or light-headedness  Complete by: As directed       Call MD  for: persistant nausea and vomiting  Complete by: As directed       Diet - low sodium heart healthy  Complete by: As directed       Discharge instructions  Complete by: As directed    Continue to take the aspirin 81mg  daily and the atorvastatin daily.  Try to get some good rest over the next few days and return to work on Monday.         Consultations: Neurology  Procedures Performed:  Dg Chest 2 View  02/20/2014 CLINICAL DATA: CVA. EXAM: CHEST 2 VIEW COMPARISON: Chest radiograph performed 12/22/2013 FINDINGS: The lungs are hypoexpanded. No pleural effusion or pneumothorax is seen. There is no evidence of focal opacification, pleural effusion or pneumothorax. The heart is borderline normal in size. No acute osseous abnormalities are seen. IMPRESSION: Lungs hypoexpanded but grossly clear. Electronically Signed By: Garald Balding M.D. On: 02/20/2014 02:22  Ct Head Wo Contrast  02/21/2014 CLINICAL DATA: Left facial numbness. Concern for TIA. EXAM: CT HEAD WITHOUT CONTRAST TECHNIQUE: Contiguous axial images were obtained from the base of the skull through the vertex without intravenous contrast. COMPARISON: Brain CT 02/19/2014 FINDINGS: There is no intra or extra-axial fluid  collection or mass lesion. The basilar cisterns and ventricles have a normal appearance. There is no CT evidence for acute infarction or hemorrhage. Paranasal sinuses are unremarkable. Mastoid air cells are well aerated. Calvarium is intact. IMPRESSION: No acute intracranial process. Electronically Signed By: Lovey Newcomer M.D. On: 02/21/2014 14:13  Ct Head Wo Contrast  02/19/2014 CLINICAL DATA: 48 year old male with headache, left blurred vision and left facial weakness. Code stroke. EXAM: CT HEAD WITHOUT CONTRAST TECHNIQUE: Contiguous axial images were obtained from the base of the skull through the vertex without intravenous contrast. COMPARISON: None. FINDINGS: No intracranial abnormalities are identified, including mass lesion or mass  effect, hydrocephalus, extra-axial fluid collection, midline shift, hemorrhage, or acute infarction. The visualized bony calvarium is unremarkable. IMPRESSION: Unremarkable noncontrast head CT. Critical Value/emergent results were called by telephone at the time of interpretation on 02/19/2014 at 3:08 pm to Dr. Nicole Kindred, who verbally acknowledged these results. Electronically Signed By: Hassan Rowan M.D. On: 02/19/2014 15:09  Mr Brain Wo Contrast  02/19/2014 CLINICAL DATA: 48 year old male presenting with Acute onset of left facial numbness and blurred vision. Initial encounter. EXAM: MRI HEAD WITHOUT CONTRAST MRA HEAD WITHOUT CONTRAST TECHNIQUE: Multiplanar, multiecho pulse sequences of the brain and surrounding structures were obtained without intravenous contrast. Angiographic images of the head were obtained using MRA technique without contrast. COMPARISON: 02/19/2014. FINDINGS: MRI HEAD FINDINGS Exam is limited to diffusion sequence and sagittal T1 sequence. Patient requested exam be terminated at this point. No acute infarct. No obvious intracranial hemorrhage or intracranial mass noted on this limited exam. Cervical medullary junction, pituitary region and pineal region unremarkable. No hydrocephalus. MRA HEAD FINDINGS Anterior circulation without medium or large size vessel significant stenosis or occlusion. Diminutive size right vertebral artery which appears to end in a posterior inferior cerebellar artery distribution. Narrowing and irregularity of the right vertebral artery and right posterior inferior cerebellar artery. Ectatic left vertebral artery without significant narrowing. No significant narrowing of the basilar artery. Non visualized right anterior inferior cerebellar artery. Mild narrowing superior cerebellar artery bilaterally. Mild irregularity posterior cerebral artery distal branches. No aneurysm detected. IMPRESSION: MRI HEAD : Exam is limited to diffusion sequence and sagittal T1 sequence.  Patient requested exam be terminated at this point. No acute infarct. No obvious intracranial hemorrhage or intracranial mass noted on this limited exam. MRA HEAD: Anterior circulation without medium or large size vessel significant stenosis or occlusion. Diminutive size right vertebral artery which appears to end in a posterior inferior cerebellar artery distribution. Narrowing and irregularity of the right vertebral artery and right posterior inferior cerebellar artery. Please see above. Electronically Signed By: Chauncey Cruel M.D. On: 02/19/2014 19:52  Mr Jodene Nam Head/brain Wo Cm  02/19/2014 CLINICAL DATA: 48 year old male presenting with Acute onset of left facial numbness and blurred vision. Initial encounter. EXAM: MRI HEAD WITHOUT CONTRAST MRA HEAD WITHOUT CONTRAST TECHNIQUE: Multiplanar, multiecho pulse sequences of the brain and surrounding structures were obtained without intravenous contrast. Angiographic images of the head were obtained using MRA technique without contrast. COMPARISON: 02/19/2014. FINDINGS: MRI HEAD FINDINGS Exam is limited to diffusion sequence and sagittal T1 sequence. Patient requested exam be terminated at this point. No acute infarct. No obvious intracranial hemorrhage or intracranial mass noted on this limited exam. Cervical medullary junction, pituitary region and pineal region unremarkable. No hydrocephalus. MRA HEAD FINDINGS Anterior circulation without medium or large size vessel significant stenosis or occlusion. Diminutive size right vertebral artery which appears to end in a posterior inferior cerebellar artery distribution. Narrowing and irregularity of the  right vertebral artery and right posterior inferior cerebellar artery. Ectatic left vertebral artery without significant narrowing. No significant narrowing of the basilar artery. Non visualized right anterior inferior cerebellar artery. Mild narrowing superior cerebellar artery bilaterally. Mild irregularity posterior  cerebral artery distal branches. No aneurysm detected. IMPRESSION: MRI HEAD : Exam is limited to diffusion sequence and sagittal T1 sequence. Patient requested exam be terminated at this point. No acute infarct. No obvious intracranial hemorrhage or intracranial mass noted on this limited exam. MRA HEAD: Anterior circulation without medium or large size vessel significant stenosis or occlusion. Diminutive size right vertebral artery which appears to end in a posterior inferior cerebellar artery distribution. Narrowing and irregularity of the right vertebral artery and right posterior inferior cerebellar artery. Please see above. Electronically Signed By: Chauncey Cruel M.D. On: 02/19/2014 19:52  2D Echo:  02/20/14  Impressions: - Normal biventricular size and function. Abnormal relaxation (grade 1 diastolic dysfunction). Normal filling pressures.  Admission  History of Present Illness: Mr. Desmith is a 49yo man w/ PMHx of peptic ulcer disease and history of left acetabular fracture in 2005 who presented to the ED with new onset left-sided facial numbness and blurry vision. Patient states around 12 PM today, he started to have left facial numbness in his cheek and mouth. He also noted blurry vision in his left eye and weakness of his left leg around the same time. He denies numbness in his tongue, but notes he has had a "waxy" taste for the last 3 days as well as decreased taste. Patient states he has difficulty concentrating. Patient feels that his speech is not slurred, but that he is speaking slower compared to normal. The symptoms are still occurring at this time. Patient also reports a 3 day history of headaches that are located in the right occipital/temporal region. He denies a history of migraines or headaches. He has tried Aleve and [employer] with minimal relief. He denies visual changes when the headaches first started. He states the headaches are worse with light.  In the ED, patient was code  stroke. CT head was negative for acute stroke.  Review of Systems:  General: Denies fever, chills, night sweats, changes in weight, changes in appetite  HEENT: Denies ear pain, rhinorrhea, sore throat  CV: Denies CP, palpitations, SOB, orthopnea  Pulm: Denies SOB, cough, wheezing  GI: Denies abdominal pain, nausea, vomiting, diarrhea, constipation, melena, hematochezia  GU: Denies dysuria, hematuria, frequency  Msk: Denies muscle cramps, joint pains  Neuro: See HPI  Skin: Denies rashes, bruising  Physical Exam:  Blood pressure 132/77, pulse 96, temperature 98.1 F (36.7 C), temperature source Oral, resp. rate 21, height 6\' 2"  (1.88 m), weight 341 lb (154.677 kg), SpO2 96.00%.  General: sitting up in chair, alert, NAD  HEENT: [place]/AT, EOMI, PERRL, sclera anicteric, pharynx non-erythematous, mucus membranes moist  Neck: supple, no JVD, no lymphadenopathy  CV: mildly tachycardic, normal S1/S2, no m/g/r  Pulm: CTA bilaterally, breaths non-labored, no wheezing  Abd: BS+, soft, non-distended, non-tender  Ext: warm, no edema, moves all  Neuro: alert and oriented x 3, speech fluent without aphasia, follows commands appropriately. Mild facial numbness. Difficulty with smiling. Strength 5/5 in upper and lower extremities bilaterally  Hospital Course by problem list:  Mr. Kostick is a 48yo man w/ PMHx of peptic ulcer disease and history of left acetabular fracture in 2005 who presented to the ED with new onset left-sided facial numbness and blurry vision.  Possible TIA: Patient presented with new  onset left facial numbness, left blurry vision, and left leg weakness. Pt also with 3 day history of right sided headaches in occipital/temporal region. CT head negative. Vital signs stable on admission. MRI/MRA neg for acute stroke but MRA showed irregular right vertebral artery flow. Lipid panel shows elevated LDL 134. HbA1c 6.3. ECHO w/o embolic source. Carotid dopplers w/ 1-39% ICA stenosis; antegrade vertebral  artery flow. Neurology signed off on HD#2. On HD#3, pt with with sudden difficulty chewing and food loss from both sides of the mouth with drooling bilaterally. No trouble swallowing. Neurology was called and Dr. Nicole Kindred felt that the patient's symptoms were very inconsistent and were likely not from TIA/CVA. He recommended checking a CT head w/o contrast and if it was negative, no further w/u was needed, as the symptoms were likely not related to neurological deficits. CT head was unremarkable and w/o acute findings. The patient's symptoms had improved prior to discharge, and seemed to be stress related. He was deemed stable for discharge home.  - Recommended continuing ASA 81mg  po daily for primary prevention  - Recommended Lipitor 40mg  po daily  - Pt asked to return to the ED or Urgent Care if his symptoms returned or worsened.  Chest pain: 1 episode of left sided chest pressure x1 hour overnight on 9/18. EKG normal. Troponin negative x2 11 hours apart. No further episodes of pain/pressure prior to discharge.  - Continue ASA 81mg  po daily  In short, the patient developed the sudden onset of left facial numbness and severe right occipital headache. Patient sent to the emergency room patient underwent a workup for stroke. CT the head was negative. Echo was negative. Carotid Dopplers showed 1-39% stenosis bilaterally. While in the hospital, the patient's symptoms improved. However he then developed weakness on the right side of his face particularly around his lips. He also developed some mild word finding aphasia he continues to have a difficult time speaking today. He'll obtain in the hospital was limited due to the patient's claustrophobia however there was no acute stroke seen. He was discharged home on aspirin 81 mg by mouth daily for secondary prevention of stroke. He was also given Lipitor 40 mg by mouth daily for hyperlipidemia. He was found have a hemoglobin A1c mildly elevated at 6.3. He is here  today for followup. He continues to have some mild finding aphasia but is otherwise doing well. He is just changed his diet and has lost approximately 10 pounds since his hospitalization. His blood pressures at home have been variable. He also reports mood swings and mood lability which he attributes to his fear of having a future stroke. Past Medical History  Diagnosis Date  . Allergy   . Ulcer   . Left acetabular fracture     nonoperative around age 25  . Diabetes mellitus without complication 29/47/65  . Hyperlipidemia 02/21/14  . Stroke 02/21/14    TIA   Past Surgical History  Procedure Laterality Date  . Knee arthroscopy w/ meniscal repair      Both knees  . Cosmetic surgery      Dermal tatoo removal face from accident   Current Outpatient Prescriptions on File Prior to Visit  Medication Sig Dispense Refill  . aspirin EC 81 MG tablet Take 1 tablet (81 mg total) by mouth daily.  30 tablet  11  . atorvastatin (LIPITOR) 40 MG tablet Take 1 tablet (40 mg total) by mouth daily at 6 PM.  30 tablet  2   No current  facility-administered medications on file prior to visit.   Allergies  Allergen Reactions  . Demerol [Meperidine] Anaphylaxis  . Oxycodone Rash   History   Social History  . Marital Status: Single    Spouse Name: N/A    Number of Children: N/A  . Years of Education: N/A   Occupational History  . Not on file.   Social History Main Topics  . Smoking status: Former Smoker    Start date: 07/09/1981    Quit date: 07/10/1999  . Smokeless tobacco: Never Used  . Alcohol Use: No  . Drug Use: No  . Sexual Activity: No   Other Topics Concern  . Not on file   Social History Narrative  . No narrative on file   Family History  Problem Relation Age of Onset  . Hyperlipidemia Mother   . Hypertension Mother   . Heart disease Father   . Hyperlipidemia Father   . Hypertension Father   . Heart disease Sister   . Hyperlipidemia Sister   . Hypertension Sister   .  Hyperlipidemia Brother   . Hypertension Brother   . Diabetes Brother   . Cancer Maternal Grandmother   . Hearing loss Maternal Grandmother   . Hypertension Maternal Grandmother   . Cancer Maternal Grandfather   . Cancer Paternal Grandmother   . Heart disease Paternal Grandmother   . Hypertension Paternal Grandmother   . Cancer Paternal Grandfather   . Diabetes Brother   . Heart disease Brother   . Hyperlipidemia Brother   . Hypertension Brother        Review of Systems  All other systems reviewed and are negative.      Objective:   Physical Exam  Vitals reviewed. Constitutional: He appears well-developed and well-nourished.  Cardiovascular: Normal rate, regular rhythm and normal heart sounds.   Pulmonary/Chest: Effort normal and breath sounds normal. No respiratory distress. He has no wheezes. He has no rales. He exhibits no tenderness.  Abdominal: Soft. Bowel sounds are normal. He exhibits no distension and no mass. There is no tenderness. There is no rebound and no guarding.  Musculoskeletal: He exhibits no edema.          Assessment & Plan:  Hospital discharge follow-up   Continue aspirin 81 mg a day for secondary prevention of stroke.-Continue Lipitor 40 mg by mouth daily. I like to recheck his fasting lipid panel in 3 months. His daughter LDL cholesterol is less than 70. To recheck the patient's blood pressure in one week. He continues to have diminished swings and mood lability, may want consider an SSRI for PTSD secondary from stroke.  Also like to recheck the patient's hemoglobin A1c in 3 months. We discussed low carbohydrate diet today. I spent approximately 35 minutes with the patient in direct discussion and patient care.

## 2014-03-06 ENCOUNTER — Encounter: Payer: Self-pay | Admitting: Family Medicine

## 2014-03-06 ENCOUNTER — Ambulatory Visit (INDEPENDENT_AMBULATORY_CARE_PROVIDER_SITE_OTHER): Payer: Commercial Managed Care - PPO | Admitting: Family Medicine

## 2014-03-06 VITALS — BP 118/60 | HR 80 | Temp 98.2°F | Resp 16 | Ht 74.0 in | Wt 327.0 lb

## 2014-03-06 DIAGNOSIS — I10 Essential (primary) hypertension: Secondary | ICD-10-CM

## 2014-03-06 DIAGNOSIS — R002 Palpitations: Secondary | ICD-10-CM

## 2014-03-06 MED ORDER — LOSARTAN POTASSIUM 25 MG PO TABS: 25.0000 mg | ORAL_TABLET | Freq: Every day | ORAL | Status: DC

## 2014-03-06 NOTE — Progress Notes (Signed)
Subjective:    Patient ID: [name], male    DOB: 1965-11-18, 48 y.o.   MRN: [id number]  HPI 02/27/14 Patient was recently admitted to the hospital.  I have copied portions of the discharge summary and included them below for my reference: Date of Admission: 02/19/2014 2:48 PM  Date of Discharge: 02/21/2014  Attending Physician: Bartholomew Crews, MD  Discharge Diagnosis:  TIA (transient ischemic attack)  HLD (hyperlipidemia)  Chest Pressure  Hyperglycemia  Discharge Medications:    Medication List     STOP taking these medications       ALEVE 220 MG tablet    Generic drug: naproxen sodium    BAYER BACK & BODY PAIN EX ST 500-32.5 MG Tabs    Generic drug: Aspirin-Caffeine    oxymetazoline 0.05 % nasal spray    Commonly known as: AFRIN     TAKE these medications       aspirin EC 81 MG tablet    Take 1 tablet (81 mg total) by mouth daily.    atorvastatin 40 MG tablet    Commonly known as: LIPITOR    Take 1 tablet (40 mg total) by mouth daily at 6 PM.     Disposition and follow-up:  [name] was discharged from [facility] in Stable condition. At the hospital follow up visit please address:  1. If he has had any other episodes of numbness of the face or trouble chewing  2. Labs / imaging needed at time of follow-up: Monitor blood glucose levels, A1c 6.3  3. Pending labs/ test needing follow-up: None  Follow-up Appointments:  Follow-up Information    Follow up with [facility] TOM, MD. Schedule an appointment as soon as possible for a visit in 3 days.    Specialty: Family Medicine    Contact information:    [address] [place] [id number]  [phone]     Discharge Instructions:  Discharge Instructions    Activity as tolerated - No restrictions  Complete by: As directed       Call MD for: extreme fatigue  Complete by: As directed       Call MD for: persistant dizziness or light-headedness  Complete by: As directed       Call MD for: persistant nausea and vomiting  Complete by: As directed       Diet - low sodium heart healthy  Complete by: As directed       Discharge instructions  Complete by: As directed    Continue to take the aspirin 81mg  daily and the atorvastatin daily.  Try to get some good rest over the next few days and return to work on Monday.         Consultations: Neurology  Procedures Performed:  Dg Chest 2 View  02/20/2014 CLINICAL DATA: CVA. EXAM: CHEST 2 VIEW COMPARISON: Chest radiograph performed 12/22/2013 FINDINGS: The lungs are hypoexpanded. No pleural effusion or pneumothorax is seen. There is no evidence of focal opacification, pleural effusion or pneumothorax. The heart is borderline normal in size. No acute osseous abnormalities are seen. IMPRESSION: Lungs hypoexpanded but grossly clear. Electronically Signed By: Garald Balding M.D. On: 02/20/2014 02:22  Ct Head Wo Contrast  02/21/2014 CLINICAL DATA: Left facial numbness. Concern for TIA. EXAM: CT HEAD WITHOUT CONTRAST TECHNIQUE: Contiguous axial images were obtained from the base of the skull through the vertex without intravenous contrast. COMPARISON: Brain CT 02/19/2014 FINDINGS: There is no intra or extra-axial fluid  collection or mass lesion. The basilar cisterns and ventricles have a normal appearance. There is no CT evidence for acute infarction or hemorrhage. Paranasal sinuses are unremarkable. Mastoid air cells are well aerated. Calvarium is intact. IMPRESSION: No acute intracranial process. Electronically Signed By: Lovey Newcomer M.D. On: 02/21/2014 14:13  Ct Head Wo Contrast  02/19/2014 CLINICAL DATA: 48 year old male with headache, left blurred vision and left facial weakness. Code stroke. EXAM: CT HEAD WITHOUT CONTRAST TECHNIQUE: Contiguous axial images were obtained from the base of the skull through the vertex without intravenous contrast. COMPARISON: None. FINDINGS: No intracranial abnormalities are identified, including mass lesion  or mass effect, hydrocephalus, extra-axial fluid collection, midline shift, hemorrhage, or acute infarction. The visualized bony calvarium is unremarkable. IMPRESSION: Unremarkable noncontrast head CT. Critical Value/emergent results were called by telephone at the time of interpretation on 02/19/2014 at 3:08 pm to Dr. Nicole Kindred, who verbally acknowledged these results. Electronically Signed By: Hassan Rowan M.D. On: 02/19/2014 15:09  Mr Brain Wo Contrast  02/19/2014 CLINICAL DATA: 48 year old male presenting with Acute onset of left facial numbness and blurred vision. Initial encounter. EXAM: MRI HEAD WITHOUT CONTRAST MRA HEAD WITHOUT CONTRAST TECHNIQUE: Multiplanar, multiecho pulse sequences of the brain and surrounding structures were obtained without intravenous contrast. Angiographic images of the head were obtained using MRA technique without contrast. COMPARISON: 02/19/2014. FINDINGS: MRI HEAD FINDINGS Exam is limited to diffusion sequence and sagittal T1 sequence. Patient requested exam be terminated at this point. No acute infarct. No obvious intracranial hemorrhage or intracranial mass noted on this limited exam. Cervical medullary junction, pituitary region and pineal region unremarkable. No hydrocephalus. MRA HEAD FINDINGS Anterior circulation without medium or large size vessel significant stenosis or occlusion. Diminutive size right vertebral artery which appears to end in a posterior inferior cerebellar artery distribution. Narrowing and irregularity of the right vertebral artery and right posterior inferior cerebellar artery. Ectatic left vertebral artery without significant narrowing. No significant narrowing of the basilar artery. Non visualized right anterior inferior cerebellar artery. Mild narrowing superior cerebellar artery bilaterally. Mild irregularity posterior cerebral artery distal branches. No aneurysm detected. IMPRESSION: MRI HEAD : Exam is limited to diffusion sequence and sagittal T1  sequence. Patient requested exam be terminated at this point. No acute infarct. No obvious intracranial hemorrhage or intracranial mass noted on this limited exam. MRA HEAD: Anterior circulation without medium or large size vessel significant stenosis or occlusion. Diminutive size right vertebral artery which appears to end in a posterior inferior cerebellar artery distribution. Narrowing and irregularity of the right vertebral artery and right posterior inferior cerebellar artery. Please see above. Electronically Signed By: Chauncey Cruel M.D. On: 02/19/2014 19:52  Mr Jodene Nam Head/brain Wo Cm  02/19/2014 CLINICAL DATA: 48 year old male presenting with Acute onset of left facial numbness and blurred vision. Initial encounter. EXAM: MRI HEAD WITHOUT CONTRAST MRA HEAD WITHOUT CONTRAST TECHNIQUE: Multiplanar, multiecho pulse sequences of the brain and surrounding structures were obtained without intravenous contrast. Angiographic images of the head were obtained using MRA technique without contrast. COMPARISON: 02/19/2014. FINDINGS: MRI HEAD FINDINGS Exam is limited to diffusion sequence and sagittal T1 sequence. Patient requested exam be terminated at this point. No acute infarct. No obvious intracranial hemorrhage or intracranial mass noted on this limited exam. Cervical medullary junction, pituitary region and pineal region unremarkable. No hydrocephalus. MRA HEAD FINDINGS Anterior circulation without medium or large size vessel significant stenosis or occlusion. Diminutive size right vertebral artery which appears to end in a posterior inferior cerebellar artery distribution. Narrowing and irregularity of the  right vertebral artery and right posterior inferior cerebellar artery. Ectatic left vertebral artery without significant narrowing. No significant narrowing of the basilar artery. Non visualized right anterior inferior cerebellar artery. Mild narrowing superior cerebellar artery bilaterally. Mild irregularity  posterior cerebral artery distal branches. No aneurysm detected. IMPRESSION: MRI HEAD : Exam is limited to diffusion sequence and sagittal T1 sequence. Patient requested exam be terminated at this point. No acute infarct. No obvious intracranial hemorrhage or intracranial mass noted on this limited exam. MRA HEAD: Anterior circulation without medium or large size vessel significant stenosis or occlusion. Diminutive size right vertebral artery which appears to end in a posterior inferior cerebellar artery distribution. Narrowing and irregularity of the right vertebral artery and right posterior inferior cerebellar artery. Please see above. Electronically Signed By: Chauncey Cruel M.D. On: 02/19/2014 19:52  2D Echo:  02/20/14  Impressions: - Normal biventricular size and function. Abnormal relaxation (grade 1 diastolic dysfunction). Normal filling pressures.  Admission  History of Present Illness: [name] is a 48yo man w/ PMHx of peptic ulcer disease and history of left acetabular fracture in 2005 who presented to the ED with new onset left-sided facial numbness and blurry vision. Patient states around 12 PM today, he started to have left facial numbness in his cheek and mouth. He also noted blurry vision in his left eye and weakness of his left leg around the same time. He denies numbness in his tongue, but notes he has had a "waxy" taste for the last 3 days as well as decreased taste. Patient states he has difficulty concentrating. Patient feels that his speech is not slurred, but that he is speaking slower compared to normal. The symptoms are still occurring at this time. Patient also reports a 3 day history of headaches that are located in the right occipital/temporal region. He denies a history of migraines or headaches. He has tried Aleve and [employer] with minimal relief. He denies visual changes when the headaches first started. He states the headaches are worse with light.  In the ED, patient  was code stroke. CT head was negative for acute stroke.  Review of Systems:  General: Denies fever, chills, night sweats, changes in weight, changes in appetite  HEENT: Denies ear pain, rhinorrhea, sore throat  CV: Denies CP, palpitations, SOB, orthopnea  Pulm: Denies SOB, cough, wheezing  GI: Denies abdominal pain, nausea, vomiting, diarrhea, constipation, melena, hematochezia  GU: Denies dysuria, hematuria, frequency  Msk: Denies muscle cramps, joint pains  Neuro: See HPI  Skin: Denies rashes, bruising  Physical Exam:  Blood pressure 132/77, pulse 96, temperature 98.1 F (36.7 C), temperature source Oral, resp. rate 21, height 6\' 2"  (1.88 m), weight 341 lb (154.677 kg), SpO2 96.00%.  General: sitting up in chair, alert, NAD  HEENT: [place]/AT, EOMI, PERRL, sclera anicteric, pharynx non-erythematous, mucus membranes moist  Neck: supple, no JVD, no lymphadenopathy  CV: mildly tachycardic, normal S1/S2, no m/g/r  Pulm: CTA bilaterally, breaths non-labored, no wheezing  Abd: BS+, soft, non-distended, non-tender  Ext: warm, no edema, moves all  Neuro: alert and oriented x 3, speech fluent without aphasia, follows commands appropriately. Mild facial numbness. Difficulty with smiling. Strength 5/5 in upper and lower extremities bilaterally  Hospital Course by problem list:  [name] is a 48yo man w/ PMHx of peptic ulcer disease and history of left acetabular fracture in 2005 who presented to the ED with new onset left-sided facial numbness and blurry vision.  Possible TIA: Patient presented with new  onset left facial numbness, left blurry vision, and left leg weakness. Pt also with 3 day history of right sided headaches in occipital/temporal region. CT head negative. Vital signs stable on admission. MRI/MRA neg for acute stroke but MRA showed irregular right vertebral artery flow. Lipid panel shows elevated LDL 134. HbA1c 6.3. ECHO w/o embolic source. Carotid dopplers w/ 1-39% ICA stenosis; antegrade  vertebral artery flow. Neurology signed off on HD#2. On HD#3, pt with with sudden difficulty chewing and food loss from both sides of the mouth with drooling bilaterally. No trouble swallowing. Neurology was called and Dr. Nicole Kindred felt that the patient's symptoms were very inconsistent and were likely not from TIA/CVA. He recommended checking a CT head w/o contrast and if it was negative, no further w/u was needed, as the symptoms were likely not related to neurological deficits. CT head was unremarkable and w/o acute findings. The patient's symptoms had improved prior to discharge, and seemed to be stress related. He was deemed stable for discharge home.  - Recommended continuing ASA 81mg  po daily for primary prevention  - Recommended Lipitor 40mg  po daily  - Pt asked to return to the ED or Urgent Care if his symptoms returned or worsened.  Chest pain: 1 episode of left sided chest pressure x1 hour overnight on 9/18. EKG normal. Troponin negative x2 11 hours apart. No further episodes of pain/pressure prior to discharge.  - Continue ASA 81mg  po daily  In short, the patient developed the sudden onset of left facial numbness and severe right occipital headache. Patient sent to the emergency room patient underwent a workup for stroke. CT the head was negative. Echo was negative. Carotid Dopplers showed 1-39% stenosis bilaterally. While in the hospital, the patient's symptoms improved. However he then developed weakness on the right side of his face particularly around his lips. He also developed some mild word finding aphasia he continues to have a difficult time speaking today. He'll obtain in the hospital was limited due to the patient's claustrophobia however there was no acute stroke seen. He was discharged home on aspirin 81 mg by mouth daily for secondary prevention of stroke. He was also given Lipitor 40 mg by mouth daily for hyperlipidemia. He was found have a hemoglobin A1c mildly elevated at 6.3. He  is here today for followup. He continues to have some mild finding aphasia but is otherwise doing well. He is just changed his diet and has lost approximately 10 pounds since his hospitalization. His blood pressures at home have been variable. He also reports mood swings and mood lability which he attributes to his fear of having a future stroke.  At that time, my plan was:  Continue aspirin 81 mg a day for secondary prevention of stroke.-Continue Lipitor 40 mg by mouth daily. I like to recheck his fasting lipid panel in 3 months. His daughter LDL cholesterol is less than 70. To recheck the patient's blood pressure in one week. He continues to have diminished swings and mood lability, may want consider an SSRI for PTSD secondary from stroke.  Also like to recheck the patient's hemoglobin A1c in 3 months. We discussed low carbohydrate diet today. I spent approximately 35 minutes with the patient in direct discussion and patient care.  03/06/14  Patient is here today for followup. His blood pressure has been ranging 130to 145/80-95. His blood sugars have all been less than 120. He is checking his blood sugar 3 times a day. Overall he is doing well. His mood swings  have improved. Unfortunately he reports episodes of palpitations in his chest or his heart begins to race. This occurs a couple times per week. It lasted 3 minutes every time and occurs. He describes it as a sustained rapid heart rate. Past Medical History  Diagnosis Date  . Allergy   . Ulcer   . Left acetabular fracture     nonoperative around age 20  . Diabetes mellitus without complication 56/43/32  . Hyperlipidemia 02/21/14  . Stroke 02/21/14    TIA   Past Surgical History  Procedure Laterality Date  . Knee arthroscopy w/ meniscal repair      Both knees  . Cosmetic surgery      Dermal tatoo removal face from accident   Current Outpatient Prescriptions on File Prior to Visit  Medication Sig Dispense Refill  . aspirin EC 81 MG  tablet Take 1 tablet (81 mg total) by mouth daily.  30 tablet  11  . atorvastatin (LIPITOR) 40 MG tablet Take 1 tablet (40 mg total) by mouth daily at 6 PM.  30 tablet  2  . diphenhydramine-acetaminophen (TYLENOL PM) 25-500 MG TABS Take 2 tablets by mouth at bedtime as needed.       No current facility-administered medications on file prior to visit.   Allergies  Allergen Reactions  . Demerol [Meperidine] Anaphylaxis  . Oxycodone Rash   History   Social History  . Marital Status: Single    Spouse Name: N/A    Number of Children: N/A  . Years of Education: N/A   Occupational History  . Not on file.   Social History Main Topics  . Smoking status: Former Smoker    Start date: 07/09/1981    Quit date: 07/10/1999  . Smokeless tobacco: Never Used  . Alcohol Use: No  . Drug Use: No  . Sexual Activity: No   Other Topics Concern  . Not on file   Social History Narrative  . No narrative on file   Family History  Problem Relation Age of Onset  . Hyperlipidemia Mother   . Hypertension Mother   . Heart disease Father   . Hyperlipidemia Father   . Hypertension Father   . Heart disease Sister   . Hyperlipidemia Sister   . Hypertension Sister   . Hyperlipidemia Brother   . Hypertension Brother   . Diabetes Brother   . Cancer Maternal Grandmother   . Hearing loss Maternal Grandmother   . Hypertension Maternal Grandmother   . Cancer Maternal Grandfather   . Cancer Paternal Grandmother   . Heart disease Paternal Grandmother   . Hypertension Paternal Grandmother   . Cancer Paternal Grandfather   . Diabetes Brother   . Heart disease Brother   . Hyperlipidemia Brother   . Hypertension Brother        Review of Systems  All other systems reviewed and are negative.      Objective:   Physical Exam  Vitals reviewed. Constitutional: He appears well-developed and well-nourished.  Cardiovascular: Normal rate, regular rhythm and normal heart sounds.   Pulmonary/Chest:  Effort normal and breath sounds normal. No respiratory distress. He has no wheezes. He has no rales. He exhibits no tenderness.  Abdominal: Soft. Bowel sounds are normal. He exhibits no distension and no mass. There is no tenderness. There is no rebound and no guarding.  Musculoskeletal: He exhibits no edema.          Assessment & Plan:  Benign essential HTN - Plan: losartan (COZAAR) 25  MG tablet  Heart palpitations - Plan: Ambulatory referral to Cardiology  Patient's blood pressure is not ideal. Therefore I will start him on losartan 25 mg by mouth daily. Recheck blood pressure in 2 weeks. I would like to see the patient back in 3 months to recheck a hemoglobin A1c along with a fasting lipid panel. Given his recent TIA and episodes of arrhythmias, I believe he requires a cardiology consultation to rule out paroxysmal atrial fibrillation. Obviously if the patient has this arrhythmia he would require a different plan for anticoagulation for secondary stroke prevention.

## 2014-03-16 ENCOUNTER — Encounter: Payer: Self-pay | Admitting: Family Medicine

## 2014-03-24 MED ORDER — LOSARTAN POTASSIUM 50 MG PO TABS: 50.0000 mg | ORAL_TABLET | Freq: Every day | ORAL | Status: DC

## 2014-04-01 ENCOUNTER — Encounter: Payer: Self-pay | Admitting: Family Medicine

## 2014-04-07 ENCOUNTER — Encounter: Payer: Self-pay | Admitting: Family Medicine

## 2014-04-07 ENCOUNTER — Ambulatory Visit (INDEPENDENT_AMBULATORY_CARE_PROVIDER_SITE_OTHER): Payer: Commercial Managed Care - PPO | Admitting: Family Medicine

## 2014-04-07 VITALS — BP 126/68 | HR 78 | Temp 98.1°F | Resp 16 | Ht 74.0 in | Wt 316.0 lb

## 2014-04-07 DIAGNOSIS — R109 Unspecified abdominal pain: Secondary | ICD-10-CM

## 2014-04-07 DIAGNOSIS — I10 Essential (primary) hypertension: Secondary | ICD-10-CM

## 2014-04-07 LAB — URINALYSIS, MICROSCOPIC ONLY
CASTS: NONE SEEN
Crystals: NONE SEEN
WBC, UA: NONE SEEN WBC/hpf (ref <3)

## 2014-04-07 LAB — URINALYSIS, ROUTINE W REFLEX MICROSCOPIC
Glucose, UA: NEGATIVE mg/dL
KETONES UR: 15 mg/dL — AB
Leukocytes, UA: NEGATIVE
Nitrite: NEGATIVE
PH: 5.5 (ref 5.0–8.0)
Protein, ur: NEGATIVE mg/dL
Specific Gravity, Urine: 1.025 (ref 1.005–1.030)
Urobilinogen, UA: 0.2 mg/dL (ref 0.0–1.0)

## 2014-04-07 MED ORDER — HYDROCHLOROTHIAZIDE 25 MG PO TABS: 25.0000 mg | ORAL_TABLET | Freq: Every day | ORAL | Status: DC

## 2014-04-07 NOTE — Progress Notes (Signed)
Subjective:    Patient ID: [name], male    DOB: 1965-11-11, 48 y.o.   MRN: [id number]  HPI Patient presents today continuing to have elevated blood pressure.  The majority of his values range from 384 665 systolic over 99-35 diastolic. He is currently taking losartan 50 mg by mouth daily. He is also complaining of right lower flank pain. There are no exacerbating or alleviating factors. He does complain of tinted urine and dark tea-colored urine at times. He denies any dysuria or fever. He denies any nausea or vomiting.  Since pain is constant. It is not coming go and spasms. It is exacerbated by prolonged standing. He does have a history of hematuria dating back to his time in the [employer]. He has had cystoscopies, bilateral retrograde pyelograms, CT scans which have revealed no source of his hematuria. On examination today there is microscopic hematuria. Past Medical History  Diagnosis Date  . Allergy   . Ulcer   . Left acetabular fracture     nonoperative around age 19  . Diabetes mellitus without complication 70/17/79  . Hyperlipidemia 02/21/14  . Stroke 02/21/14    TIA   Past Surgical History  Procedure Laterality Date  . Knee arthroscopy w/ meniscal repair      Both knees  . Cosmetic surgery      Dermal tatoo removal face from accident   Current Outpatient Prescriptions on File Prior to Visit  Medication Sig Dispense Refill  . aspirin EC 81 MG tablet Take 1 tablet (81 mg total) by mouth daily. 30 tablet 11  . diphenhydramine-acetaminophen (TYLENOL PM) 25-500 MG TABS Take 2 tablets by mouth at bedtime as needed.    Marland Kitchen losartan (COZAAR) 50 MG tablet Take 1 tablet (50 mg total) by mouth daily. 30 tablet 3   No current facility-administered medications on file prior to visit.   Allergies  Allergen Reactions  . Demerol [Meperidine] Anaphylaxis  . Oxycodone Rash   History   Social History  . Marital Status: Single    Spouse Name: N/A    Number of Children: N/A  . Years  of Education: N/A   Occupational History  . Not on file.   Social History Main Topics  . Smoking status: Former Smoker    Start date: 07/09/1981    Quit date: 07/10/1999  . Smokeless tobacco: Never Used  . Alcohol Use: No  . Drug Use: No  . Sexual Activity: No   Other Topics Concern  . Not on file   Social History Narrative      Review of Systems  All other systems reviewed and are negative.      Objective:   Physical Exam  Constitutional: He appears well-developed and well-nourished.  HENT:  Right Ear: External ear normal.  Left Ear: External ear normal.  Nose: Nose normal.  Mouth/Throat: Oropharynx is clear and moist.  Eyes: Conjunctivae are normal. No scleral icterus.  Neck: Neck supple. No JVD present. No thyromegaly present.  Cardiovascular: Normal rate, regular rhythm and normal heart sounds.   No murmur heard. Pulmonary/Chest: Effort normal and breath sounds normal. No respiratory distress. He has no wheezes. He has no rales. He exhibits no tenderness.  Abdominal: Soft. Bowel sounds are normal. He exhibits no distension. There is no tenderness. There is no rebound and no guarding.  Musculoskeletal: He exhibits no edema or tenderness.       Lumbar back: He exhibits pain. He exhibits no tenderness and no bony tenderness.  Lymphadenopathy:  He has no cervical adenopathy.  Vitals reviewed.         Assessment & Plan:  Right flank pain - Plan: Urinalysis, Routine w reflex microscopic, DG Lumbar Spine Complete  Benign essential HTN - Plan: hydrochlorothiazide (HYDRODIURIL) 25 MG tablet  I believe the patient's right flank pain is musculoskeletal. I have recommended that he go for an x-ray of his lumbar spine. If the x-ray of his lumbar spine is completely normal, I would begin with a CT scan of the abdomen and pelvis to evaluate his hematuria further. His blood pressure remains elevated and therefore I'll add hydrochlorothiazide 25 mg by mouth daily to his  losartan.

## 2014-04-09 ENCOUNTER — Ambulatory Visit
Admission: RE | Admit: 2014-04-09 | Discharge: 2014-04-09 | Disposition: A | Discharge status: Home / Self Care | Payer: Commercial Managed Care - PPO | Source: Ambulatory Visit | Attending: Family Medicine | Admitting: Family Medicine

## 2014-04-09 DIAGNOSIS — R109 Unspecified abdominal pain: Secondary | ICD-10-CM

## 2014-04-13 ENCOUNTER — Encounter: Payer: Self-pay | Admitting: Family Medicine

## 2014-04-20 ENCOUNTER — Encounter: Payer: Self-pay | Admitting: Cardiovascular Disease

## 2014-04-20 ENCOUNTER — Ambulatory Visit (INDEPENDENT_AMBULATORY_CARE_PROVIDER_SITE_OTHER): Payer: Commercial Managed Care - PPO | Admitting: Cardiovascular Disease

## 2014-04-20 VITALS — BP 128/78 | HR 81 | Ht 74.0 in | Wt 322.8 lb

## 2014-04-20 DIAGNOSIS — R739 Hyperglycemia, unspecified: Secondary | ICD-10-CM

## 2014-04-20 DIAGNOSIS — E785 Hyperlipidemia, unspecified: Secondary | ICD-10-CM

## 2014-04-20 DIAGNOSIS — G458 Other transient cerebral ischemic attacks and related syndromes: Secondary | ICD-10-CM

## 2014-04-20 NOTE — Progress Notes (Signed)
Patient ID: [name], male   DOB: 28-Mar-1966, 48 y.o.   MRN: [id number]    48 yo patient referred by Dr Dennard Schaumann CRF;s HTN; Had been on ARB and diuretic just added beginning of November History of SSCP  Cath in 2000 by Dr Aldona Bar was normal.  Subsequent ER visist and myvoue normal in 2012.  History of renal cancer and now has persistent micro hematuria being w/u by priamry   Carotid 9/19  1-39% bilateral disease mild plaque Echo 9/19  Normal other than diastolic relaxation abnormality   September ? TIA  Negative w/u with normal MRI He had new onset left-sided facial numbness of cheek and mouth, blurry vision in his left eye, and insignificant weakness of his left leg when walking up stairs. All systems started about noon on day of admission. He has had a "waxy" taste for the last 3 days as well as decreased taste. He is having trouble forming what he wants to say in his head although he is able to get the words out OK. He has never had this issue and he does public speaking.   Has had some palpitations but not frequent enough for event monitor.  Flip flops last seconds.  No continues rapid beats Not related to exertion Has had these For years  Not currenlty worse.     ROS: Denies fever, malais, weight loss, blurry vision, decreased visual acuity, cough, sputum, SOB, hemoptysis, pleuritic pain, palpitaitons, heartburn, abdominal pain, melena, lower extremity edema, claudication, or rash.  All other systems reviewed and negative   General: Affect appropriate Healthy:  appears stated age 48: normal Neck supple with no adenopathy JVP normal no bruits no thyromegaly Lungs clear with no wheezing and good diaphragmatic motion Heart:  S1/S2 no murmur,rub, gallop or click PMI normal Abdomen: benighn, BS positve, no tenderness, no AAA no bruit.  No HSM or HJR Distal pulses intact with no bruits No edema Neuro non-focal Skin warm and dry No muscular weakness  Medications Current  Outpatient Prescriptions  Medication Sig Dispense Refill  . aspirin EC 81 MG tablet Take 1 tablet (81 mg total) by mouth daily. 30 tablet 11  . diphenhydramine-acetaminophen (TYLENOL PM) 25-500 MG TABS Take 2 tablets by mouth at bedtime as needed.    . hydrochlorothiazide (HYDRODIURIL) 25 MG tablet Take 1 tablet (25 mg total) by mouth daily. 30 tablet 5  . losartan (COZAAR) 50 MG tablet Take 1 tablet (50 mg total) by mouth daily. 30 tablet 3  . naproxen sodium (ANAPROX) 220 MG tablet Take 220 mg by mouth 2 (two) times daily with a meal.     No current facility-administered medications for this visit.    Allergies Demerol and Oxycodone  Family History: Family History  Problem Relation Age of Onset  . Hyperlipidemia Mother   . Hypertension Mother   . Heart disease Father   . Hyperlipidemia Father   . Hypertension Father   . Heart disease Sister   . Hyperlipidemia Sister   . Hypertension Sister   . Hyperlipidemia Brother   . Hypertension Brother   . Diabetes Brother   . Cancer Maternal Grandmother   . Hearing loss Maternal Grandmother   . Hypertension Maternal Grandmother   . Cancer Maternal Grandfather   . Cancer Paternal Grandmother   . Heart disease Paternal Grandmother   . Hypertension Paternal Grandmother   . Cancer Paternal Grandfather   . Diabetes Brother   . Heart disease Brother   . Hyperlipidemia Brother   .  Hypertension Brother     Social History: History   Social History  . Marital Status: Single    Spouse Name: N/A    Number of Children: N/A  . Years of Education: N/A   Occupational History  . Not on file.   Social History Main Topics  . Smoking status: Former Smoker    Start date: 07/09/1981    Quit date: 07/10/1999  . Smokeless tobacco: Never Used  . Alcohol Use: No  . Drug Use: No  . Sexual Activity: No   Other Topics Concern  . Not on file   Social History Narrative    Past Surgical History  Procedure Laterality Date  . Knee  arthroscopy w/ meniscal repair      Both knees  . Cosmetic surgery      Dermal tatoo removal face from accident    Past Medical History  Diagnosis Date  . Allergy   . Ulcer   . Left acetabular fracture     nonoperative around age 88  . Diabetes mellitus without complication 53/66/44  . Hyperlipidemia 02/21/14  . Stroke 02/21/14    TIA    Electrocardiogram:  SR rate 75 normal   Assessment and Plan

## 2014-04-20 NOTE — Assessment & Plan Note (Signed)
Cholesterol is at goal.  Continue current dose of statin and diet Rx.  No myalgias or side effects.  F/U  LFT's in 6 months. Lab Results  Component Value Date   LDLCALC 134* 02/20/2014

## 2014-04-20 NOTE — Patient Instructions (Addendum)
Your physician recommends that you schedule a follow-up appointment in: AS NEEDED  WITH  DR [place]  Your physician recommends that you continue on your current medications as directed. Please refer to the Current Medication list given to you today.  NEEDS  LOOP RECORDER DX  TIA

## 2014-04-20 NOTE — Assessment & Plan Note (Signed)
Encouraged him to seek neuro referral to r/o complex migraine or seizure.  Low likelyhood of PAF but primary wants him to have loop recorder Will arrange EP f/u .  We have a research trial that randomizes between office insertion and hospital

## 2014-04-20 NOTE — Assessment & Plan Note (Signed)
Discussed low carb diet.  Target hemoglobin A1c is 6.5 or less.  Continue current medications.  

## 2014-04-24 ENCOUNTER — Encounter: Payer: Self-pay | Admitting: Family Medicine

## 2014-05-07 ENCOUNTER — Encounter: Payer: Self-pay | Admitting: *Deleted

## 2014-05-11 ENCOUNTER — Encounter: Payer: Self-pay | Admitting: Internal Medicine

## 2014-05-11 ENCOUNTER — Ambulatory Visit (INDEPENDENT_AMBULATORY_CARE_PROVIDER_SITE_OTHER): Payer: Commercial Managed Care - PPO | Admitting: Internal Medicine

## 2014-05-11 VITALS — BP 112/70 | HR 76 | Ht 74.0 in | Wt 323.0 lb

## 2014-05-11 DIAGNOSIS — G459 Transient cerebral ischemic attack, unspecified: Secondary | ICD-10-CM

## 2014-05-11 NOTE — Patient Instructions (Signed)
Your physician recommends that you continue on your current medications as directed. Please refer to the Current Medication list given to you today.  No follow up is needed at this time with Dr. Caryl Comes.  He will see you on an as needed basis.

## 2014-05-11 NOTE — Progress Notes (Signed)
ELECTROPHYSIOLOGY CONSULT NOTE  Patient ID: [name], MRN: [id number], DOB/AGE: August 18, 1965 48 y.o. Admit date: (Not on file) Date of Consult: 05/11/2014  Primary Physician: Odette Fraction, MD Primary Cardiologist: PN  Chief Complaint: loop recorder       HPI [name] is a 48 y.o. male  Seen following a TIA September associated with left facial numbness. He underwent an evaluation that was unrevealing. Echocardiogram demonstrated no cardiac source of embolus ejection fraction was normal with some diastolic dysfunction and left atrial size was also normal (39/1.35/24.3)  Prior cardiac evaluation including catheterization 2000 which was normal. He has a history of hypertension  He has had no recurrent events       Past Medical History  Diagnosis Date  . Allergy   . Ulcer   . Left acetabular fracture     nonoperative around age 40  . Diabetes mellitus without complication 15/40/08  . Hyperlipidemia 02/21/14  . Stroke 02/21/14    TIA      Surgical History:  Past Surgical History  Procedure Laterality Date  . Knee arthroscopy w/ meniscal repair      Both knees  . Cosmetic surgery      Dermal tatoo removal face from accident     Home Meds: Prior to Admission medications   Medication Sig Start Date End Date Taking? Authorizing Provider  aspirin EC 81 MG tablet Take 1 tablet (81 mg total) by mouth daily. 02/21/14  Yes Otho Bellows, MD  hydrochlorothiazide (HYDRODIURIL) 25 MG tablet Take 1 tablet (25 mg total) by mouth daily. 04/07/14  Yes Susy Frizzle, MD  losartan (COZAAR) 50 MG tablet Take 1 tablet (50 mg total) by mouth daily. 03/24/14  Yes Susy Frizzle, MD  naproxen sodium (ANAPROX) 220 MG tablet Take 220 mg by mouth as needed (body aches, muscle pain).    Yes Historical Provider, MD       Allergies:  Allergies  Allergen Reactions  . Demerol [Meperidine] Anaphylaxis  . Oxycodone Rash    History   Social History  . Marital Status:  Single    Spouse Name: N/A    Number of Children: N/A  . Years of Education: N/A   Occupational History  . Not on file.   Social History Main Topics  . Smoking status: Former Smoker    Start date: 07/09/1981    Quit date: 07/10/1999  . Smokeless tobacco: Never Used  . Alcohol Use: No  . Drug Use: No  . Sexual Activity: No   Other Topics Concern  . Not on file   Social History Narrative     Family History  Problem Relation Age of Onset  . Hyperlipidemia Mother   . Hypertension Mother   . Heart disease Father   . Hyperlipidemia Father   . Hypertension Father   . Heart disease Sister   . Hyperlipidemia Sister   . Hypertension Sister   . Hyperlipidemia Brother   . Hypertension Brother   . Diabetes Brother   . Cancer Maternal Grandmother   . Hearing loss Maternal Grandmother   . Hypertension Maternal Grandmother   . Cancer Maternal Grandfather   . Cancer Paternal Grandmother   . Heart disease Paternal Grandmother   . Hypertension Paternal Grandmother   . Cancer Paternal Grandfather   . Diabetes Brother   . Heart disease Brother   . Hyperlipidemia Brother   . Hypertension Brother      ROS:  Please see the history of present  illness.     All other systems reviewed and negative.    Physical Exam:   Blood pressure 112/70, pulse 76, height 6\' 2"  (1.88 m), weight 323 lb (146.512 kg). General: Well developed, well nourished male in no acute distress. Head: Normocephalic, atraumatic, sclera non-icteric, no xanthomas, nares are without discharge. EENT: normal Lymph Nodes:  none Back: without scoliosis/kyphosis , no CVA tendersness Neck: Negative for carotid bruits. JVD not elevated. Lungs: Clear bilaterally to auscultation without wheezes, rales, or rhonchi. Breathing is unlabored. Heart: RRR with S1 S2. No   murmur , rubs, or gallops appreciated. Abdomen: Soft, non-tender, non-distended with normoactive bowel sounds. No hepatomegaly. No rebound/guarding. No obvious  abdominal masses. Msk:  Strength and tone appear normal for age. Extremities: No clubbing or cyanosis. No edema.  Distal pedal pulses are 2+ and equal bilaterally. Skin: Warm and Dry Neuro: Alert and oriented X 3. CN III-XII intact Grossly normal sensory and motor function . Psych:  Responds to questions appropriately with a normal affect.      Labs: Cardiac Enzymes No results for input(s): CKTOTAL, CKMB, TROPONINI in the last 72 hours. CBC Lab Results  Component Value Date   WBC 6.7 02/19/2014   HGB 16.0 02/19/2014   HCT 47.0 02/19/2014   MCV 87.5 02/19/2014   PLT 192 02/19/2014   PROTIME: No results for input(s): LABPROT, INR in the last 72 hours. Chemistry No results for input(s): NA, K, CL, CO2, BUN, CREATININE, CALCIUM, PROT, BILITOT, ALKPHOS, ALT, AST, GLUCOSE in the last 168 hours.  Invalid input(s): LABALBU Lipids Lab Results  Component Value Date   CHOL 192 02/20/2014   HDL 48 02/20/2014   LDLCALC 134* 02/20/2014   TRIG 48 02/20/2014   BNP No results found for: PROBNP Miscellaneous No results found for: DDIMER  Radiology/Studies:  No results found.  EKG: sinue 76 20/10/41  Assessment and Plan:   Cryptogenic stroke  Palpitations  We discussed the Crystal-AF data.  We discussed the 30% identification of atrial fibrillation and the anticipated but unproven benefit that would be improved by changing from aspirin to anticoagulant. He is at this time disinclined to pursue monitoring. This is true not withstanding his palpitations which are relatively brief might well represent atrial fibrillation.  He will follow-up with his primary care physician. We will see him as needed.    Virl Axe

## 2014-06-08 ENCOUNTER — Ambulatory Visit (INDEPENDENT_AMBULATORY_CARE_PROVIDER_SITE_OTHER): Payer: Commercial Managed Care - PPO | Admitting: Family Medicine

## 2014-06-08 ENCOUNTER — Encounter: Payer: Self-pay | Admitting: Family Medicine

## 2014-06-08 VITALS — BP 100/62 | HR 82 | Temp 98.3°F | Resp 18 | Ht 74.0 in | Wt 315.0 lb

## 2014-06-08 DIAGNOSIS — G459 Transient cerebral ischemic attack, unspecified: Secondary | ICD-10-CM

## 2014-06-08 LAB — COMPLETE METABOLIC PANEL WITH GFR
ALT: 16 U/L (ref 0–53)
AST: 18 U/L (ref 0–37)
Albumin: 4.2 g/dL (ref 3.5–5.2)
Alkaline Phosphatase: 47 U/L (ref 39–117)
BUN: 16 mg/dL (ref 6–23)
CALCIUM: 9.4 mg/dL (ref 8.4–10.5)
CHLORIDE: 106 meq/L (ref 96–112)
CO2: 23 meq/L (ref 19–32)
Creat: 1.12 mg/dL (ref 0.50–1.35)
GFR, EST AFRICAN AMERICAN: 88 mL/min
GFR, EST NON AFRICAN AMERICAN: 76 mL/min
Glucose, Bld: 104 mg/dL — ABNORMAL HIGH (ref 70–99)
Potassium: 4.5 mEq/L (ref 3.5–5.3)
Sodium: 141 mEq/L (ref 135–145)
Total Bilirubin: 0.4 mg/dL (ref 0.2–1.2)
Total Protein: 7 g/dL (ref 6.0–8.3)

## 2014-06-08 LAB — LIPID PANEL
CHOL/HDL RATIO: 4.2 ratio
Cholesterol: 161 mg/dL (ref 0–200)
HDL: 38 mg/dL — AB (ref >39)
LDL Cholesterol: 112 mg/dL — ABNORMAL HIGH (ref 0–99)
Triglycerides: 55 mg/dL (ref <150)
VLDL: 11 mg/dL (ref 0–40)

## 2014-06-08 LAB — HEMOGLOBIN A1C
HEMOGLOBIN A1C: 5.8 % — AB (ref <5.7)
Mean Plasma Glucose: 120 mg/dL — ABNORMAL HIGH (ref <117)

## 2014-06-08 NOTE — Progress Notes (Signed)
Subjective:    Patient ID: [name], male    DOB: June 04, 1966, 49 y.o.   MRN: [id number]  HPI  Patient is a very pleasant 49 year old white male who has a history of a TIA. He also has a history of prediabetes and hyperlipidemia. His LDL cholesterol is greater than 130 in September. His hemoglobin A1c was 6.3 in September. He is here today to recheck those. He has lost weight since his time in September. The patient discontinued losartan and hydrochlorothiazide possibly one month ago. Since discontinuing his blood pressure medication, his blood pressure has paradoxically improved. He is very consistent in checking his blood pressure twice a day. Virtually every single number he is checked over the last month is exceptional. His blood pressures look much better off the medication. At the present time there is no indication for blood pressure medication. His fasting blood sugar tends to fluctuate between 101 120 with majority less than 112. Patient tried and failed Lipitor due to myalgias. Past Medical History  Diagnosis Date  . Allergy   . Ulcer   . Left acetabular fracture     nonoperative around age 3  . Diabetes mellitus without complication 02/58/52  . Hyperlipidemia 02/21/14  . Stroke 02/21/14    TIA   Past Surgical History  Procedure Laterality Date  . Knee arthroscopy w/ meniscal repair      Both knees  . Cosmetic surgery      Dermal tatoo removal face from accident   Current Outpatient Prescriptions on File Prior to Visit  Medication Sig Dispense Refill  . aspirin EC 81 MG tablet Take 1 tablet (81 mg total) by mouth daily. 30 tablet 11  . naproxen sodium (ANAPROX) 220 MG tablet Take 220 mg by mouth as needed (body aches, muscle pain).     . hydrochlorothiazide (HYDRODIURIL) 25 MG tablet Take 1 tablet (25 mg total) by mouth daily. (Patient not taking: Reported on 06/08/2014) 30 tablet 5  . losartan (COZAAR) 50 MG tablet Take 1 tablet (50 mg total) by mouth daily. (Patient not  taking: Reported on 06/08/2014) 30 tablet 3   No current facility-administered medications on file prior to visit.   Allergies  Allergen Reactions  . Demerol [Meperidine] Anaphylaxis  . Oxycodone Rash   History   Social History  . Marital Status: Single    Spouse Name: N/A    Number of Children: N/A  . Years of Education: N/A   Occupational History  . Not on file.   Social History Main Topics  . Smoking status: Former Smoker    Start date: 07/09/1981    Quit date: 07/10/1999  . Smokeless tobacco: Never Used  . Alcohol Use: No  . Drug Use: No  . Sexual Activity: No   Other Topics Concern  . Not on file   Social History Narrative     Review of Systems  All other systems reviewed and are negative.      Objective:   Physical Exam  Constitutional: He is oriented to person, place, and time. He appears well-developed and well-nourished.  Neck: Neck supple. No JVD present. No thyromegaly present.  Cardiovascular: Normal rate, regular rhythm, normal heart sounds and intact distal pulses.  Exam reveals no gallop and no friction rub.   No murmur heard. Pulmonary/Chest: Effort normal and breath sounds normal. No respiratory distress. He has no wheezes. He has no rales. He exhibits no tenderness.  Lymphadenopathy:    He has no cervical adenopathy.  Neurological: He  is alert and oriented to person, place, and time. He has normal reflexes. He displays normal reflexes. No cranial nerve deficit. He exhibits normal muscle tone. Coordination normal.  Vitals reviewed.         Assessment & Plan:  Transient cerebral ischemia, unspecified transient cerebral ischemia type - Plan: COMPLETE METABOLIC PANEL WITH GFR, Hemoglobin A1c, Lipid panel  Patient's blood pressure is excellent. I will repeat his hemoglobin A1c as well as fasting lipid panel. If his LDL cholesterol is greater than 70, I would recommend pravastatin. At the present time I'm very impressed with the patient's blood  pressure. I cannot in good conscience recommend a blood pressure medication when his blood pressure is doing so well off the medication. I just asked the patient continue to monitor his blood pressure closely. He is very compliant in taking his aspirin for secondary prevention of stroke.

## 2014-06-12 ENCOUNTER — Telehealth: Payer: Self-pay | Admitting: Family Medicine

## 2014-06-12 DIAGNOSIS — Z79899 Other long term (current) drug therapy: Secondary | ICD-10-CM

## 2014-06-12 DIAGNOSIS — E785 Hyperlipidemia, unspecified: Secondary | ICD-10-CM

## 2014-06-12 MED ORDER — PRAVASTATIN SODIUM 40 MG PO TABS: 40.0000 mg | ORAL_TABLET | Freq: Every evening | ORAL | Status: DC

## 2014-06-12 NOTE — Telephone Encounter (Signed)
-----   Message from Susy Frizzle, MD sent at 06/09/2014  7:26 AM EST ----- a1c is much better down to 5.8.  However ldl is still high at 112.  I would recommend a statin.  Pravastatin is mild.  I would try 40 mg poqday and recheck in 3 month.

## 2014-06-12 NOTE — Telephone Encounter (Signed)
Pt aware of lab results and provider recommendations.  Pravastatin to pharmacy.   Repeat labs in 3 months.  3 month labs placed

## 2015-03-04 ENCOUNTER — Ambulatory Visit (INDEPENDENT_AMBULATORY_CARE_PROVIDER_SITE_OTHER): Payer: Commercial Managed Care - PPO

## 2015-03-04 ENCOUNTER — Ambulatory Visit (INDEPENDENT_AMBULATORY_CARE_PROVIDER_SITE_OTHER): Payer: Commercial Managed Care - PPO | Admitting: Family Medicine

## 2015-03-04 VITALS — BP 138/80 | HR 88 | Temp 98.3°F | Resp 18 | Ht 74.0 in | Wt 341.0 lb

## 2015-03-04 DIAGNOSIS — R0602 Shortness of breath: Secondary | ICD-10-CM

## 2015-03-04 DIAGNOSIS — R079 Chest pain, unspecified: Secondary | ICD-10-CM | POA: Diagnosis not present

## 2015-03-04 MED ORDER — ASPIRIN 81 MG PO CHEW
324.0000 mg | CHEWABLE_TABLET | Freq: Once | ORAL | Status: AC
  Administered 2015-03-04: 324 mg via ORAL

## 2015-03-04 NOTE — Progress Notes (Signed)
Subjective:    Patient ID: [name], male    DOB: 1965/06/24, 49 y.o.   MRN: [id number]  HPI Patient presents for chest pain and SOB that have been present for the past 2 days that is getting progressively worse since this morning. States that putting clothes on today and walking from car to check out desk caused sweating, DOE, and chest tightness. Pressure in chest is mostly felt in the center to each mid pectoralis and does not radiate. Additionally sx include non-productive cough, left sided HA, and some nausea. States that he has baseline bilateral lower extremity edema and excess gas. Denies fever, chills, vomiting, dizziness, change in vision. H/o HTN, dyslipidemia, pre-DM, and 2 TIAs in September 2015. No h/o asthma, COPD, or allergies. Has not taken losartan or HTCZ since April 2016 and does not have regular f/u with PCP, but states does not believe BP has been elevated. Only medication taken today was baby aspirin. Does not smoke tobacco currently. Quit in 2001. Does not use illicit drugs or drink alcohol.  Allergies  Allergen Reactions  . Demerol [Meperidine] Anaphylaxis  . Oxycodone Rash   Review of Systems  Constitutional: Positive for diaphoresis. Negative for fever, chills and fatigue.  HENT: Negative for congestion, rhinorrhea, sinus pressure and sore throat.   Respiratory: Positive for cough, chest tightness and shortness of breath. Negative for wheezing.   Cardiovascular: Positive for chest pain and leg swelling (baseline). Negative for palpitations.  Gastrointestinal: Positive for nausea. Negative for vomiting, abdominal pain and diarrhea.  Endocrine: Negative.   Neurological: Positive for headaches. Negative for dizziness, weakness and numbness.       Objective:   Physical Exam  Constitutional: He is oriented to person, place, and time. He appears well-developed and well-nourished. No distress.  Blood pressure 138/80, pulse 88, temperature 98.3 F (36.8 C),  temperature source Oral, resp. rate 18, height 6\' 2"  (1.88 m), weight 341 lb (154.677 kg), SpO2 96 %.   HENT:  Head: Normocephalic and atraumatic.  Right Ear: External ear normal.  Left Ear: External ear normal.  Eyes: Conjunctivae are normal. Pupils are equal, round, and reactive to light. Right eye exhibits no discharge. Left eye exhibits no discharge. No scleral icterus.  Neck: Normal range of motion. Neck supple. No JVD present. Carotid bruit is not present. No thyromegaly present.  Cardiovascular: Normal rate, regular rhythm, normal heart sounds and intact distal pulses.  Exam reveals no gallop and no friction rub.   No murmur heard. Pulmonary/Chest: Effort normal and breath sounds normal. No respiratory distress. He has no wheezes. He has no rales.  Abdominal: Soft. Bowel sounds are normal. He exhibits no distension. There is no tenderness. There is no rebound and no guarding.  Musculoskeletal: Edema: 1+ pitted bilaterally.  Lymphadenopathy:    He has no cervical adenopathy.  Neurological: He is alert and oriented to person, place, and time. No cranial nerve deficit.  Skin: Skin is warm. No rash noted. He is diaphoretic. No erythema. No pallor.  Psychiatric: He has a normal mood and affect. His behavior is normal. Judgment and thought content normal.   [facility] reading (PRIMARY) by  Hilario Quarry. Please comment. IMPRESSION by Dr. Lorriane Shire: No active cardiopulmonary disease.  EKG with Dr. Lorelei Pont: Normal Sinus     Assessment & Plan:  1. SOB (shortness of breath) 2. Chest pain, unspecified chest pain type Patient declines EMS transport against medical advise. Explained risk. Will have friend from work drive him to the [facility]  ER agrees to go straight there.  - EKG 12-Lead - DG Chest 2 View; Future - aspirin chewable tablet 324 mg; Chew 4 tablets (324 mg total) by mouth once.   Alveta Heimlich PA-C  [facility] 03/04/2015 11:11 AM

## 2015-03-04 NOTE — Patient Instructions (Signed)
Should go straight to [facility] [address], [place], [place] [address]

## 2015-06-09 ENCOUNTER — Encounter: Payer: Self-pay | Admitting: Gastroenterology

## 2015-07-23 ENCOUNTER — Ambulatory Visit (AMBULATORY_SURGERY_CENTER): Payer: Self-pay | Admitting: *Deleted

## 2015-07-23 VITALS — Ht 74.0 in | Wt 347.0 lb

## 2015-07-23 DIAGNOSIS — Z1211 Encounter for screening for malignant neoplasm of colon: Secondary | ICD-10-CM

## 2015-07-23 NOTE — Progress Notes (Signed)
Patient denies any allergies to egg or soy products. Patient denies complications with anesthesia/sedation.  Patient denies oxygen use at home and denies diet medications. Emmi instructions for colonoscopy explained but patient denied.     

## 2015-08-06 ENCOUNTER — Ambulatory Visit (AMBULATORY_SURGERY_CENTER): Payer: Commercial Managed Care - PPO | Admitting: Gastroenterology

## 2015-08-06 ENCOUNTER — Encounter: Payer: Self-pay | Admitting: Gastroenterology

## 2015-08-06 VITALS — BP 113/73 | HR 85 | Temp 99.6°F | Resp 14 | Ht 74.0 in | Wt 347.0 lb

## 2015-08-06 DIAGNOSIS — Z1211 Encounter for screening for malignant neoplasm of colon: Secondary | ICD-10-CM | POA: Diagnosis present

## 2015-08-06 DIAGNOSIS — D122 Benign neoplasm of ascending colon: Secondary | ICD-10-CM | POA: Diagnosis not present

## 2015-08-06 MED ORDER — SODIUM CHLORIDE 0.9 % IV SOLN: 500.0000 mL | INTRAVENOUS | Status: DC

## 2015-08-06 NOTE — Op Note (Signed)
[facility]  [address]. [place], [id number]   COLONOSCOPY PROCEDURE REPORT  PATIENT: [name], [name]  MR#: [id number] BIRTHDATE: 05-15-1966 , 50  yrs. old GENDER: male ENDOSCOPIST: Milus Banister, MD REFERRED [id number] Dennard Schaumann, M.D. PROCEDURE DATE:  08/06/2015 PROCEDURE:   Colonoscopy, screening and Colonoscopy with snare polypectomy First Screening Colonoscopy - Avg.  risk and is 50 yrs.  old or older Yes.  Prior Negative Screening - Now for repeat screening. N/A  History of Adenoma - Now for follow-up colonoscopy & has been > or = to 3 yrs.  N/A  Polyps removed today? Yes ASA CLASS:   Class II INDICATIONS:Screening for colonic neoplasia and Colorectal Neoplasm Risk Assessment for this procedure is average risk. MEDICATIONS: Monitored anesthesia care and Propofol 300 mg IV  DESCRIPTION OF PROCEDURE:   After the risks benefits and alternatives of the procedure were thoroughly explained, informed consent was obtained.  The digital rectal exam revealed no abnormalities of the rectum.   The LB [id number]  endoscope was introduced through the anus and advanced to the cecum, which was identified by both the appendix and ileocecal valve. No adverse events experienced.   The quality of the prep was excellent.  The instrument was then slowly withdrawn as the colon was fully examined. Estimated blood loss is zero unless otherwise noted in this procedure report.   COLON FINDINGS: Three sessile polyps ranging between 3-45mm in size were found in the ascending colon.  Polypectomies were performed with a cold snare.  The resection was complete, the polyp tissue was completely retrieved and sent to histology.   The examination was otherwise normal.  Retroflexed views revealed no abnormalities. The time to cecum = 2.1 Withdrawal time = 12.5   The scope was withdrawn and the procedure completed. COMPLICATIONS: There were no immediate complications.  ENDOSCOPIC  IMPRESSION: 1.   Three sessile polyps ranging between 3-49mm in size were found in the ascending colon; polypectomies were performed with a cold snare 2.   The examination was otherwise normal  RECOMMENDATIONS: If the polyp(s) removed today are proven to be adenomatous (pre-cancerous) polyps, you will need a colonoscopy in 3-5 years. Otherwise you should continue to follow colorectal cancer screening guidelines for "routine risk" patients with a colonoscopy in 10 years.  You will receive a letter within 1-2 weeks with the results of your biopsy as well as final recommendations.  Please call my office if you have not received a letter after 3 weeks.  eSigned:  Milus Banister, MD 08/06/2015 8:57 AM

## 2015-08-06 NOTE — Progress Notes (Signed)
Called to room to assist during endoscopic procedure.  Patient ID and intended procedure confirmed with present staff. Received instructions for my participation in the procedure from the performing physician.  

## 2015-08-06 NOTE — Patient Instructions (Signed)
Discharge instructions given. Handout on polyps. Resume previous medications. YOU HAD AN ENDOSCOPIC PROCEDURE TODAY AT THE [facility]:   Refer to the procedure report that was given to you for any specific questions about what was found during the examination.  If the procedure report does not answer your questions, please call your gastroenterologist to clarify.  If you requested that your care partner not be given the details of your procedure findings, then the procedure report has been included in a sealed envelope for you to review at your convenience later.  YOU SHOULD EXPECT: Some feelings of bloating in the abdomen. Passage of more gas than usual.  Walking can help get rid of the air that was put into your GI tract during the procedure and reduce the bloating. If you had a lower endoscopy (such as a colonoscopy or flexible sigmoidoscopy) you may notice spotting of blood in your stool or on the toilet paper. If you underwent a bowel prep for your procedure, you may not have a normal bowel movement for a few days.  Please Note:  You might notice some irritation and congestion in your nose or some drainage.  This is from the oxygen used during your procedure.  There is no need for concern and it should clear up in a day or so.  SYMPTOMS TO REPORT IMMEDIATELY:   Following lower endoscopy (colonoscopy or flexible sigmoidoscopy):  Excessive amounts of blood in the stool  Significant tenderness or worsening of abdominal pains  Swelling of the abdomen that is new, acute  Fever of 100F or higher   For urgent or emergent issues, a gastroenterologist can be reached at any hour by calling [phone].   DIET: Your first meal following the procedure should be a small meal and then it is ok to progress to your normal diet. Heavy or fried foods are harder to digest and may make you feel nauseous or bloated.  Likewise, meals heavy in dairy and vegetables can increase bloating.  Drink  plenty of fluids but you should avoid alcoholic beverages for 24 hours.  ACTIVITY:  You should plan to take it easy for the rest of today and you should NOT DRIVE or use heavy machinery until tomorrow (because of the sedation medicines used during the test).    FOLLOW UP: Our staff will call the number listed on your records the next business day following your procedure to check on you and address any questions or concerns that you may have regarding the information given to you following your procedure. If we do not reach you, we will leave a message.  However, if you are feeling well and you are not experiencing any problems, there is no need to return our call.  We will assume that you have returned to your regular daily activities without incident.  If any biopsies were taken you will be contacted by phone or by letter within the next 1-3 weeks.  Please call us at [phone] if you have not heard about the biopsies in 3 weeks.    SIGNATURES/CONFIDENTIALITY: You and/or your care partner have signed paperwork which will be entered into your electronic medical record.  These signatures attest to the fact that that the information above on your After Visit Summary has been reviewed and is understood.  Full responsibility of the confidentiality of this discharge information lies with you and/or your care-partner. 

## 2015-08-06 NOTE — Progress Notes (Signed)
Report to PACU, RN, vss, BBS= Clear.  

## 2015-08-09 ENCOUNTER — Telehealth: Payer: Self-pay | Admitting: *Deleted

## 2015-08-09 NOTE — Telephone Encounter (Signed)
  Follow up Call-  Call back number 08/06/2015  Post procedure Call Back phone  # [phone]  Permission to leave phone message Yes    Spoke with Ms Zenia Resides, pt was at work Patient questions:  Do you have a fever, pain , or abdominal swelling? No. Pain Score  0 *  Have you tolerated food without any problems? Yes.    Have you been able to return to your normal activities? Yes.    Do you have any questions about your discharge instructions: Diet   No. Medications  No. Follow up visit  No.  Do you have questions or concerns about your Care? No.  Actions: * If pain score is 4 or above: No action needed, pain <4.

## 2015-08-17 ENCOUNTER — Encounter: Payer: Self-pay | Admitting: Gastroenterology

## 2015-11-18 ENCOUNTER — Ambulatory Visit (INDEPENDENT_AMBULATORY_CARE_PROVIDER_SITE_OTHER): Payer: Commercial Managed Care - PPO | Admitting: Physician Assistant

## 2015-11-18 VITALS — BP 136/82 | HR 99 | Temp 99.1°F | Resp 18 | Ht 74.0 in | Wt 344.0 lb

## 2015-11-18 DIAGNOSIS — R109 Unspecified abdominal pain: Secondary | ICD-10-CM

## 2015-11-18 DIAGNOSIS — J029 Acute pharyngitis, unspecified: Secondary | ICD-10-CM

## 2015-11-18 DIAGNOSIS — D72829 Elevated white blood cell count, unspecified: Secondary | ICD-10-CM

## 2015-11-18 LAB — POCT URINALYSIS DIP (MANUAL ENTRY)
Bilirubin, UA: NEGATIVE
Glucose, UA: NEGATIVE
Leukocytes, UA: NEGATIVE
NITRITE UA: NEGATIVE
PROTEIN UA: NEGATIVE
SPEC GRAV UA: 1.02
UROBILINOGEN UA: 1
pH, UA: 6.5

## 2015-11-18 LAB — POCT CBC
GRANULOCYTE PERCENT: 86 % — AB (ref 37–80)
HEMATOCRIT: 42.4 % — AB (ref 43.5–53.7)
HEMOGLOBIN: 14.9 g/dL (ref 14.1–18.1)
Lymph, poc: 1.5 (ref 0.6–3.4)
MCH: 28.8 pg (ref 27–31.2)
MCHC: 35.2 g/dL (ref 31.8–35.4)
MCV: 81.8 fL (ref 80–97)
MID (cbc): 0.2 (ref 0–0.9)
MPV: 7.9 fL (ref 0–99.8)
POC Granulocyte: 10.3 — AB (ref 2–6.9)
POC LYMPH PERCENT: 12.2 %L (ref 10–50)
POC MID %: 1.8 % (ref 0–12)
Platelet Count, POC: 187 10*3/uL (ref 142–424)
RBC: 5.18 M/uL (ref 4.69–6.13)
RDW, POC: 13 %
WBC: 12 10*3/uL — AB (ref 4.6–10.2)

## 2015-11-18 LAB — POCT RAPID STREP A (OFFICE): Rapid Strep A Screen: NEGATIVE

## 2015-11-18 LAB — POC MICROSCOPIC URINALYSIS (UMFC): Mucus: ABSENT

## 2015-11-18 MED ORDER — AMOXICILLIN 875 MG PO TABS: 875.0000 mg | ORAL_TABLET | Freq: Two times a day (BID) | ORAL | Status: AC

## 2015-11-18 NOTE — Progress Notes (Signed)
[facility] [address], [place] [address] [phone]  Date:  11/18/2015   Name:  [name]   DOB:  1965-10-04   MRN:  [id number]  PCP:  Odette Fraction, MD    Chief Complaint: Back Pain; Headache; and Hypertension   History of Present Illness:  This is a 50 y.o. male with PMH TIA, HLD who is presenting with illness.  Complaining of right flank pain for 10 days. Has a history of left flank pain x 27 years. Gets 3-4 episodes a year with assoc hematuria. Was released from the [facility] for this 27 years ago. All testing has apparently come up negative, including scans and biopsy. Biopsy was obtained when first started. He has not seen a urologist or had any further testing in 15 years, states "this is just something I deal with".  For the past 10 days he has had right flank pain with assoc hematuria -- pain has never been on the right before. Hematuria stopped 2 days ago. This is also longer than his usual episode, pain will usually subside after 7 days. Having urinary frequency. Denies dysuria. Is having some right groin pain. No pain into testicles.   Woke this morning and felt bad, hadn't felt bad until this AM. Low grade temp 99.1 today. When he woke this morning he checked his BP which was 160/94, this is abnormal for him. Took some aleve and BP went down to 136/82. Also woke this morning with headache and sore throat. Head hurting bilateral occipital. Having some photosensitivity. Denies blurred vision, phono sensitivity, numbness, weakness. He does have a hx of TIA -- occurred 1.5 years ago, no problems since. He also noted a rash on his left medial knee this am, pruritic. No rash anywhere else. Thinks he was bit by something.  He lives with his best friend, [name]. She has had some postnasal drip, otherwise no sick contacts. He works as the Psychologist, sport and exercise at [employer]. He has not been sexually active in 10 years.  Review of Systems:  Review of  Systems See HPI  Patient Active Problem List   Diagnosis Date Noted  . HLD (hyperlipidemia) 02/21/2014  . Hyperglycemia 02/21/2014  . TIA (transient ischemic attack) 02/19/2014    Prior to Admission medications   Medication Sig Start Date End Date Taking? Authorizing Provider  naproxen sodium (ANAPROX) 220 MG tablet Take 220 mg by mouth as needed (body aches, muscle pain).    Yes Historical Provider, MD   Allergies  Allergen Reactions  . Demerol [Meperidine] Anaphylaxis  . Oxycodone Rash    Past Surgical History  Procedure Laterality Date  . Knee arthroscopy w/ meniscal repair      Both knees  . Cosmetic surgery      Dermal tatoo removal face from accident  . Tonsillectomy    . Wisdom tooth extraction    . Upper gastrointestinal endoscopy      hx ulcer, no longer a problem per patient    Social History  Substance Use Topics  . Smoking status: Former Smoker -- 2.00 packs/day    Types: Cigarettes    Start date: 07/09/1981    Quit date: 07/10/1999  . Smokeless tobacco: Never Used  . Alcohol Use: No    Family History  Problem Relation Age of Onset  . Hyperlipidemia Mother   . Hypertension Mother   . Heart disease Father   . Hyperlipidemia Father   . Hypertension Father   . Heart disease  Sister   . Hyperlipidemia Sister   . Hypertension Sister   . Hyperlipidemia Brother   . Hypertension Brother   . Diabetes Brother   . Colon polyps Brother   . Cancer Maternal Grandmother   . Hearing loss Maternal Grandmother   . Hypertension Maternal Grandmother   . Esophageal cancer Maternal Grandmother   . Cancer Maternal Grandfather   . Colon cancer Maternal Grandfather   . Cancer Paternal Grandmother   . Heart disease Paternal Grandmother   . Hypertension Paternal Grandmother   . Cancer Paternal Grandfather   . Diabetes Brother   . Heart disease Brother   . Hyperlipidemia Brother   . Hypertension Brother   . Colon cancer Maternal Uncle   . Rectal cancer Neg Hx    . Stomach cancer Neg Hx     Medication list has been reviewed and updated.  Physical Examination:  Physical Exam  Constitutional: He is oriented to person, place, and time. He appears well-developed and well-nourished. No distress.  HENT:  Head: Normocephalic and atraumatic.  Right Ear: Hearing, tympanic membrane, external ear and ear canal normal.  Left Ear: Hearing, tympanic membrane, external ear and ear canal normal.  Nose: Nose normal.  Mouth/Throat: Uvula is midline and mucous membranes are normal. Posterior oropharyngeal erythema present. No oropharyngeal exudate or posterior oropharyngeal edema.  Tonsils absent  Eyes: Conjunctivae and lids are normal. Right eye exhibits no discharge. Left eye exhibits no discharge. No scleral icterus.  Neck: Carotid bruit is not present. No Brudzinski's sign noted.  Cardiovascular: Normal rate, regular rhythm, normal heart sounds and normal pulses.   No murmur heard. Pulmonary/Chest: Effort normal and breath sounds normal. No respiratory distress. He has no wheezes. He has no rhonchi. He has no rales.  Abdominal: Soft. Normal appearance. There is no tenderness. There is CVA tenderness (right).  Musculoskeletal: Normal range of motion.  Lymphadenopathy:       Head (right side): Submandibular adenopathy present. No submental and no tonsillar adenopathy present.       Head (left side): Submandibular adenopathy present. No submental and no tonsillar adenopathy present.    He has cervical adenopathy (right anterior).  Neurological: He is alert and oriented to person, place, and time.  Skin: Skin is warm, dry and intact. Rash (left medial knee with about 8 erythematous papules) noted. No lesion noted.  Psychiatric: He has a normal mood and affect. His speech is normal and behavior is normal. Thought content normal.   BP 136/82 mmHg  Pulse 99  Temp(Src) 99.1 F (37.3 C) (Oral)  Resp 18  Ht 6\' 2"  (1.88 m)  Wt 344 lb (156.037 kg)  BMI 44.15  kg/m2  SpO2 95%   Results for orders placed or performed in visit on 11/18/15  POCT CBC  Result Value Ref Range   WBC 12.0 (A) 4.6 - 10.2 K/uL   Lymph, poc 1.5 0.6 - 3.4   POC LYMPH PERCENT 12.2 10 - 50 %L   MID (cbc) 0.2 0 - 0.9   POC MID % 1.8 0 - 12 %M   POC Granulocyte 10.3 (A) 2 - 6.9   Granulocyte percent 86.0 (A) 37 - 80 %G   RBC 5.18 4.69 - 6.13 M/uL   Hemoglobin 14.9 14.1 - 18.1 g/dL   HCT, POC 42.4 (A) 43.5 - 53.7 %   MCV 81.8 80 - 97 fL   MCH, POC 28.8 27 - 31.2 pg   MCHC 35.2 31.8 - 35.4 g/dL   RDW,  POC 13.0 %   Platelet Count, POC 187 142 - 424 K/uL   MPV 7.9 0 - 99.8 fL  POCT Microscopic Urinalysis ([facility])  Result Value Ref Range   WBC,UR,HPF,POC None None WBC/hpf   RBC,UR,HPF,POC Few (A) None RBC/hpf   Bacteria None None, Too numerous to count   Mucus Absent Absent   Epithelial Cells, UR Per Microscopy Few (A) None, Too numerous to count cells/hpf  POCT urinalysis dipstick  Result Value Ref Range   Color, UA yellow yellow   Clarity, UA clear clear   Glucose, UA negative negative   Bilirubin, UA negative negative   Ketones, POC UA trace (5) (A) negative   Spec Grav, UA 1.020    Blood, UA trace-intact (A) negative   pH, UA 6.5    Protein Ur, POC negative negative   Urobilinogen, UA 1.0    Nitrite, UA Negative Negative   Leukocytes, UA Negative Negative  POCT rapid strep A  Result Value Ref Range   Rapid Strep A Screen Negative Negative   Assessment and Plan:  1. Right flank pain 2. Sore throat 3. Leukocytosis  Pt with chronic flank pain but this time different because on the right side and episode is lasting longer than usual. Work up has been negative for his flank pain and hematuria but no testing in the past 15 years. I do think this needs to be worked up further but pt seems wary. UA only with trace blood, otherwise negative. CBC with mild leukocytosis with a left shift. Think sore throat/low grade fever unrelated to flank pain. Rapid strep  negative, but will cover for strep with amoxicillin, which should also cover for urinary pathogens. CMP pending. He will rest and drink plenty of fluids. Return tomorrow to follow up with Philis Fendt, if symptoms not resolving and CBC not improving -- send for abdominal CT. - POCT CBC - POCT Microscopic Urinalysis ([facility]) - POCT urinalysis dipstick - Comprehensive metabolic panel - POCT rapid strep A - Culture, Group A Strep - amoxicillin (AMOXIL) 875 MG tablet; Take 1 tablet (875 mg total) by mouth 2 (two) times daily.  Dispense: 20 tablet; Refill: 0   Benjaman Pott. Drenda Freeze, MHS [facility]  11/18/2015

## 2015-11-18 NOTE — Patient Instructions (Addendum)
Take amoxicillin twice a day for 10 days. Drink plenty of water and get plenty of rest. Tylenol or ibuprofen for pain. Return tomorrow to follow up with [name] --if not getting better, may need to send you for a scan   IF you received an x-ray today, you will receive an invoice from [facility]. Please contact [facility] at [phone] with questions or concerns regarding your invoice.   IF you received labwork today, you will receive an invoice from [employer]. Please contact [facility] at [phone] with questions or concerns regarding your invoice.   Our billing staff will not be able to assist you with questions regarding bills from these companies.  You will be contacted with the lab results as soon as they are available. The fastest way to get your results is to activate your My Chart account. Instructions are located on the last page of this paperwork. If you have not heard from [place] regarding the results in 2 weeks, please contact this office.

## 2015-11-19 ENCOUNTER — Ambulatory Visit (INDEPENDENT_AMBULATORY_CARE_PROVIDER_SITE_OTHER): Payer: Commercial Managed Care - PPO | Admitting: Physician Assistant

## 2015-11-19 ENCOUNTER — Ambulatory Visit (INDEPENDENT_AMBULATORY_CARE_PROVIDER_SITE_OTHER): Payer: Commercial Managed Care - PPO

## 2015-11-19 VITALS — BP 132/80 | HR 93 | Temp 98.6°F | Resp 18 | Ht 74.0 in | Wt 341.0 lb

## 2015-11-19 DIAGNOSIS — D72828 Other elevated white blood cell count: Secondary | ICD-10-CM

## 2015-11-19 DIAGNOSIS — R109 Unspecified abdominal pain: Secondary | ICD-10-CM | POA: Diagnosis not present

## 2015-11-19 DIAGNOSIS — N200 Calculus of kidney: Secondary | ICD-10-CM | POA: Diagnosis not present

## 2015-11-19 LAB — COMPREHENSIVE METABOLIC PANEL
ALT: 17 U/L (ref 9–46)
AST: 18 U/L (ref 10–35)
Albumin: 4.2 g/dL (ref 3.6–5.1)
Alkaline Phosphatase: 53 U/L (ref 40–115)
BILIRUBIN TOTAL: 0.6 mg/dL (ref 0.2–1.2)
BUN: 14 mg/dL (ref 7–25)
CHLORIDE: 105 mmol/L (ref 98–110)
CO2: 19 mmol/L — AB (ref 20–31)
CREATININE: 0.96 mg/dL (ref 0.70–1.33)
Calcium: 9.2 mg/dL (ref 8.6–10.3)
GLUCOSE: 100 mg/dL — AB (ref 65–99)
Potassium: 4.2 mmol/L (ref 3.5–5.3)
SODIUM: 134 mmol/L — AB (ref 135–146)
Total Protein: 7.2 g/dL (ref 6.1–8.1)

## 2015-11-19 LAB — POCT CBC
Granulocyte percent: 84.6 %G — AB (ref 37–80)
HEMATOCRIT: 42.6 % — AB (ref 43.5–53.7)
HEMOGLOBIN: 14.6 g/dL (ref 14.1–18.1)
LYMPH, POC: 1 (ref 0.6–3.4)
MCH, POC: 28.5 pg (ref 27–31.2)
MCHC: 34.3 g/dL (ref 31.8–35.4)
MCV: 83 fL (ref 80–97)
MID (cbc): 0.4 (ref 0–0.9)
MPV: 7.7 fL (ref 0–99.8)
PLATELET COUNT, POC: 167 10*3/uL (ref 142–424)
POC GRANULOCYTE: 7.8 — AB (ref 2–6.9)
POC LYMPH PERCENT: 11.3 %L (ref 10–50)
POC MID %: 4.1 %M (ref 0–12)
RBC: 5.14 M/uL (ref 4.69–6.13)
RDW, POC: 13.6 %
WBC: 9.2 10*3/uL (ref 4.6–10.2)

## 2015-11-19 LAB — POCT URINALYSIS DIP (MANUAL ENTRY)
Bilirubin, UA: NEGATIVE
Glucose, UA: NEGATIVE
LEUKOCYTES UA: NEGATIVE
Nitrite, UA: NEGATIVE
PH UA: 6
Spec Grav, UA: 1.02
UROBILINOGEN UA: 2

## 2015-11-19 MED ORDER — ACETAMINOPHEN-CODEINE #3 300-30 MG PO TABS: 1.0000 | ORAL_TABLET | Freq: Four times a day (QID) | ORAL | Status: DC | PRN

## 2015-11-19 MED ORDER — TAMSULOSIN HCL 0.4 MG PO CAPS: 0.4000 mg | ORAL_CAPSULE | Freq: Every day | ORAL | Status: DC

## 2015-11-19 MED ORDER — KETOROLAC TROMETHAMINE 60 MG/2ML IM SOLN
60.0000 mg | Freq: Once | INTRAMUSCULAR | Status: AC
  Administered 2015-11-19: 60 mg via INTRAMUSCULAR

## 2015-11-19 MED ORDER — NAPROXEN SODIUM 550 MG PO TABS: 550.0000 mg | ORAL_TABLET | Freq: Two times a day (BID) | ORAL | Status: DC

## 2015-11-19 NOTE — Patient Instructions (Addendum)
Please drink copious fluids.  Please take pain medication if the pain is intolerable.  If you get worse tomorrow call and we will order a CT scan.  If you are not better by Monday then call and we will CT scan your abdomen.     IF you received an x-ray today, you will receive an invoice from [facility]. Please contact [facility] at [phone] with questions or concerns regarding your invoice.   IF you received labwork today, you will receive an invoice from [employer]. Please contact [facility] at [phone] with questions or concerns regarding your invoice.   Our billing staff will not be able to assist you with questions regarding bills from these companies.  You will be contacted with the lab results as soon as they are available. The fastest way to get your results is to activate your My Chart account. Instructions are located on the last page of this paperwork. If you have not heard from [place] regarding the results in 2 weeks, please contact this office.

## 2015-11-19 NOTE — Progress Notes (Signed)
11/29/2015 12:00 PM   DOB: [birth year] / MRN: [id number]  SUBJECTIVE:  [name] is a 50 y.o. male presenting for a recheck of flank pain.  He saw Ms. Bush yesterday and was noted for having right flank pain that throat pain. He was subsequently placed on amoxicillin and today reports his throat is feeling better, however the flank pain is worse, and reports it is now radiating to his groin.  He associates mild nausea with the pain.  He denies fever, chills. He has a long history of left sided flank pain and this has has been worked up without any certain etiology. He feels today's pain is different in that it is more severe and stabbing in nature.   He is allergic to demerol and oxycodone.   He  has a past medical history of Ulcer; Left acetabular fracture ([place]); Stroke [facility]) (02/21/14); and Hyperlipidemia (02/21/14).    He  reports that he quit smoking about 16 years ago. His smoking use included Cigarettes. He started smoking about 34 years ago. He smoked 2.00 packs per day. He has never used smokeless tobacco. He reports that he does not drink alcohol or use illicit drugs. He  reports that he does not engage in sexual activity. The patient  has past surgical history that includes Knee arthroscopy w/ meniscal repair; Cosmetic surgery; Tonsillectomy; Wisdom tooth extraction; and Upper gastrointestinal endoscopy.  His family history includes Cancer in his maternal grandfather, maternal grandmother, paternal grandfather, and paternal grandmother; Colon cancer in his maternal grandfather and maternal uncle; Colon polyps in his brother; Diabetes in his brother and brother; Esophageal cancer in his maternal grandmother; Hearing loss in his maternal grandmother; Heart disease in his brother, father, paternal grandmother, and sister; Hyperlipidemia in his brother, brother, father, mother, and sister; Hypertension in his brother, brother, father, maternal grandmother, mother, paternal grandmother, and sister.  There is no history of Rectal cancer or Stomach cancer.  Review of Systems  Constitutional: Negative for fever and chills.  Respiratory: Negative for shortness of breath.   Gastrointestinal: Negative for nausea, vomiting and abdominal pain.  Genitourinary: Negative for dysuria, urgency and frequency.  Musculoskeletal: Positive for myalgias and back pain.  Skin: Negative for rash.  Neurological: Negative for dizziness, tingling, focal weakness and headaches.  Psychiatric/Behavioral: The patient does not have insomnia.     Problem list and medications reviewed and updated by myself where necessary, and exist elsewhere in the encounter.   OBJECTIVE:  BP 132/80 mmHg  Pulse 93  Temp(Src) 98.6 F (37 C) (Oral)  Resp 18  Ht 6\' 2"  (1.88 m)  Wt 341 lb (154.677 kg)  BMI 43.76 kg/m2  SpO2 96%  Physical Exam  Constitutional: He is oriented to person, place, and time.  Cardiovascular: Normal rate and regular rhythm.   Pulmonary/Chest: Effort normal and breath sounds normal.  Abdominal: Soft. Bowel sounds are normal. He exhibits no distension and no mass. There is tenderness (right flank). There is no rebound and no guarding.  Neurological: He is alert and oriented to person, place, and time.    No results found for this or any previous visit (from the past 72 hour(s)). Lab Results  Component Value Date   CREATININE 0.96 11/18/2015     No results found.  ASSESSMENT AND PLAN  [name] was seen today for follow-up.  Diagnoses and all orders for this visit:  Granulocytosis: Improved.  His sore throat is improved.  It seems likely that he does have a bacterial throat infection  and may have become dehydrated, which precipitated the third problem.  With regard to the stone I have advised him to strain his urine, and is he is not improved in 48 hours to call and we will order a CT scan of the abdomen and pelvis.  If he is worse he has been advised to call sooner so we can refer him for this  image.  -     POCT CBC -     POCT urinalysis dipstick  Flank pain -     DG Abd 2 Views; Future -     ketorolac (TORADOL) injection 60 mg; Inject 2 mLs (60 mg total) into the muscle once. -     naproxen sodium (ANAPROX DS) 550 MG tablet; Take 1 tablet (550 mg total) by mouth 2 (two) times daily with a meal. -     acetaminophen-codeine (TYLENOL #3) 300-30 MG tablet; Take 1 tablet by mouth every 6 (six) hours as needed for severe pain.  Nephrolithiasis: See rads. Managed under problem one.   -     tamsulosin (FLOMAX) 0.4 MG CAPS capsule; Take 1 capsule (0.4 mg total) by mouth daily.    The patient was advised to call or return to clinic if he does not see an improvement in symptoms or to seek the care of the closest emergency department if he worsens with the above plan.   Philis Fendt, MHS, PA-C [facility] 11/29/2015 12:00 PM

## 2015-11-20 LAB — CULTURE, GROUP A STREP: Organism ID, Bacteria: NORMAL

## 2015-11-23 ENCOUNTER — Telehealth: Payer: Self-pay

## 2015-11-23 DIAGNOSIS — R10A0 Flank pain, unspecified side: Secondary | ICD-10-CM

## 2015-11-23 DIAGNOSIS — R109 Unspecified abdominal pain: Secondary | ICD-10-CM

## 2015-11-23 NOTE — Telephone Encounter (Signed)
Pt is needing to get [place] to schedule a ct scan for kidney stones he is still having pain and has not passed it yet  Please call him at work at [phone] and you will need to ask for him

## 2015-11-23 NOTE — Telephone Encounter (Signed)
Can we order? [name] is on vacation.

## 2015-11-24 NOTE — Telephone Encounter (Signed)
Patient notified via My Chart.   Orders Placed This Encounter  Procedures  . CT Abdomen Pelvis Wo Contrast    Standing Status: Future     Number of Occurrences:      Standing Expiration Date: 02/23/2017    Order Specific Question:  Reason for Exam (SYMPTOM  OR DIAGNOSIS REQUIRED)    Answer:  flank pain, evaluate for nephrolithiasis    Order Specific Question:  Preferred imaging location?    Answer:  [id number]

## 2015-12-30 ENCOUNTER — Ambulatory Visit (INDEPENDENT_AMBULATORY_CARE_PROVIDER_SITE_OTHER): Payer: Commercial Managed Care - PPO | Admitting: Family Medicine

## 2015-12-30 ENCOUNTER — Encounter: Payer: Self-pay | Admitting: Family Medicine

## 2015-12-30 VITALS — BP 152/94 | HR 92 | Temp 98.6°F | Resp 20 | Ht 74.0 in | Wt 337.0 lb

## 2015-12-30 DIAGNOSIS — I10 Essential (primary) hypertension: Secondary | ICD-10-CM

## 2015-12-30 MED ORDER — AMLODIPINE BESYLATE 10 MG PO TABS: 10.0000 mg | ORAL_TABLET | Freq: Every day | ORAL | 3 refills | Status: DC

## 2015-12-30 NOTE — Progress Notes (Signed)
   Subjective:    Patient ID: [name], male    DOB: [birth year], 50 y.o.   MRN: [id number]  HPI Over the last 2 weeks, the patient has been seen progressively higher blood pressures. His systolic blood pressures have been averaging between 150 and 200. His diastolic blood pressures have been approaching 110. He also reports a dull headache. He has a history of a TIA. He is not taking any aspirin. In the past he tried losartan hydrochlorothiazide with side effects. Past Medical History:  Diagnosis Date  . Hyperlipidemia 02/21/14   diet controlled, no medications  . Left acetabular fracture ([place])    nonoperative around age 9  . Stroke ([place]) 02/21/14   TIA - no limitaions from stroke  . Ulcer    Hx - no longer a problem per patient   Past Surgical History:  Procedure Laterality Date  . COSMETIC SURGERY     Dermal tatoo removal face from accident  . KNEE ARTHROSCOPY W/ MENISCAL REPAIR     Both knees  . TONSILLECTOMY    . UPPER GASTROINTESTINAL ENDOSCOPY     hx ulcer, no longer a problem per patient  . WISDOM TOOTH EXTRACTION     No current outpatient prescriptions on file prior to visit.   No current facility-administered medications on file prior to visit.    Allergies  Allergen Reactions  . Demerol [Meperidine] Anaphylaxis  . Oxycodone Rash   Social History   Social History  . Marital status: Single    Spouse name: N/A  . Number of children: N/A  . Years of education: N/A   Occupational History  . Not on file.   Social History Main Topics  . Smoking status: Former Smoker    Packs/day: 2.00    Types: Cigarettes    Start date: 07/09/1981    Quit date: 07/10/1999  . Smokeless tobacco: Never Used  . Alcohol use No  . Drug use: No  . Sexual activity: No   Other Topics Concern  . Not on file   Social History Narrative  . No narrative on file       Review of Systems  All other systems reviewed and are negative.      Objective:   Physical Exam    Constitutional: He appears well-developed and well-nourished.  Cardiovascular: Normal rate, regular rhythm and normal heart sounds.   No murmur heard. Pulmonary/Chest: Breath sounds normal. No respiratory distress. He has no wheezes. He has no rales. He exhibits no tenderness.  Abdominal: Soft. Bowel sounds are normal.  Musculoskeletal: He exhibits no edema.  Vitals reviewed. 172/110 (left arm)        Assessment & Plan:  Benign essential HTN - Plan: amLODipine (NORVASC) 10 MG tablet Blood pressure certainly elevated. I would like the patient to start amlodipine 10 mg daily and recheck his blood pressure here in one week.

## 2016-01-07 ENCOUNTER — Telehealth: Payer: Self-pay | Admitting: Family Medicine

## 2016-01-07 MED ORDER — BENAZEPRIL HCL 20 MG PO TABS: 20.0000 mg | ORAL_TABLET | Freq: Every day | ORAL | 3 refills | Status: DC

## 2016-01-07 NOTE — Telephone Encounter (Signed)
Pt faxed in BP readings and per Dr. Dennard Schaumann pt needs to be on Benazepril 20mg  along with the Amlodipine and needs a f/u appt in 2 weeks. Patient aware of providers recommendations and appt made.

## 2016-01-20 ENCOUNTER — Encounter: Payer: Self-pay | Admitting: Family Medicine

## 2016-01-20 ENCOUNTER — Ambulatory Visit (INDEPENDENT_AMBULATORY_CARE_PROVIDER_SITE_OTHER): Payer: Commercial Managed Care - PPO | Admitting: Family Medicine

## 2016-01-20 VITALS — BP 136/88 | HR 72 | Temp 98.2°F | Resp 18 | Ht 74.0 in | Wt 341.0 lb

## 2016-01-20 DIAGNOSIS — Z125 Encounter for screening for malignant neoplasm of prostate: Secondary | ICD-10-CM

## 2016-01-20 DIAGNOSIS — E785 Hyperlipidemia, unspecified: Secondary | ICD-10-CM

## 2016-01-20 DIAGNOSIS — R0789 Other chest pain: Secondary | ICD-10-CM | POA: Diagnosis not present

## 2016-01-20 DIAGNOSIS — I10 Essential (primary) hypertension: Secondary | ICD-10-CM

## 2016-01-20 LAB — LIPID PANEL
CHOLESTEROL: 154 mg/dL (ref 125–200)
HDL: 43 mg/dL (ref >=40)
LDL Cholesterol: 95 mg/dL (ref <130)
TRIGLYCERIDES: 79 mg/dL (ref <150)
Total CHOL/HDL Ratio: 3.6 Ratio (ref <=5.0)
VLDL: 16 mg/dL (ref <30)

## 2016-01-20 LAB — COMPLETE METABOLIC PANEL WITH GFR
ALK PHOS: 41 U/L (ref 40–115)
ALT: 21 U/L (ref 9–46)
AST: 23 U/L (ref 10–35)
Albumin: 4.2 g/dL (ref 3.6–5.1)
BILIRUBIN TOTAL: 0.5 mg/dL (ref 0.2–1.2)
BUN: 13 mg/dL (ref 7–25)
CALCIUM: 9.4 mg/dL (ref 8.6–10.3)
CO2: 21 mmol/L (ref 20–31)
CREATININE: 0.82 mg/dL (ref 0.70–1.33)
Chloride: 105 mmol/L (ref 98–110)
Glucose, Bld: 98 mg/dL (ref 70–99)
Potassium: 4.4 mmol/L (ref 3.5–5.3)
Sodium: 139 mmol/L (ref 135–146)
TOTAL PROTEIN: 7.2 g/dL (ref 6.1–8.1)

## 2016-01-20 LAB — PSA: PSA: 0.7 ng/mL (ref <=4.0)

## 2016-01-20 MED ORDER — METOPROLOL SUCCINATE ER 50 MG PO TB24: 50.0000 mg | ORAL_TABLET | Freq: Every day | ORAL | 3 refills | Status: DC

## 2016-01-20 NOTE — Progress Notes (Signed)
Subjective:    Patient ID: [name], male    DOB: 1966-03-22, 50 y.o.   MRN: [id number]  HPI  12/30/15 Over the last 2 weeks, the patient has been seen progressively higher blood pressures. His systolic blood pressures have been averaging between 150 and 200. His diastolic blood pressures have been approaching 110. He also reports a dull headache. He has a history of a TIA. He is not taking any aspirin. In the past he tried losartan hydrochlorothiazide with side effects.  At that time, my plan was: Blood pressure certainly elevated. I would like the patient to start amlodipine 10 mg daily and recheck his blood pressure here in one week.  01/20/16 Blood pressure remained very high and benazepril 20 mg a day was added to amlodipine. His blood pressure has improved very minimally. His blood pressure at home seems to be ranging between 140 and 160/90-100. I checked the blood pressure in his right arm and found to be 150/100. I checked the blood pressure in his left arm and found to be 148/90. However more concerning week, the patient is now endorsing chest pressure with exercise. He reports a squeezing sensation in his chest when he is walking up and down steps. With rest the chest pressure goes away. He also reports more shortness of breath with activity and occasionally pain radiates into his left arm with activity. The symptoms do not occur at rest. All this is concerning for angina.  Past Medical History:  Diagnosis Date  . Hyperlipidemia 02/21/14   diet controlled, no medications  . Left acetabular fracture ([place])    nonoperative around age 27  . Stroke ([place]) 02/21/14   TIA - no limitaions from stroke  . Ulcer    Hx - no longer a problem per patient   Past Surgical History:  Procedure Laterality Date  . COSMETIC SURGERY     Dermal tatoo removal face from accident  . KNEE ARTHROSCOPY W/ MENISCAL REPAIR     Both knees  . TONSILLECTOMY    . UPPER GASTROINTESTINAL ENDOSCOPY     hx ulcer,  no longer a problem per patient  . WISDOM TOOTH EXTRACTION     Current Outpatient Prescriptions on File Prior to Visit  Medication Sig Dispense Refill  . amLODipine (NORVASC) 10 MG tablet Take 1 tablet (10 mg total) by mouth daily. 90 tablet 3  . benazepril (LOTENSIN) 20 MG tablet Take 1 tablet (20 mg total) by mouth daily. 90 tablet 3   No current facility-administered medications on file prior to visit.    Allergies  Allergen Reactions  . Demerol [Meperidine] Anaphylaxis  . Oxycodone Rash   Social History   Social History  . Marital status: Single    Spouse name: N/A  . Number of children: N/A  . Years of education: N/A   Occupational History  . Not on file.   Social History Main Topics  . Smoking status: Former Smoker    Packs/day: 2.00    Types: Cigarettes    Start date: 07/09/1981    Quit date: 07/10/1999  . Smokeless tobacco: Never Used  . Alcohol use No  . Drug use: No  . Sexual activity: No   Other Topics Concern  . Not on file   Social History Narrative  . No narrative on file       Review of Systems  All other systems reviewed and are negative.      Objective:   Physical Exam  Constitutional: He  appears well-developed and well-nourished.  Cardiovascular: Normal rate, regular rhythm and normal heart sounds.   No murmur heard. Pulmonary/Chest: Breath sounds normal. No respiratory distress. He has no wheezes. He has no rales. He exhibits no tenderness.  Abdominal: Soft. Bowel sounds are normal.  Musculoskeletal: He exhibits no edema.  Vitals reviewed.         Assessment & Plan:   Benign essential HTN  HLD (hyperlipidemia) - Plan: COMPLETE METABOLIC PANEL WITH GFR, Lipid panel  Prostate cancer screening - Plan: PSA  Other chest pain - Plan: EKG 12-Lead  Patient's history is very concerning for angina. Fortunately it is stable angina. I have recommended cardiology consultation for stress test and echocardiogram to evaluate for systolic and  diastolic failure. I will perform an EKG today.  I will risk stratify the patient with a CMP and a fasting lipid panel. While checking lab work I will obtain a PSA for screening purposes. Primary concern is to reduce his blood pressure. I will increase benazepril to 40 mg a day and add Toprol-XL 50 mg a day mg a day.  continue aspirin.

## 2016-01-26 ENCOUNTER — Encounter: Payer: Self-pay | Admitting: Family Medicine

## 2016-01-26 MED ORDER — BENAZEPRIL HCL 40 MG PO TABS: 40.0000 mg | ORAL_TABLET | Freq: Every day | ORAL | 3 refills | Status: DC

## 2016-01-26 NOTE — Telephone Encounter (Signed)
Medication called/sent to requested pharmacy and pt aware via mychart 

## 2016-02-04 ENCOUNTER — Other Ambulatory Visit: Payer: Self-pay | Admitting: *Deleted

## 2016-02-04 ENCOUNTER — Encounter: Payer: Self-pay | Admitting: *Deleted

## 2016-02-04 DIAGNOSIS — I10 Essential (primary) hypertension: Secondary | ICD-10-CM

## 2016-02-04 DIAGNOSIS — R0789 Other chest pain: Secondary | ICD-10-CM

## 2016-02-04 MED ORDER — AMLODIPINE BESYLATE 10 MG PO TABS: 10.0000 mg | ORAL_TABLET | Freq: Every day | ORAL | 3 refills | Status: DC

## 2016-02-04 MED ORDER — BENAZEPRIL HCL 40 MG PO TABS: 40.0000 mg | ORAL_TABLET | Freq: Every day | ORAL | 3 refills | Status: DC

## 2016-02-04 MED ORDER — METOPROLOL SUCCINATE ER 50 MG PO TB24: 50.0000 mg | ORAL_TABLET | Freq: Every day | ORAL | 3 refills | Status: DC

## 2016-02-04 NOTE — Telephone Encounter (Signed)
Received fax requesting refill on routine medications.   Prescription sent to pharmacy.

## 2016-03-28 ENCOUNTER — Encounter: Payer: Self-pay | Admitting: Physician Assistant

## 2016-04-11 ENCOUNTER — Ambulatory Visit (INDEPENDENT_AMBULATORY_CARE_PROVIDER_SITE_OTHER): Payer: Commercial Managed Care - PPO | Admitting: Physician Assistant

## 2016-04-11 ENCOUNTER — Encounter: Payer: Self-pay | Admitting: Physician Assistant

## 2016-04-11 VITALS — BP 122/64 | HR 64 | Ht 74.0 in | Wt 343.1 lb

## 2016-04-11 DIAGNOSIS — R0602 Shortness of breath: Secondary | ICD-10-CM

## 2016-04-11 DIAGNOSIS — I10 Essential (primary) hypertension: Secondary | ICD-10-CM

## 2016-04-11 DIAGNOSIS — R072 Precordial pain: Secondary | ICD-10-CM

## 2016-04-11 DIAGNOSIS — R0989 Other specified symptoms and signs involving the circulatory and respiratory systems: Secondary | ICD-10-CM

## 2016-04-11 DIAGNOSIS — E7800 Pure hypercholesterolemia, unspecified: Secondary | ICD-10-CM

## 2016-04-11 MED ORDER — FUROSEMIDE 40 MG PO TABS: 40.0000 mg | ORAL_TABLET | Freq: Every day | ORAL | 6 refills | Status: DC

## 2016-04-11 MED ORDER — AMLODIPINE BESYLATE 5 MG PO TABS: 5.0000 mg | ORAL_TABLET | Freq: Every day | ORAL | 6 refills | Status: DC

## 2016-04-11 MED ORDER — ASPIRIN EC 81 MG PO TBEC: 81.0000 mg | DELAYED_RELEASE_TABLET | Freq: Every day | ORAL | 3 refills | Status: DC

## 2016-04-11 MED ORDER — POTASSIUM CHLORIDE CRYS ER 20 MEQ PO TBCR: 20.0000 meq | EXTENDED_RELEASE_TABLET | Freq: Every day | ORAL | 6 refills | Status: DC

## 2016-04-11 MED ORDER — NITROGLYCERIN 0.4 MG SL SUBL: 0.4000 mg | SUBLINGUAL_TABLET | SUBLINGUAL | 6 refills | Status: DC | PRN

## 2016-04-11 NOTE — Progress Notes (Signed)
Cardiology Office Note:    Date:  04/11/2016   ID:  Vania Rea, DOB [birth year], MRN [id number]  PCP:  Odette Fraction, MD  Cardiologist:  Dr. Jenkins Rouge   Electrophysiologist:  Dr. Virl Axe   Referring MD: Susy Frizzle, MD   Chief Complaint  Patient presents with  . Chest Pain    History of Present Illness:    [name] is a 50 y.o. male with a hx of HTN, renal CA, prior TIA.  LHC in 2000 was reportedly normal.  He was seen by Dr. Jenkins Rouge in 2015 and referred to EP for consideration of ILR.  The patient declined monitoring at that time.  He was seen by his PCP in 8/17 for chest pain.  FU with Cardiology was recommended.  He is here alone.  He has been having exertional chest pain described as sharp and heavy with associated dyspnea on exertion for the last 6 mos. 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  He denies any significant worsening.  He was placed on BP medications in August for the first time.  He notes LE edema over the past 6 mos but it seems to have worsened since starting Amlodipine.  He has noted relief with increased doses of ASA.  He notes assoc nausea and diaphoresis.  He does have radiation of chest pain to his back but denies arm or jaw pain.  He denies orthopnea, PND.  He has had fluctuations in his weight.  Weight is up 3 lbs since 7/17 according to his chart. He denies any assoc with meals.  He denies pleuritic chest pain or cancer with lying supine. He has had a dry cough.  Of note, he had similar symptoms when he was admitted for a cardiac cath in 2000.  He was treated for CHF in the hospital with relief of symptoms and his LHC demonstrated no CAD.    Prior CV studies that were reviewed today include:    Carotid US 9/15 BIlateral: intimal wall thickening CCA. Mild mixed plaque origin ICA. 1-39% ICA stenosis. Vertebral artery flow is antegrade.  Echo 9/15 - Left ventricle: The cavity size  was normal. There was mild   concentric hypertrophy. Systolic function was vigorous. The   estimated ejection fraction was in the range of 65% to 70%. Wall   motion was normal; there were no regional wall motion   abnormalities. Doppler parameters are consistent with abnormal   left ventricular relaxation (grade 1 diastolic dysfunction).   There was no evidence of elevated ventricular filling pressure by   Doppler parameters. - Aortic valve: There was no regurgitation. - Aortic root: The aortic root was normal in size. - Left atrium: The atrium was normal in size. - Right ventricle: Systolic function was normal. - Right atrium: The atrium was normal in size. - Tricuspid valve: There was no regurgitation. - Pulmonary arteries: Systolic pressure was within the normal   range. - Inferior vena cava: The vessel was normal in size. - Pericardium, extracardiac: A trivial pericardial effusion was   identified posterior to the heart. Features were not consistent   with tamponade physiology.  Impressions:  - Normal biventricular size and function. Abnormal relaxation.   Normal filling pressures.  Myoview 3/12 IMPRESSION: 1.  No evidence of exercise-induced myocardial ischemia. 2.  Left ventricular wall motion study is within normal limits, with calculated ejection fraction of 57%.   Past Medical History:  Diagnosis Date  . Hyperlipidemia 02/21/14   diet controlled, no medications  . Left acetabular fracture ([place])    nonoperative around age 46  . Stroke ([place]) 02/21/14   TIA - no limitaions from stroke  . Ulcer ([place])    Hx - no longer a problem per patient    Past Surgical History:  Procedure Laterality Date  . COSMETIC SURGERY     Dermal tatoo removal face from accident  . KNEE ARTHROSCOPY W/ MENISCAL REPAIR     Both knees  . TONSILLECTOMY    . UPPER GASTROINTESTINAL ENDOSCOPY     hx ulcer, no longer a problem per patient  . WISDOM TOOTH EXTRACTION      Current  Medications: Current Meds  Medication Sig  . benazepril (LOTENSIN) 40 MG tablet Take 1 tablet (40 mg total) by mouth daily.  . metoprolol succinate (TOPROL-XL) 50 MG 24 hr tablet Take 1 tablet (50 mg total) by mouth daily. Take with or immediately following a meal.  . [DISCONTINUED] amLODipine (NORVASC) 10 MG tablet Take 1 tablet (10 mg total) by mouth daily.  . [DISCONTINUED] aspirin 325 MG tablet Take 325 mg by mouth 2 (two) times daily.     Allergies:   Demerol [meperidine] and Oxycodone   Social History   Social History  . Marital status: Single    Spouse name: N/A  . Number of children: N/A  . Years of education: N/A   Social History Main Topics  . Smoking status: Former Smoker    Packs/day: 2.00    Types: Cigarettes    Start date: 07/09/1981    Quit date: 07/10/1999  . Smokeless tobacco: Never Used  . Alcohol use No  . Drug use: No  . Sexual activity: No   Other Topics Concern  . None   Social History Narrative  . None     Family History:  The patient's family history includes Cancer in his maternal grandfather, maternal grandmother, paternal grandfather, and paternal grandmother; Colon cancer in his maternal grandfather and maternal uncle; Colon polyps in his brother; Diabetes in his brother and brother; Esophageal cancer in his maternal grandmother; Hearing loss in his maternal grandmother; Heart attack in his father; Heart disease in his brother, father, paternal grandmother, and sister; Hyperlipidemia in his brother, brother, father, mother, and sister; Hypertension in his brother, brother, father, maternal grandmother, mother, paternal grandmother, and sister.   ROS:   Please see the history of present illness.    Review of Systems  Constitution: Positive for malaise/fatigue.       He notes BP discrepancy from R to L arms (lower by 10-20 points in L arm)  Cardiovascular: Positive for chest pain, dyspnea on exertion, irregular heartbeat and leg swelling.    Respiratory: Positive for cough.   Hematologic/Lymphatic: Bruises/bleeds easily.  Musculoskeletal: Positive for back pain.  Gastrointestinal: Positive for constipation, hematochezia and nausea.  Genitourinary: Positive for hematuria.   All other systems reviewed and are negative.   EKGs/Labs/Other Test Reviewed:    EKG:  EKG is  ordered today.  The ekg ordered today demonstrates NSR, HR 64, normal axis, NSSTTW changes, QTc 427 ms  Recent Labs: 11/19/2015: Hemoglobin 14.6 01/20/2016: ALT 21; BUN 13; Creat 0.82; Potassium 4.4; Sodium 139   Recent Lipid Panel    Component Value Date/Time   CHOL 154 01/20/2016 1139   TRIG 79 01/20/2016 1139   HDL 43 01/20/2016 1139   CHOLHDL 3.6 01/20/2016 1139   VLDL 16 01/20/2016 1139  [place] 95 01/20/2016 1139     Physical Exam:    VS:  BP 122/64   Pulse 64   Ht 6\' 2"  (1.88 m)   Wt (!) 343 lb 1.9 oz (155.6 kg)   SpO2 99%   BMI 44.05 kg/m     Wt Readings from Last 3 Encounters:  04/11/16 (!) 343 lb 1.9 oz (155.6 kg)  01/20/16 (!) 341 lb (154.7 kg)  12/30/15 (!) 337 lb (152.9 kg)     Physical Exam  Constitutional: He is oriented to person, place, and time. He appears well-developed and well-nourished. No distress.  HENT:  Head: Normocephalic and atraumatic.  Eyes: No scleral icterus.  Neck: JVD present.  JVP 7-8 cm (exam difficult)  Cardiovascular: Normal rate, regular rhythm and normal heart sounds.  Exam reveals no friction rub.   No murmur heard. + L supraclavicular bruit  Pulmonary/Chest: Effort normal. He has no wheezes. He has no rales.  Abdominal: Soft. He exhibits no mass. There is no tenderness.  Musculoskeletal: He exhibits edema.  2+ LE edema bilaterally  Neurological: He is alert and oriented to person, place, and time.  Skin: Skin is warm and dry.  Psychiatric: He has a normal mood and affect.    ASSESSMENT:    1. Precordial pain   2. Shortness of breath   3. Bruit   4. Essential hypertension   5. Pure  hypercholesterolemia    PLAN:    In order of problems listed above:  1. Chest pain - He has symptoms that are suggestive of angina.  But, he appears volume overloaded and he had similar symptoms prior to his normal heart cath in 2000.  His ECG is normal.  I think he needs diuresis first, then assess for ischemia.  I reviewed with Dr. Cristopher Peru (DOD) who agreed.  -  Change ASA to 81 mg QD  -  Check Echo   -  BMET, BNP, CBC  -  He will need a Myoview arranged at FU if EF normal.  If EF down, will need cath.  -  Will given prn NTG to use   2. Shortness of breath - He has an echo from 2015 with mild diastolic dysfunction and normal EF.  I suspect he has diastolic CHF.  As noted, he responded to IV diuresis in 2000 when he came to the hospital with similar symptoms.  His LHC was normal then.  He appears volume overloaded.  His edema is likely worse with the Amlodipine  -  BMET, BNP, CBC  -  Echo   -  Lasix 40 QD; K 20 QD  -  BMET 1 week  -  Close FU.  If EF normal on Echo, will likely need ETT-Myoview  3. L subclavian bruit - He has a [id number] point systolic differential b/t the R and L arm (L arm lower) at home. I checked his BPs and his L arm is 10 points lower here.  He has a bruit over the L supraclavicular area.    -  Arrange Carotid US and scan subclavians  4. HTN - Edema likely worse with Amlodipine.  We may need to DC it. But, will decrease Amlodipine to 5 mg QD for now.  5. HL - He is intol to statins.  Total time spent with patient today 45 minutes. This includes reviewing records, evaluating the patient and coordinating care. Face-to-face time >50%.   Medication Adjustments/Labs and Tests Ordered: Current medicines are reviewed at length with  the patient today.  Concerns regarding medicines are outlined above.  Medication changes, Labs and Tests ordered today are outlined in the Patient Instructions noted below. Patient Instructions  Medication Instructions:  Your physician has  recommended you make the following change in your medication:  Start furosemide 40 mg by mouth daily. Start potassium 20 meq by mouth daily. Decrease aspirin to 81 mg by mouth daily. Decrease amlodipine to 5 mg by mouth daily.  Use NTG as needed for chest pain.   Labwork: Lab work to be done today-BMP, CBC, BNP Your physician recommends that you return for lab work in: 1 week--BMP  Testing/Procedures: Your physician has requested that you have an echocardiogram. Echocardiography is a painless test that uses sound waves to create images of your heart. It provides your doctor with information about the size and shape of your heart and how well your heart's chambers and valves are working. This procedure takes approximately one hour. There are no restrictions for this procedure.  To be done before follow up visit. Can be done in [place] office if sooner availability.  Your physician has requested that you have a carotid duplex. This test is an ultrasound of the carotid arteries in your neck. It looks at blood flow through these arteries that supply the brain with blood. Allow one hour for this exam. There are no restrictions or special instructions.  Follow-Up: Your physician recommends that you schedule a follow-up appointment in:  1-2 weeks (after echo) with Dr. Johnsie Cancel or Richardson Dopp, PA on day Dr. Johnsie Cancel is in office.   Any Other Special Instructions Will Be Listed Below (If Applicable).  If you need a refill on your cardiac medications before your next appointment, please call your pharmacy.  Signed, Richardson Dopp, PA-C  04/11/2016 5:10 PM    [facility] [address], [place], [place]  [address] Phone: [phone]; Fax: [phone]

## 2016-04-11 NOTE — Patient Instructions (Addendum)
Medication Instructions:  Your physician has recommended you make the following change in your medication:  Start furosemide 40 mg by mouth daily. Start potassium 20 meq by mouth daily. Decrease aspirin to 81 mg by mouth daily. Decrease amlodipine to 5 mg by mouth daily.  Use NTG as needed for chest pain.   Labwork: Lab work to be done today-BMP, CBC, BNP Your physician recommends that you return for lab work in: 1 week--BMP  Testing/Procedures: Your physician has requested that you have an echocardiogram. Echocardiography is a painless test that uses sound waves to create images of your heart. It provides your doctor with information about the size and shape of your heart and how well your heart's chambers and valves are working. This procedure takes approximately one hour. There are no restrictions for this procedure.  To be done before follow up visit. Can be done in [place] office if sooner availability.  Your physician has requested that you have a carotid duplex. This test is an ultrasound of the carotid arteries in your neck. It looks at blood flow through these arteries that supply the brain with blood. Allow one hour for this exam. There are no restrictions or special instructions.  Follow-Up: Your physician recommends that you schedule a follow-up appointment in:  1-2 weeks (after echo) with Dr. Johnsie Cancel or Richardson Dopp, PA on day Dr. Johnsie Cancel is in office.   Any Other Special Instructions Will Be Listed Below (If Applicable).  If you need a refill on your cardiac medications before your next appointment, please call your pharmacy.

## 2016-04-12 ENCOUNTER — Telehealth: Payer: Self-pay | Admitting: *Deleted

## 2016-04-12 ENCOUNTER — Other Ambulatory Visit: Payer: Commercial Managed Care - PPO | Admitting: *Deleted

## 2016-04-12 DIAGNOSIS — R072 Precordial pain: Secondary | ICD-10-CM

## 2016-04-12 DIAGNOSIS — R0602 Shortness of breath: Secondary | ICD-10-CM

## 2016-04-12 LAB — CBC
HEMATOCRIT: 43.6 % (ref 38.5–50.0)
Hemoglobin: 14.4 g/dL (ref 13.2–17.1)
MCH: 28.3 pg (ref 27.0–33.0)
MCHC: 33 g/dL (ref 32.0–36.0)
MCV: 85.7 fL (ref 80.0–100.0)
MPV: 10 fL (ref 7.5–12.5)
Platelets: 263 10*3/uL (ref 140–400)
RBC: 5.09 MIL/uL (ref 4.20–5.80)
RDW: 13.7 % (ref 11.0–15.0)
WBC: 5.6 10*3/uL (ref 3.8–10.8)

## 2016-04-12 LAB — BRAIN NATRIURETIC PEPTIDE: Brain Natriuretic Peptide: 24.9 pg/mL (ref <100)

## 2016-04-12 LAB — BASIC METABOLIC PANEL
BUN: 11 mg/dL (ref 7–25)
CHLORIDE: 105 mmol/L (ref 98–110)
CO2: 21 mmol/L (ref 20–31)
CREATININE: 0.96 mg/dL (ref 0.70–1.33)
Calcium: 9.2 mg/dL (ref 8.6–10.3)
Glucose, Bld: 85 mg/dL (ref 65–99)
POTASSIUM: 4.2 mmol/L (ref 3.5–5.3)
Sodium: 138 mmol/L (ref 135–146)

## 2016-04-12 NOTE — Telephone Encounter (Signed)
Pt notified of lab results. Pt aware BNP may not be accurate per Brynda Rim. PA and that he would like for pt to repeat lab work on Monday 11/13 instead of 11/15. Pt is agreeable to this plan of care.

## 2016-04-13 ENCOUNTER — Ambulatory Visit (HOSPITAL_COMMUNITY)
Admission: RE | Admit: 2016-04-13 | Discharge: 2016-04-13 | Disposition: A | Discharge status: Home / Self Care | Payer: Commercial Managed Care - PPO | Source: Ambulatory Visit | Attending: Cardiology | Admitting: Cardiology

## 2016-04-13 DIAGNOSIS — I6523 Occlusion and stenosis of bilateral carotid arteries: Secondary | ICD-10-CM | POA: Diagnosis not present

## 2016-04-13 DIAGNOSIS — R0989 Other specified symptoms and signs involving the circulatory and respiratory systems: Secondary | ICD-10-CM | POA: Diagnosis present

## 2016-04-14 ENCOUNTER — Encounter: Payer: Self-pay | Admitting: Physician Assistant

## 2016-04-14 ENCOUNTER — Telehealth: Payer: Self-pay | Admitting: *Deleted

## 2016-04-14 NOTE — Telephone Encounter (Signed)
Follow Up:     Pt returning your call. When you call back,he is at [facility] ask for [facility].

## 2016-04-14 NOTE — Telephone Encounter (Signed)
Pt notified of carotid results by phone with verbal understanding. I will send copy of results to PCP.

## 2016-04-14 NOTE — Telephone Encounter (Signed)
Lmtcb to go over carotid results.  

## 2016-04-17 ENCOUNTER — Other Ambulatory Visit: Payer: Commercial Managed Care - PPO | Admitting: *Deleted

## 2016-04-17 ENCOUNTER — Telehealth: Payer: Self-pay | Admitting: *Deleted

## 2016-04-17 DIAGNOSIS — R0602 Shortness of breath: Secondary | ICD-10-CM

## 2016-04-17 DIAGNOSIS — R072 Precordial pain: Secondary | ICD-10-CM

## 2016-04-17 LAB — BASIC METABOLIC PANEL
BUN: 17 mg/dL (ref 7–25)
CALCIUM: 9.2 mg/dL (ref 8.6–10.3)
CO2: 25 mmol/L (ref 20–31)
Chloride: 104 mmol/L (ref 98–110)
Creat: 1.08 mg/dL (ref 0.70–1.33)
GLUCOSE: 105 mg/dL — AB (ref 65–99)
POTASSIUM: 4.6 mmol/L (ref 3.5–5.3)
SODIUM: 137 mmol/L (ref 135–146)

## 2016-04-17 NOTE — Telephone Encounter (Signed)
Pt notified of lab results by phone with verbal understanding.  

## 2016-04-19 ENCOUNTER — Other Ambulatory Visit: Payer: Commercial Managed Care - PPO

## 2016-05-02 ENCOUNTER — Encounter: Payer: Self-pay | Admitting: Physician Assistant

## 2016-05-02 ENCOUNTER — Other Ambulatory Visit: Payer: Self-pay

## 2016-05-02 ENCOUNTER — Telehealth: Payer: Self-pay | Admitting: *Deleted

## 2016-05-02 ENCOUNTER — Ambulatory Visit (HOSPITAL_COMMUNITY): Payer: Commercial Managed Care - PPO | Attending: Internal Medicine

## 2016-05-02 DIAGNOSIS — Z8673 Personal history of transient ischemic attack (TIA), and cerebral infarction without residual deficits: Secondary | ICD-10-CM | POA: Insufficient documentation

## 2016-05-02 DIAGNOSIS — Z87891 Personal history of nicotine dependence: Secondary | ICD-10-CM | POA: Insufficient documentation

## 2016-05-02 DIAGNOSIS — R0602 Shortness of breath: Secondary | ICD-10-CM | POA: Diagnosis not present

## 2016-05-02 DIAGNOSIS — R072 Precordial pain: Secondary | ICD-10-CM | POA: Diagnosis present

## 2016-05-02 DIAGNOSIS — E785 Hyperlipidemia, unspecified: Secondary | ICD-10-CM | POA: Insufficient documentation

## 2016-05-02 NOTE — Telephone Encounter (Signed)
Pt notified of echo results and findings by phone with verbal understanding. Pt denies any snoring. Pt does have an appt with Nicki Reaper W. PA 11/29, advised pt to keep appt. Pt agreeable to plan of care.

## 2016-05-03 ENCOUNTER — Ambulatory Visit (INDEPENDENT_AMBULATORY_CARE_PROVIDER_SITE_OTHER): Payer: Commercial Managed Care - PPO | Admitting: Physician Assistant

## 2016-05-03 ENCOUNTER — Encounter: Payer: Self-pay | Admitting: Physician Assistant

## 2016-05-03 VITALS — BP 128/80 | HR 83 | Ht 74.0 in | Wt 344.0 lb

## 2016-05-03 DIAGNOSIS — I5032 Chronic diastolic (congestive) heart failure: Secondary | ICD-10-CM | POA: Insufficient documentation

## 2016-05-03 DIAGNOSIS — R0989 Other specified symptoms and signs involving the circulatory and respiratory systems: Secondary | ICD-10-CM

## 2016-05-03 DIAGNOSIS — I10 Essential (primary) hypertension: Secondary | ICD-10-CM | POA: Diagnosis not present

## 2016-05-03 DIAGNOSIS — R072 Precordial pain: Secondary | ICD-10-CM | POA: Diagnosis not present

## 2016-05-03 DIAGNOSIS — E7800 Pure hypercholesterolemia, unspecified: Secondary | ICD-10-CM

## 2016-05-03 NOTE — Patient Instructions (Addendum)
Medication Instructions:  INCREASE Lasix to 40 mg Twice daily for 3 days then resume Lasix 40 mg Once daily  INCREASE Potassium to 20 mEq Twice daily for 3 days, then resume Potassium 20 mEq Once daily  If your weight does not return to ~ 332 lbs after 3 days, you can continue the Lasix and Potassium Twice daily for longer. If you notice your weight and breathing worsen after resuming Lasix and Potassium Once daily, you can go back to Twice daily. Call me if you have to continue the Twice daily dosing.  Please try to change your Amlodipine to earlier in the afternoon to see if this will help your evening blood pressures.    Labwork: In 1 week - BMET   Testing/Procedures: None   Follow-Up: Dr. Jenkins Rouge in 2 months.  Any Other Special Instructions Will Be Listed Below (If Applicable). Call if your weight, breathing or chest pain is not responding to the Lasix. If your family notices snoring or that you stop breathing at night, call me so we can arrange a sleep study. Work on diet and weight loss. Limit salt (sodium) to 2000 milligrams per day.   Low-Sodium Eating Plan -Sodium raises blood pressure and causes water to be held in the body. Getting less sodium from food will help lower your blood pressure, reduce any swelling, and protect your heart, liver, and kidneys. We get sodium by adding salt (sodium chloride) to food. Most of our sodium comes from canned, boxed, and frozen foods. Restaurant foods, fast foods, and pizza are also very high in sodium. Even if you take medicine to lower your blood pressure or to reduce fluid in your body, getting less sodium from your food is important. What is my plan? Most people should limit their sodium intake to 2,300 mg a day. Your health care provider recommends that you limit your sodium intake to __________ a day. What do I need to know about this eating plan? For the low-sodium eating plan, you will follow these general guidelines:  Choose  foods with a % Daily Value for sodium of less than 5% (as listed on the food label).  Use salt-free seasonings or herbs instead of table salt or sea salt.  Check with your health care provider or pharmacist before using salt substitutes.  Eat fresh foods.  Eat more vegetables and fruits.  Limit canned vegetables. If you do use them, rinse them well to decrease the sodium.  Limit cheese to 1 oz (28 g) per day.  Eat lower-sodium products, often labeled as "lower sodium" or "no salt added."  Avoid foods that contain monosodium glutamate (MSG). MSG is sometimes added to [place] food and some canned foods.  Check food labels (Nutrition Facts labels) on foods to learn how much sodium is in one serving.  Eat more home-cooked food and less restaurant, buffet, and fast food.  When eating at a restaurant, ask that your food be prepared with less salt, or no salt if possible. How do I read food labels for sodium information? The Nutrition Facts label lists the amount of sodium in one serving of the food. If you eat more than one serving, you must multiply the listed amount of sodium by the number of servings. Food labels may also identify foods as:  Sodium free-Less than 5 mg in a serving.  Very low sodium-35 mg or less in a serving.  Low sodium-140 mg or less in a serving.  Light in sodium-50% less sodium in  a serving. For example, if a food that usually has 300 mg of sodium is changed to become light in sodium, it will have 150 mg of sodium.  Reduced sodium-25% less sodium in a serving. For example, if a food that usually has 400 mg of sodium is changed to reduced sodium, it will have 300 mg of sodium. What foods can I eat? Grains  Low-sodium cereals, including oats, puffed wheat and rice, and shredded wheat cereals. Low-sodium crackers. Unsalted rice and pasta. Lower-sodium bread. Vegetables  Frozen or fresh vegetables. Low-sodium or reduced-sodium canned vegetables. Low-sodium or  reduced-sodium tomato sauce and paste. Low-sodium or reduced-sodium tomato and vegetable juices. Fruits  Fresh, frozen, and canned fruit. Fruit juice. Meat and Other Protein Products  Low-sodium canned tuna and salmon. Fresh or frozen meat, poultry, seafood, and fish. Lamb. Unsalted nuts. Dried beans, peas, and lentils without added salt. Unsalted canned beans. Homemade soups without salt. Eggs. Dairy  Milk. Soy milk. Ricotta cheese. Low-sodium or reduced-sodium cheeses. Yogurt. Condiments  Fresh and dried herbs and spices. Salt-free seasonings. Onion and garlic powders. Low-sodium varieties of mustard and ketchup. Fresh or refrigerated horseradish. Lemon juice. Fats and Oils  Reduced-sodium salad dressings. Unsalted butter. Other  Unsalted popcorn and pretzels. The items listed above may not be a complete list of recommended foods or beverages. Contact your dietitian for more options.  What foods are not recommended? Grains  Instant hot cereals. Bread stuffing, pancake, and biscuit mixes. Croutons. Seasoned rice or pasta mixes. Noodle soup cups. Boxed or frozen macaroni and cheese. Self-rising flour. Regular salted crackers. Vegetables  Regular canned vegetables. Regular canned tomato sauce and paste. Regular tomato and vegetable juices. Frozen vegetables in sauces. Salted [place] fries. Olives. Angie Fava. Relishes. Sauerkraut. Salsa. Meat and Other Protein Products  Salted, canned, smoked, spiced, or pickled meats, seafood, or fish. Bacon, ham, sausage, hot dogs, corned beef, chipped beef, and packaged luncheon meats. Salt pork. Jerky. Pickled herring. Anchovies, regular canned tuna, and sardines. Salted nuts. Dairy  Processed cheese and cheese spreads. Cheese curds. Blue cheese and cottage cheese. Buttermilk. Condiments  Onion and garlic salt, seasoned salt, table salt, and sea salt. Canned and packaged gravies. Worcestershire sauce. Tartar sauce. Barbecue sauce. Teriyaki sauce. Soy sauce,  including reduced sodium. Steak sauce. Fish sauce. Oyster sauce. Cocktail sauce. Horseradish that you find on the shelf. Regular ketchup and mustard. Meat flavorings and tenderizers. Bouillon cubes. Hot sauce. Tabasco sauce. Marinades. Taco seasonings. Relishes. Fats and Oils  Regular salad dressings. Salted butter. Margarine. Ghee. Bacon fat. Other  Potato and tortilla chips. Corn chips and puffs. Salted popcorn and pretzels. Canned or dried soups. Pizza. Frozen entrees and pot pies. The items listed above may not be a complete list of foods and beverages to avoid. Contact your dietitian for more information.  This information is not intended to replace advice given to you by your health care provider. Make sure you discuss any questions you have with your health care provider. Document Released: 11/11/2001 Document Revised: 10/28/2015 Document Reviewed: 03/26/2013 Elsevier Interactive Patient Education  2017 [employer].   If you need a refill on your cardiac medications before your next appointment, please call your pharmacy.

## 2016-05-03 NOTE — Progress Notes (Signed)
Cardiology Office Note:    Date:  05/03/2016   ID:  Vania Rea, DOB 1966-03-28, MRN [id number]  PCP:  Odette Fraction, MD  Cardiologist:  Dr. Jenkins Rouge   Electrophysiologist:  Dr. Virl Axe   Referring MD: Susy Frizzle, MD   Chief Complaint  Patient presents with  . Follow-up    Chest pain, CHF    History of Present Illness:    [name] is a 50 y.o. male with a hx of HTN, renal CA, prior TIA.  LHC in 2000 was reportedly normal.  He was seen by Dr. Jenkins Rouge in 2015 and referred to EP for consideration of ILR.  The patient declined monitoring at that time.  He was seen by his PCP in 8/17 for chest pain.  FU with Cardiology was recommended. I saw him earlier this month.  His symptoms were suggestive of angina. However, he was volume overloaded and had similar symptoms prior to a normal cardiac catheterization in 2000. At that time, he responded to diuresis. I placed him on Lasix and set him up for an echocardiogram.  Echocardiogram was done yesterday and demonstrated normal LV function with mild diastolic dysfunction and moderate right atrial enlargement.  He returns for follow-up.  He is here alone. He felt much better after starting the Lasix. His weight had gone down about 12 pounds (332 pounds) and he had no further chest discomfort. However, last week, he started to notice increasing dyspnea and chest discomfort with activity. He also noted that his blood pressures were increasing in the afternoon ([id number] systolic).  His weight had gone back up to 344.  He does note orthopnea. He denies PND. LE edema is overall improved. He denies syncope. He does have occasional lightheadedness.  Prior CV studies that were reviewed today include:    Echo 11/17:  EF 60-65, normal wall motion, grade 1 diastolic dysfunction, aortic sclerosis without stenosis, trivial MR, normal RVSF, moderate RAE  Carotid US 11/17:  Stable 1-39% bilateral ICA stenosis; Normal subclavian arteries,  bilaterally.  Carotid US 9/15 BIlateral: ICA. 1-39%   Echo 9/15 Mild conc LVH, vigorous LVF, EF 65-70, no RWMA, Gr 1 DD, trivial pericardial eff   Myoview 3/12 IMPRESSION: 1. No evidence of exercise-induced myocardial ischemia. 2. Left ventricular wall motion study is within normal limits, with calculated ejection fraction of 57%.  Past Medical History:  Diagnosis Date  . History of Doppler ultrasound    Carotid US 11/17: Stable 1-39% bilateral ICA stenosis; Normal subclavian arteries, bilaterally.  Marland Kitchen History of echocardiogram    Echo 11/17: EF 60-65, normal wall motion, grade 1 diastolic dysfunction, aortic sclerosis without stenosis, trivial MR, normal RVSF, moderate RAE  . Hyperlipidemia 02/21/14   diet controlled, no medications  . Left acetabular fracture ([place])    nonoperative around age 89  . Stroke ([place]) 02/21/14   TIA - no limitaions from stroke  . Ulcer ([place])    Hx - no longer a problem per patient    Past Surgical History:  Procedure Laterality Date  . COSMETIC SURGERY     Dermal tatoo removal face from accident  . KNEE ARTHROSCOPY W/ MENISCAL REPAIR     Both knees  . TONSILLECTOMY    . UPPER GASTROINTESTINAL ENDOSCOPY     hx ulcer, no longer a problem per patient  . WISDOM TOOTH EXTRACTION      Current Medications: Current Meds  Medication Sig  . amLODipine (NORVASC) 5 MG tablet Take 1 tablet (5 mg  total) by mouth daily.  Marland Kitchen aspirin EC 81 MG tablet Take 1 tablet (81 mg total) by mouth daily.  . benazepril (LOTENSIN) 40 MG tablet Take 1 tablet (40 mg total) by mouth daily.  . furosemide (LASIX) 40 MG tablet Take 1 tablet (40 mg total) by mouth daily.  . metoprolol succinate (TOPROL-XL) 50 MG 24 hr tablet Take 1 tablet (50 mg total) by mouth daily. Take with or immediately following a meal.  . nitroGLYCERIN (NITROSTAT) 0.4 MG SL tablet Place 1 tablet (0.4 mg total) under the tongue every 5 (five) minutes as needed for chest pain.  . potassium chloride SA  (K-DUR,KLOR-CON) 20 MEQ tablet Take 1 tablet (20 mEq total) by mouth daily.     Allergies:   Demerol [meperidine] and Oxycodone   Social History   Social History  . Marital status: Single    Spouse name: N/A  . Number of children: N/A  . Years of education: N/A   Social History Main Topics  . Smoking status: Former Smoker    Packs/day: 2.00    Types: Cigarettes    Start date: 07/09/1981    Quit date: 07/10/1999  . Smokeless tobacco: Never Used  . Alcohol use No  . Drug use: No  . Sexual activity: No   Other Topics Concern  . None   Social History Narrative  . None     Family History:  The patient's family history includes Cancer in his maternal grandfather, maternal grandmother, paternal grandfather, and paternal grandmother; Colon cancer in his maternal grandfather and maternal uncle; Colon polyps in his brother; Diabetes in his brother and brother; Esophageal cancer in his maternal grandmother; Hearing loss in his maternal grandmother; Heart attack in his father; Heart disease in his brother, father, paternal grandmother, and sister; Hyperlipidemia in his brother, brother, father, mother, and sister; Hypertension in his brother, brother, father, maternal grandmother, mother, paternal grandmother, and sister.   ROS:   Please see the history of present illness.    Review of Systems  Constitution: Positive for diaphoresis and malaise/fatigue.  Cardiovascular: Positive for chest pain, dyspnea on exertion, irregular heartbeat and leg swelling.  Respiratory: Positive for cough.   Musculoskeletal: Positive for back pain.  Gastrointestinal: Positive for nausea.  Neurological: Positive for dizziness.   All other systems reviewed and are negative.   EKGs/Labs/Other Test Reviewed:    EKG:  EKG is  ordered today.  The ekg ordered today demonstrates NSR, HR 83, normal axis, QTc 446 ms, no ST changes  Recent Labs: 01/20/2016: ALT 21 04/12/2016: Brain Natriuretic Peptide 24.9;  Hemoglobin 14.4; Platelets 263 04/17/2016: BUN 17; Creat 1.08; Potassium 4.6; Sodium 137   Recent Lipid Panel    Component Value Date/Time   CHOL 154 01/20/2016 1139   TRIG 79 01/20/2016 1139   HDL 43 01/20/2016 1139   CHOLHDL 3.6 01/20/2016 1139   VLDL 16 01/20/2016 1139   LDLCALC 95 01/20/2016 1139     Physical Exam:    VS:  BP 128/80   Pulse 83   Ht 6\' 2"  (1.88 m)   Wt (!) 344 lb (156 kg)   SpO2 98%   BMI 44.17 kg/m     Wt Readings from Last 3 Encounters:  05/03/16 (!) 344 lb (156 kg)  04/11/16 (!) 343 lb 1.9 oz (155.6 kg)  01/20/16 (!) 341 lb (154.7 kg)     Physical Exam  Constitutional: He is oriented to person, place, and time. He appears well-developed and well-nourished. No distress.  Eyes: No scleral icterus.  Neck:  I cannot assess JVD  Cardiovascular: Normal rate, regular rhythm and normal heart sounds.   No murmur heard. Pulmonary/Chest: Effort normal. He has no wheezes. He has no rales.  Abdominal: Soft. There is no tenderness.  Musculoskeletal: He exhibits edema.  Trace-1+ bilateral LE edema  Neurological: He is alert and oriented to person, place, and time.  Skin: Skin is warm and dry.  Psychiatric: He has a normal mood and affect.    ASSESSMENT:    1. Chronic diastolic CHF (congestive heart failure) ([place])   2. Precordial pain   3. Bruit   4. Essential hypertension   5. Pure hypercholesterolemia    PLAN:    In order of problems listed above:  1. Chronic diastolic CHF - He had significant improvement with starting Lasix. However, recently he has developed recurrent volume excess. His blood pressures are also increasing in the afternoons. He currently feels poorly when his blood pressure goes up.    -  Increase Lasix to 40 mg twice a day 3 days  -  Increase potassium to 20 mEq twice a day 3 days  -  BMET 1 week  -  He may continue Lasix and potassium twice a day if needed to maintain normal volume  2. Chest pain - He had resolution of  his chest pain with diuresis.  Now he notes recurrent symptoms with recurrent increase in volume.  His chest pain seems to be mainly related to volume excess.  He had a normal heart cath in 2000.  Myoview in 2012 was low risk.  I have recommended proceeding with Myoview but he prefers to hold off for now.   3. L subclavian bruit -  Carotid US recently with normal subclavian arteries bilaterally.    4. HTN - BP is optimal right now.  However, he has elevations in the afternoon.  Will adjust diuresis as noted.  I asked him to take Amlodipine earlier in the PM to see if this helps as well.    5. HL - He is intol to statins.  Medication Adjustments/Labs and Tests Ordered: Current medicines are reviewed at length with the patient today.  Concerns regarding medicines are outlined above.  Medication changes, Labs and Tests ordered today are outlined in the Patient Instructions noted below. Patient Instructions  Medication Instructions:  INCREASE Lasix to 40 mg Twice daily for 3 days then resume Lasix 40 mg Once daily  INCREASE Potassium to 20 mEq Twice daily for 3 days, then resume Potassium 20 mEq Once daily  If your weight does not return to ~ 332 lbs after 3 days, you can continue the Lasix and Potassium Twice daily for longer. If you notice your weight and breathing worsen after resuming Lasix and Potassium Once daily, you can go back to Twice daily. Call me if you have to continue the Twice daily dosing.  Please try to change your Amlodipine to earlier in the afternoon to see if this will help your evening blood pressures.    Labwork: In 1 week - BMET   Testing/Procedures: None   Follow-Up: Dr. Jenkins Rouge in 2 months.  Any Other Special Instructions Will Be Listed Below (If Applicable). Call if your weight, breathing or chest pain is not responding to the Lasix. If your family notices snoring or that you stop breathing at night, call me so we can arrange a sleep study. Work on diet  and weight loss. Limit salt (sodium) to 2000  milligrams per day.  If you need a refill on your cardiac medications before your next appointment, please call your pharmacy.   Signed, Richardson Dopp, PA-C  05/03/2016 10:00 AM    [facility] [address], [place], [place]  [address] Phone: [phone]; Fax: [phone]

## 2016-05-10 ENCOUNTER — Other Ambulatory Visit: Payer: Self-pay | Admitting: *Deleted

## 2016-05-10 ENCOUNTER — Other Ambulatory Visit (INDEPENDENT_AMBULATORY_CARE_PROVIDER_SITE_OTHER): Payer: Commercial Managed Care - PPO

## 2016-05-10 DIAGNOSIS — I5032 Chronic diastolic (congestive) heart failure: Secondary | ICD-10-CM

## 2016-05-10 NOTE — Progress Notes (Signed)
Pt came in today for his alb work as ordered at last ov appt with [employer]. PA. Pt states he was stuck x 2 and states vein blown on both times and he would like to know does he really need this lab work. Vaughan Basta in [facility] area called me to ask about necessity for lab work. I advised yes pt needs lab work since we increased his las and K+. I advised for pt to come in 12/11 for bmet and that our regular lab tech should be back in the office. Pt is agreeable to this plan of care.

## 2016-05-10 NOTE — Progress Notes (Signed)
[address] [place], [place]   05/10/2016 4:39 PM

## 2016-05-12 ENCOUNTER — Encounter: Payer: Self-pay | Admitting: Physician Assistant

## 2016-05-15 ENCOUNTER — Other Ambulatory Visit: Payer: Commercial Managed Care - PPO | Admitting: *Deleted

## 2016-05-15 DIAGNOSIS — I5032 Chronic diastolic (congestive) heart failure: Secondary | ICD-10-CM

## 2016-05-15 LAB — BASIC METABOLIC PANEL
BUN: 22 mg/dL (ref 7–25)
CO2: 24 mmol/L (ref 20–31)
Calcium: 9.5 mg/dL (ref 8.6–10.3)
Chloride: 104 mmol/L (ref 98–110)
Creat: 1.08 mg/dL (ref 0.70–1.33)
GLUCOSE: 107 mg/dL — AB (ref 65–99)
POTASSIUM: 4.7 mmol/L (ref 3.5–5.3)
SODIUM: 139 mmol/L (ref 135–146)

## 2016-05-16 ENCOUNTER — Telehealth: Payer: Self-pay | Admitting: *Deleted

## 2016-05-16 NOTE — Telephone Encounter (Signed)
DPR ok to s/w pt's friend [place]. [place] has been notified of pt's lab results by phone with verbal understanding . If pt has any questions he can feel free to call back.

## 2016-05-23 ENCOUNTER — Encounter: Payer: Self-pay | Admitting: Physician Assistant

## 2016-05-23 ENCOUNTER — Telehealth: Payer: Self-pay | Admitting: *Deleted

## 2016-05-23 DIAGNOSIS — I5032 Chronic diastolic (congestive) heart failure: Secondary | ICD-10-CM

## 2016-05-23 MED ORDER — FUROSEMIDE 40 MG PO TABS: 40.0000 mg | ORAL_TABLET | Freq: Two times a day (BID) | ORAL | 3 refills | Status: DC

## 2016-05-23 NOTE — Telephone Encounter (Signed)
Pt notified per Richardson Dopp, PA ok to send in new Rx for Lasix 40 mg BID. Pt states he did the lasix increased dose of 3 days and then went back to once a day. He states he tried this a couple of times, though he has not been on the lasix BID since 12/11. Pt advised will need lab work BMET in 1 week. Pt is agreeable to this plan of care . BMET 05/31/16, new Rx for lasix has been sent in.

## 2016-05-26 ENCOUNTER — Encounter: Payer: Self-pay | Admitting: Physician Assistant

## 2016-05-30 ENCOUNTER — Other Ambulatory Visit: Payer: Self-pay | Admitting: *Deleted

## 2016-05-30 DIAGNOSIS — I5032 Chronic diastolic (congestive) heart failure: Secondary | ICD-10-CM

## 2016-05-30 MED ORDER — POTASSIUM CHLORIDE CRYS ER 20 MEQ PO TBCR: 20.0000 meq | EXTENDED_RELEASE_TABLET | Freq: Two times a day (BID) | ORAL | 3 refills | Status: DC

## 2016-05-30 MED ORDER — AMLODIPINE BESYLATE 5 MG PO TABS: 5.0000 mg | ORAL_TABLET | Freq: Every day | ORAL | 3 refills | Status: DC

## 2016-05-30 MED ORDER — FUROSEMIDE 40 MG PO TABS: 40.0000 mg | ORAL_TABLET | Freq: Two times a day (BID) | ORAL | 3 refills | Status: DC

## 2016-05-31 ENCOUNTER — Other Ambulatory Visit: Payer: Commercial Managed Care - PPO | Admitting: *Deleted

## 2016-05-31 DIAGNOSIS — I5032 Chronic diastolic (congestive) heart failure: Secondary | ICD-10-CM

## 2016-05-31 LAB — BASIC METABOLIC PANEL
BUN: 21 mg/dL (ref 7–25)
CALCIUM: 9.8 mg/dL (ref 8.6–10.3)
CHLORIDE: 100 mmol/L (ref 98–110)
CO2: 24 mmol/L (ref 20–31)
Creat: 1.52 mg/dL — ABNORMAL HIGH (ref 0.70–1.33)
Glucose, Bld: 76 mg/dL (ref 65–99)
POTASSIUM: 4.4 mmol/L (ref 3.5–5.3)
SODIUM: 137 mmol/L (ref 135–146)

## 2016-06-01 ENCOUNTER — Telehealth: Payer: Self-pay

## 2016-06-01 DIAGNOSIS — I5032 Chronic diastolic (congestive) heart failure: Secondary | ICD-10-CM

## 2016-06-01 MED ORDER — POTASSIUM CHLORIDE CRYS ER 20 MEQ PO TBCR: 30.0000 meq | EXTENDED_RELEASE_TABLET | Freq: Every day | ORAL | 3 refills | Status: AC

## 2016-06-01 MED ORDER — FUROSEMIDE 40 MG PO TABS: 60.0000 mg | ORAL_TABLET | Freq: Every day | ORAL | 3 refills | Status: DC

## 2016-06-01 NOTE — Telephone Encounter (Signed)
Patient made aware of results. Changed lasix and potassium prescriptions and sent to pharmacy. Scheduled patient for BMET on 06/08/16. Patient verbalizes understanding.

## 2016-06-01 NOTE — Telephone Encounter (Signed)
-----   Message from Liliane Shi, [place] sent at 06/01/2016  7:03 AM EST ----- Please call Vania Rea with the results of his lab work: Kidney function (BUN, Creatinine) is worse - Creatinine increased. All other parameters are within acceptable limits and no further intervention or testing required. Decrease Lasix to 60 mg QD Decrease K+ to 30 mEq QD BMET 1 week. Richardson Dopp, PA-C   06/01/2016 6:58 AM

## 2016-06-08 ENCOUNTER — Other Ambulatory Visit: Payer: Commercial Managed Care - PPO | Admitting: *Deleted

## 2016-06-08 DIAGNOSIS — R109 Unspecified abdominal pain: Secondary | ICD-10-CM

## 2016-06-08 DIAGNOSIS — I5032 Chronic diastolic (congestive) heart failure: Secondary | ICD-10-CM

## 2016-06-08 NOTE — Progress Notes (Signed)
bcd

## 2016-06-08 NOTE — Addendum Note (Signed)
Addended by: Eulis Foster on: 06/08/2016 12:25 PM   Modules accepted: Orders

## 2016-06-08 NOTE — Addendum Note (Signed)
Addended by: Eulis Foster on: 06/08/2016 12:28 PM   Modules accepted: Orders

## 2016-06-08 NOTE — Addendum Note (Signed)
Addended by: Eulis Foster on: 06/08/2016 12:29 PM   Modules accepted: Orders

## 2016-06-09 ENCOUNTER — Telehealth: Payer: Self-pay | Admitting: *Deleted

## 2016-06-09 LAB — CBC WITH DIFFERENTIAL/PLATELET
Basophils Absolute: 0 10*3/uL (ref 0.0–0.2)
Basos: 0 %
EOS (ABSOLUTE): 0.1 10*3/uL (ref 0.0–0.4)
Eos: 1 %
HEMOGLOBIN: 14.9 g/dL (ref 13.0–17.7)
Hematocrit: 44.9 % (ref 37.5–51.0)
Immature Grans (Abs): 0 10*3/uL (ref 0.0–0.1)
Immature Granulocytes: 0 %
LYMPHS ABS: 2.4 10*3/uL (ref 0.7–3.1)
Lymphs: 32 %
MCH: 28.7 pg (ref 26.6–33.0)
MCHC: 33.2 g/dL (ref 31.5–35.7)
MCV: 87 fL (ref 79–97)
MONOS ABS: 0.6 10*3/uL (ref 0.1–0.9)
Monocytes: 8 %
NEUTROS ABS: 4.3 10*3/uL (ref 1.4–7.0)
Neutrophils: 59 %
Platelets: 269 10*3/uL (ref 150–379)
RBC: 5.19 x10E6/uL (ref 4.14–5.80)
RDW: 13.7 % (ref 12.3–15.4)
WBC: 7.3 10*3/uL (ref 3.4–10.8)

## 2016-06-09 LAB — BASIC METABOLIC PANEL
BUN / CREAT RATIO: 16 (ref 9–20)
BUN: 19 mg/dL (ref 6–24)
CHLORIDE: 97 mmol/L (ref 96–106)
CO2: 23 mmol/L (ref 18–29)
Calcium: 10 mg/dL (ref 8.7–10.2)
Creatinine, Ser: 1.16 mg/dL (ref 0.76–1.27)
GFR calc non Af Amer: 72 mL/min/{1.73_m2} (ref >59)
GFR, EST AFRICAN AMERICAN: 83 mL/min/{1.73_m2} (ref >59)
Glucose: 97 mg/dL (ref 65–99)
POTASSIUM: 4.7 mmol/L (ref 3.5–5.2)
SODIUM: 138 mmol/L (ref 134–144)

## 2016-06-09 NOTE — Telephone Encounter (Signed)
Pt notified of lab results by phone with verbal understanding to plan of care.

## 2016-07-10 NOTE — Progress Notes (Signed)
Cardiology Office Note:    Date:  07/14/2016   ID:  [name], DOB Sep 21, 1965, MRN [id number]  PCP:  Odette Fraction, MD  Cardiologist:  Dr. Jenkins Rouge   Electrophysiologist:  Dr. Virl Axe   Referring MD: Susy Frizzle, MD   Chief Complaint  Patient presents with  . Congestive Heart Failure    History of Present Illness:    [name] is a 51 y.o. male with a hx of HTN, renal CA, prior TIA.  I have not seen him since 2015  LHC in 2000 was reportedly normal.  He was seen by Dr. Jenkins Rouge in 2015 and referred to EP for consideration of ILR.  The patient declined monitoring at that time.  He was seen by his PCP in 8/17 for chest pain.  FU with Cardiology was recommended. Seen by PA 05/03/16    His symptoms were suggestive of angina. However, he was volume overloaded and had similar symptoms prior to a normal cardiac catheterization in 2000. At that time, he responded to diuresis. Paced him on Lasix and set him up for an echocardiogram.  Echocardiogram was done 05/02/16 and demonstrated normal LV function with mild diastolic dysfunction and moderate right atrial enlargement.  He returns for follow-up.  I have not seen him in 2 years. He seems upset about his previous care in hospital 2 years ago and not Understanding the difference between diastolic and systolic dysfunction. When I tried to discuss weight Loss with him he got very defensive and said " don't look at me as just a fat person".  He seems to have Some issues with family history of kidney problems and finding his father dead in bed.  Feels exhausted mild chest pain continued dyspnea at work He works at [employer] and finds It harder to push carts and ambulate.   Prior CV studies that were reviewed today include:    Echo 11/17:  EF 60-65 %, normal wall motion, grade 1 diastolic dysfunction, aortic sclerosis without stenosis, trivial MR, normal RVSF, moderate RAE  Carotid US 11/17:  Stable 1-39%  bilateral ICA stenosis; Normal subclavian arteries, bilaterally.  Carotid US 9/15 BIlateral: ICA. 1-39%   Echo 9/15 Mild conc LVH, vigorous LVF, EF 65-70, no RWMA, Gr 1 DD, trivial pericardial eff   Myoview 3/12 IMPRESSION: 1. No evidence of exercise-induced myocardial ischemia. 2. Left ventricular wall motion study is within normal limits, with calculated ejection fraction of 57%.  Past Medical History:  Diagnosis Date  . History of Doppler ultrasound    Carotid US 11/17: Stable 1-39% bilateral ICA stenosis; Normal subclavian arteries, bilaterally.  Marland Kitchen History of echocardiogram    Echo 11/17: EF 60-65, normal wall motion, grade 1 diastolic dysfunction, aortic sclerosis without stenosis, trivial MR, normal RVSF, moderate RAE  . Hyperlipidemia 02/21/14   diet controlled, no medications  . Left acetabular fracture ([place])    nonoperative around age 28  . Stroke ([place]) 02/21/14   TIA - no limitaions from stroke  . Ulcer ([place])    Hx - no longer a problem per patient    Past Surgical History:  Procedure Laterality Date  . COSMETIC SURGERY     Dermal tatoo removal face from accident  . KNEE ARTHROSCOPY W/ MENISCAL REPAIR     Both knees  . TONSILLECTOMY    . UPPER GASTROINTESTINAL ENDOSCOPY     hx ulcer, no longer a problem per patient  . WISDOM TOOTH EXTRACTION  Current Medications: Current Meds  Medication Sig  . amLODipine (NORVASC) 5 MG tablet Take 1 tablet (5 mg total) by mouth daily.  Marland Kitchen aspirin EC 81 MG tablet Take 1 tablet (81 mg total) by mouth daily.  . benazepril (LOTENSIN) 40 MG tablet Take 1 tablet (40 mg total) by mouth daily.  . furosemide (LASIX) 40 MG tablet Take 1.5 tablets (60 mg total) by mouth daily.  . metoprolol succinate (TOPROL-XL) 50 MG 24 hr tablet Take 1 tablet (50 mg total) by mouth daily. Take with or immediately following a meal.  . potassium chloride SA (KLOR-CON M20) 20 MEQ tablet Take 1.5 tablets (30 mEq total) by mouth daily.      Allergies:   Demerol [meperidine] and Oxycodone   Social History   Social History  . Marital status: Single    Spouse name: N/A  . Number of children: N/A  . Years of education: N/A   Social History Main Topics  . Smoking status: Former Smoker    Packs/day: 2.00    Types: Cigarettes    Start date: 07/09/1981    Quit date: 07/10/1999  . Smokeless tobacco: Never Used  . Alcohol use No  . Drug use: No  . Sexual activity: No   Other Topics Concern  . None   Social History Narrative  . None     Family History:  The patient's family history includes Cancer in his maternal grandfather, maternal grandmother, paternal grandfather, and paternal grandmother; Colon cancer in his maternal grandfather and maternal uncle; Colon polyps in his brother; Diabetes in his brother and brother; Esophageal cancer in his maternal grandmother; Hearing loss in his maternal grandmother; Heart attack in his father; Heart disease in his brother, father, paternal grandmother, and sister; Hyperlipidemia in his brother, brother, father, mother, and sister; Hypertension in his brother, brother, father, maternal grandmother, mother, paternal grandmother, and sister.   ROS:   Please see the history of present illness.    Review of Systems  Constitution: Positive for diaphoresis and malaise/fatigue.  Cardiovascular: Positive for chest pain, dyspnea on exertion, irregular heartbeat and leg swelling.  Respiratory: Positive for cough.   Musculoskeletal: Positive for back pain.  Gastrointestinal: Positive for nausea.  Neurological: Positive for dizziness.   All other systems reviewed and are negative.   EKGs/Labs/Other Test Reviewed:    EKG:   NSR, HR 83, normal axis, QTc 446 ms, no ST changes  Recent Labs: 01/20/2016: ALT 21 04/12/2016: Brain Natriuretic Peptide 24.9; Hemoglobin 14.4 06/08/2016: BUN 19; Creatinine, Ser 1.16; Platelets 269; Potassium 4.7; Sodium 138   Recent Lipid Panel    Component Value  Date/Time   CHOL 154 01/20/2016 1139   TRIG 79 01/20/2016 1139   HDL 43 01/20/2016 1139   CHOLHDL 3.6 01/20/2016 1139   VLDL 16 01/20/2016 1139   LDLCALC 95 01/20/2016 1139     Physical Exam:    VS:  BP 102/64   Pulse 70   Ht 6\' 2"  (1.88 m)   Wt (!) 340 lb 6.4 oz (154.4 kg)   SpO2 98%   BMI 43.70 kg/m     Wt Readings from Last 3 Encounters:  07/14/16 (!) 340 lb 6.4 oz (154.4 kg)  05/03/16 (!) 344 lb (156 kg)  04/11/16 (!) 343 lb 1.9 oz (155.6 kg)     Physical Exam  Constitutional: He is oriented to person, place, and time. He appears well-developed and well-nourished. No distress.  Eyes: No scleral icterus.  Neck:  I cannot assess  JVD  Cardiovascular: Normal rate, regular rhythm and normal heart sounds.   No murmur heard. Pulmonary/Chest: Effort normal. He has no wheezes. He has no rales.  Abdominal: Soft. There is no tenderness.  Musculoskeletal: He exhibits edema.  Trace-1+ bilateral LE edema  Neurological: He is alert and oriented to person, place, and time.  Skin: Skin is warm and dry.  Psychiatric: He has a normal mood and affect.    ASSESSMENT:    1. Chronic diastolic CHF (congestive heart failure) (HCC)    PLAN:    In order of problems listed above:  1. Chronic diastolic CHF - Objectively better with clear lungs and no edema but patient still with  Complaints of dyspnea   2. Chest pain - He had resolution of his chest pain with diuresis.  Now he notes recurrent symptoms with recurrent increase in volume.  His chest pain seems to be mainly related to volume excess.  He had a normal heart cath in 2000.  Myoview in 2012 was low risk. Repeat exercise myovue    3. L subclavian bruit -  Carotid US 05/02/16  with normal subclavian arteries bilaterally.    4. HTN - Well controlled.  Continue current medications and low sodium Dash type diet.  Some postural  Symptoms told him to hold his norvasc if this continues   5. HL - He is intol to statins.  6.  Obesity:  He has been as heavy as 400 lbs Does not think weight is contributing to his symptoms Offered more elaborate cardiopulmonary stress testing to see if limitations to exercise are functional Cardiac or pulmonary and he prefers myovue   7. Depression/Anxiety:  Not clear to me that there are not issues with this. He got very emmotional at the end of Our interview and I suspect he should f/u with his primary and explore this some more may benefit from SSRI   Jenkins Rouge

## 2016-07-14 ENCOUNTER — Encounter: Payer: Self-pay | Admitting: Cardiovascular Disease

## 2016-07-14 ENCOUNTER — Ambulatory Visit (INDEPENDENT_AMBULATORY_CARE_PROVIDER_SITE_OTHER): Payer: Commercial Managed Care - PPO | Admitting: Cardiovascular Disease

## 2016-07-14 VITALS — BP 102/64 | HR 70 | Ht 74.0 in | Wt 340.4 lb

## 2016-07-14 DIAGNOSIS — I5032 Chronic diastolic (congestive) heart failure: Secondary | ICD-10-CM | POA: Diagnosis not present

## 2016-07-14 MED ORDER — NITROGLYCERIN 0.4 MG SL SUBL: 0.4000 mg | SUBLINGUAL_TABLET | SUBLINGUAL | 1 refills | Status: DC | PRN

## 2016-07-14 NOTE — Patient Instructions (Addendum)
Medication Instructions:  Your physician recommends that you continue on your current medications as directed. Please refer to the Current Medication list given to you today.  Labwork: NONE  Testing/Procedures: Your physician has requested that you have en exercise stress myoview. For further information please visit [email or web address]. Please follow instruction sheet, as given.  Follow-Up: Your physician wants you to follow-up next available with Dr. Johnsie Cancel.   If you need a refill on your cardiac medications before your next appointment, please call your pharmacy.

## 2016-07-17 ENCOUNTER — Telehealth (HOSPITAL_COMMUNITY): Payer: Self-pay | Admitting: *Deleted

## 2016-07-17 NOTE — Telephone Encounter (Signed)
Patient given detailed instructions per Myocardial Perfusion Study Information Sheet for the test on 07/19/16 at 1230. Patient notified to arrive 15 minutes early and that it is imperative to arrive on time for appointment to keep from having the test rescheduled.  If you need to cancel or reschedule your appointment, please call the office within 24 hours of your appointment. Failure to do so may result in a cancellation of your appointment, and a $50 no show fee. Patient verbalized understanding.Terecia Plaut, [place]

## 2016-07-17 NOTE — Telephone Encounter (Signed)
Left message on voicemail in reference to upcoming appointment scheduled for 07/19/16. Phone number given for a call back so details instructions can be given. Wayne Wicklund, [place]

## 2016-07-19 ENCOUNTER — Ambulatory Visit (HOSPITAL_COMMUNITY): Payer: Commercial Managed Care - PPO | Attending: Cardiology

## 2016-07-19 DIAGNOSIS — I10 Essential (primary) hypertension: Secondary | ICD-10-CM | POA: Insufficient documentation

## 2016-07-19 DIAGNOSIS — Z8249 Family history of ischemic heart disease and other diseases of the circulatory system: Secondary | ICD-10-CM | POA: Diagnosis not present

## 2016-07-19 DIAGNOSIS — I7789 Other specified disorders of arteries and arterioles: Secondary | ICD-10-CM | POA: Diagnosis not present

## 2016-07-19 DIAGNOSIS — E785 Hyperlipidemia, unspecified: Secondary | ICD-10-CM | POA: Diagnosis not present

## 2016-07-19 DIAGNOSIS — I252 Old myocardial infarction: Secondary | ICD-10-CM | POA: Insufficient documentation

## 2016-07-19 DIAGNOSIS — I5032 Chronic diastolic (congestive) heart failure: Secondary | ICD-10-CM | POA: Insufficient documentation

## 2016-07-19 MED ORDER — TECHNETIUM TC 99M TETROFOSMIN IV KIT
32.8000 | PACK | Freq: Once | INTRAVENOUS | Status: AC | PRN
  Administered 2016-07-19: 32.8 via INTRAVENOUS
  Filled 2016-07-19: qty 33

## 2016-07-20 ENCOUNTER — Ambulatory Visit (HOSPITAL_COMMUNITY): Payer: Commercial Managed Care - PPO | Attending: Cardiovascular Disease

## 2016-07-20 LAB — MYOCARDIAL PERFUSION IMAGING
CHL CUP MPHR: 168 {beats}/min
CHL CUP NUCLEAR SDS: 7
CHL CUP NUCLEAR SRS: 3
CHL CUP RESTING HR STRESS: 90 {beats}/min
CSEPEDS: 0 s
Estimated workload: 7 METS
Exercise duration (min): 5 min
LV dias vol: 138 mL (ref 62–150)
LVSYSVOL: 59 mL
NUC STRESS TID: 0.94
Peak HR: 164 {beats}/min
Percent HR: 97 %
RATE: 0.31
SSS: 10

## 2016-07-20 MED ORDER — TECHNETIUM TC 99M TETROFOSMIN IV KIT
32.1000 | PACK | Freq: Once | INTRAVENOUS | Status: AC | PRN
  Administered 2016-07-20: 32.1 via INTRAVENOUS
  Filled 2016-07-20: qty 33

## 2016-07-21 ENCOUNTER — Telehealth: Payer: Self-pay

## 2016-07-21 NOTE — Telephone Encounter (Signed)
Left message with Isaiah [facility]) about having patient call our office and let us know if he wants to schedule an appointment with Dr. Johnsie Cancel or schedule heart cath. Will forward triage if patient calls back.

## 2016-07-21 NOTE — Telephone Encounter (Signed)
Patient asked that we call him at work at [phone]. If we are able to return patient's call in the next 10 minutes (by 12:35pm) he asked that we request they transfer the call/page #[phone]. If it's after 12:35p when we return his call, he asked that we request he be paged at work and he will come to the phone.  Patient will consider following up with a different MD . I've asked him to let the nurse know when he would like for me to call him back regarding next steps in his care.

## 2016-07-21 NOTE — Telephone Encounter (Signed)
-----   Message from Josue Hector, MD sent at 07/20/2016 10:19 PM EST ----- myovue suggests area of infarct/scar he has no known CAD can set up for cath or if he wants to discuss with me can arrange f/u. Cath would be right and left for dyspnea, diastolic CHF and abnormal myovue

## 2016-07-21 NOTE — Telephone Encounter (Signed)
Patient has an appointment with Dr. Johnsie Cancel on 08/03/2016. Patient is okay with keeping this appointment to discuss heart cath. Will make sure this appointment is an appropriate time frame per Dr. Johnsie Cancel or see if patient will need to be seen earlier.

## 2016-07-21 NOTE — Telephone Encounter (Signed)
Ok to see then.  

## 2016-07-21 NOTE — Telephone Encounter (Signed)
Left detailed message on patient's mobile voicemail, per patient request, that it is okay to see Dr. Johnsie Cancel on 08/03/16 to discuss heart cath. Encouraged patient to call if he has any questions or concerns.

## 2016-07-23 ENCOUNTER — Encounter: Payer: Self-pay | Admitting: Cardiovascular Disease

## 2016-07-23 NOTE — Progress Notes (Deleted)
Cardiology Office Note:    Date:  07/23/2016   ID:  [name], DOB [birth year], MRN [id number]  PCP:  Odette Fraction, MD  Cardiologist:  Dr. Jenkins Rouge   Electrophysiologist:  Dr. Virl Axe   Referring MD: Susy Frizzle, MD   No chief complaint on file.   History of Present Illness:    [name] is a 51 y.o. male with a hx of HTN, renal CA, prior TIA.  I have not seen him since 2015  LHC in 2000 was reportedly normal.  He was seen by me in 2015 and referred to EP for consideration of ILR for his TIA to r/o PAF .  The patient declined monitoring at that time.  He was seen by his PCP in 8/17 for chest pain.  FU with Cardiology was recommended. Seen by PA 05/03/16    His symptoms were suggestive of angina. However, he was volume overloaded and had similar symptoms prior to a normal cardiac catheterization in 2000. At that time, he responded to diuresis. Placed him on Lasix and set him up for an echocardiogram.  Echocardiogram was done 05/02/16 and demonstrated normal LV function with mild diastolic dysfunction and moderate right atrial enlargement.    Seen by me 07/14/16 . He seems upset about his previous care in hospital 2 years ago and not Understanding the difference between diastolic and systolic dysfunction. When I tried to discuss weight Loss with him he got very defensive and said " don't look at me as just a fat person".  He seems to have Some issues with family history of kidney problems and finding his father dead in bed.  Feels exhausted mild chest pain continued dyspnea at work He works at [employer] and finds It harder to push carts and ambulate.   Reviewed myovue with him done Despite being "low risk" not normal Exercised 5 minutes Max HR 164 no HTN response to exercise 7 METS ECG not ischemic  To my review there is poor uptake in the entire inferior wall that my be diaphragmatic attenuation Given normal EF but septum also low counts   The left  ventricular ejection fraction is normal (55-65%).  Nuclear stress EF: 58%.  There was no ST segment deviation noted during stress.  Defect 1: There is a small defect of severe severity present in the apical septal location.  Findings consistent with prior myocardial infarction.  This is a low risk study.   Low risk stress nuclear study with a small apicoseptal scar and diaphragmatic attenuation artifact. No reversible ischemia is seen. Normal left ventricular global systolic function.    Prior CV studies that were reviewed today include:    Echo 11/17:  EF 60-65 %, normal wall motion, grade 1 diastolic dysfunction, aortic sclerosis without stenosis, trivial MR, normal RVSF, moderate RAE  Carotid US 11/17:  Stable 1-39% bilateral ICA stenosis; Normal subclavian arteries, bilaterally.  Carotid US 9/15 BIlateral: ICA. 1-39%   Echo 9/15 Mild conc LVH, vigorous LVF, EF 65-70, no RWMA, Gr 1 DD, trivial pericardial eff   Myoview 3/12 IMPRESSION: 1. No evidence of exercise-induced myocardial ischemia. 2. Left ventricular wall motion study is within normal limits, with calculated ejection fraction of 57%.  Past Medical History:  Diagnosis Date  . History of Doppler ultrasound    Carotid US 11/17: Stable 1-39% bilateral ICA stenosis; Normal subclavian arteries, bilaterally.  Marland Kitchen History of echocardiogram    Echo 11/17: EF 60-65, normal wall motion, grade 1 diastolic  dysfunction, aortic sclerosis without stenosis, trivial MR, normal RVSF, moderate RAE  . Hyperlipidemia 02/21/14   diet controlled, no medications  . Left acetabular fracture ([place])    nonoperative around age 39  . Stroke ([place]) 02/21/14   TIA - no limitaions from stroke  . Ulcer ([place])    Hx - no longer a problem per patient    Past Surgical History:  Procedure Laterality Date  . COSMETIC SURGERY     Dermal tatoo removal face from accident  . KNEE ARTHROSCOPY W/ MENISCAL REPAIR     Both knees  .  TONSILLECTOMY    . UPPER GASTROINTESTINAL ENDOSCOPY     hx ulcer, no longer a problem per patient  . WISDOM TOOTH EXTRACTION      Current Medications: No outpatient prescriptions have been marked as taking for the 08/03/16 encounter (Appointment) with Josue Hector, MD.     Allergies:   Demerol [meperidine] and Oxycodone   Social History   Social History  . Marital status: Single    Spouse name: N/A  . Number of children: N/A  . Years of education: N/A   Social History Main Topics  . Smoking status: Former Smoker    Packs/day: 2.00    Types: Cigarettes    Start date: 07/09/1981    Quit date: 07/10/1999  . Smokeless tobacco: Never Used  . Alcohol use No  . Drug use: No  . Sexual activity: No   Other Topics Concern  . Not on file   Social History Narrative  . No narrative on file     Family History:  The patient's family history includes Cancer in his maternal grandfather, maternal grandmother, paternal grandfather, and paternal grandmother; Colon cancer in his maternal grandfather and maternal uncle; Colon polyps in his brother; Diabetes in his brother and brother; Esophageal cancer in his maternal grandmother; Hearing loss in his maternal grandmother; Heart attack in his father; Heart disease in his brother, father, paternal grandmother, and sister; Hyperlipidemia in his brother, brother, father, mother, and sister; Hypertension in his brother, brother, father, maternal grandmother, mother, paternal grandmother, and sister.   ROS:   Please see the history of present illness.    Review of Systems  Constitution: Positive for diaphoresis and malaise/fatigue.  Cardiovascular: Positive for chest pain, dyspnea on exertion, irregular heartbeat and leg swelling.  Respiratory: Positive for cough.   Musculoskeletal: Positive for back pain.  Gastrointestinal: Positive for nausea.  Neurological: Positive for dizziness.   All other systems reviewed and are  negative.   EKGs/Labs/Other Test Reviewed:    EKG:   NSR, HR 83, normal axis, QTc 446 ms, no ST changes  Recent Labs: 01/20/2016: ALT 21 04/12/2016: Brain Natriuretic Peptide 24.9; Hemoglobin 14.4 06/08/2016: BUN 19; Creatinine, Ser 1.16; Platelets 269; Potassium 4.7; Sodium 138   Recent Lipid Panel    Component Value Date/Time   CHOL 154 01/20/2016 1139   TRIG 79 01/20/2016 1139   HDL 43 01/20/2016 1139   CHOLHDL 3.6 01/20/2016 1139   VLDL 16 01/20/2016 1139   LDLCALC 95 01/20/2016 1139     Physical Exam:    VS:  There were no vitals taken for this visit.    Wt Readings from Last 3 Encounters:  07/19/16 (!) 340 lb (154.2 kg)  07/14/16 (!) 340 lb 6.4 oz (154.4 kg)  05/03/16 (!) 344 lb (156 kg)     Physical Exam  Constitutional: He is oriented to person, place, and time. He appears well-developed and well-nourished.  No distress.  Eyes: No scleral icterus.  Neck:  I cannot assess JVD  Cardiovascular: Normal rate, regular rhythm and normal heart sounds.   No murmur heard. Pulmonary/Chest: Effort normal. He has no wheezes. He has no rales.  Abdominal: Soft. There is no tenderness.  Musculoskeletal: He exhibits edema.  Trace-1+ bilateral LE edema  Neurological: He is alert and oriented to person, place, and time.  Skin: Skin is warm and dry.  Psychiatric: He has a normal mood and affect.    ASSESSMENT:    No diagnosis found. PLAN:    In order of problems listed above:  1. Chronic diastolic CHF - Objectively better with clear lungs and no edema but patient still with  Complaints of dyspnea   2. Chest pain - He had resolution of his chest pain with diuresis.  Now he notes recurrent symptoms with recurrent increase in volume.  His chest pain seems to be mainly related to volume excess.  He had a normal heart cath in 2000.  Myoview in 2012 was low risk. Abnormal myovue 07/20/16 See discussion above    3. L subclavian bruit -  Carotid US 05/02/16  with normal subclavian  arteries bilaterally.    4. HTN - Well controlled.  Continue current medications and low sodium Dash type diet.  Some postural  Symptoms told him to hold his norvasc if this continues   5. HL - He is intol to statins.  6. Obesity:  He has been as heavy as 400 lbs Does not think weight is contributing to his symptoms see above Regarding right and left heart cath  7. Depression/Anxiety:  Not clear to me that there are not issues with this. He got very emmotional at the end of Our interview and I suspect he should f/u with his primary and explore this some more may benefit from SSRI   Jenkins Rouge

## 2016-07-24 ENCOUNTER — Telehealth: Payer: Self-pay | Admitting: Cardiovascular Disease

## 2016-07-24 NOTE — Telephone Encounter (Signed)
New message    Pt calling to find out if Pam needed any more information from him. He said he would have his cell phone with him and you could call him on that phone.

## 2016-07-24 NOTE — Telephone Encounter (Signed)
Reminded patient of his up coming appointment, no other concerns at this time.

## 2016-07-27 ENCOUNTER — Encounter: Payer: Self-pay | Admitting: Cardiovascular Disease

## 2016-07-27 ENCOUNTER — Ambulatory Visit (INDEPENDENT_AMBULATORY_CARE_PROVIDER_SITE_OTHER): Payer: Commercial Managed Care - PPO | Admitting: Cardiovascular Disease

## 2016-07-27 VITALS — BP 116/66 | HR 74 | Ht 74.0 in | Wt 334.8 lb

## 2016-07-27 DIAGNOSIS — I10 Essential (primary) hypertension: Secondary | ICD-10-CM

## 2016-07-27 DIAGNOSIS — Z01812 Encounter for preprocedural laboratory examination: Secondary | ICD-10-CM

## 2016-07-27 DIAGNOSIS — I5032 Chronic diastolic (congestive) heart failure: Secondary | ICD-10-CM | POA: Diagnosis not present

## 2016-07-27 LAB — BASIC METABOLIC PANEL
BUN/Creatinine Ratio: 14 (ref 9–20)
BUN: 15 mg/dL (ref 6–24)
CALCIUM: 9.5 mg/dL (ref 8.7–10.2)
CO2: 24 mmol/L (ref 18–29)
Chloride: 100 mmol/L (ref 96–106)
Creatinine, Ser: 1.11 mg/dL (ref 0.76–1.27)
GFR calc Af Amer: 87 (ref >59)
GFR calc non Af Amer: 75 (ref >59)
GLUCOSE: 108 mg/dL — AB (ref 65–99)
POTASSIUM: 4.6 mmol/L (ref 3.5–5.2)
SODIUM: 139 mmol/L (ref 134–144)

## 2016-07-27 LAB — CBC WITH DIFFERENTIAL/PLATELET
BASOS ABS: 0 10*3/uL (ref 0.0–0.2)
Basos: 0 %
EOS (ABSOLUTE): 0.1 10*3/uL (ref 0.0–0.4)
Eos: 1 %
Hematocrit: 42.9 % (ref 37.5–51.0)
Hemoglobin: 14.1 g/dL (ref 13.0–17.7)
IMMATURE GRANULOCYTES: 0 %
Immature Grans (Abs): 0 10*3/uL (ref 0.0–0.1)
LYMPHS: 24 %
Lymphocytes Absolute: 1.8 10*3/uL (ref 0.7–3.1)
MCH: 28.4 pg (ref 26.6–33.0)
MCHC: 32.9 g/dL (ref 31.5–35.7)
MCV: 87 fL (ref 79–97)
MONOS ABS: 0.5 10*3/uL (ref 0.1–0.9)
Monocytes: 7 %
NEUTROS PCT: 68 %
Neutrophils Absolute: 5.2 10*3/uL (ref 1.4–7.0)
PLATELETS: 247 10*3/uL (ref 150–379)
RBC: 4.96 x10E6/uL (ref 4.14–5.80)
RDW: 13.6 % (ref 12.3–15.4)
WBC: 7.6 10*3/uL (ref 3.4–10.8)

## 2016-07-27 LAB — PROTIME-INR
INR: 1 (ref 0.8–1.2)
PROTHROMBIN TIME: 10.5 s (ref 9.1–12.0)

## 2016-07-27 NOTE — Progress Notes (Signed)
Cardiology Office Note:    Date:  07/27/2016   ID:  [name], DOB [birth year], MRN [id number]  PCP:  Odette Fraction, MD  Cardiologist:  Dr. Jenkins Rouge   Electrophysiologist:  Dr. Virl Axe   Referring MD: Susy Frizzle, MD   Chief Complaint  Patient presents with  . Congestive Heart Failure    History of Present Illness:    [name] is a 51 y.o. male with a hx of HTN, renal CA, prior TIA.  I have not seen him since 2015  LHC in 2000 was reportedly normal.  He was seen by Dr. Jenkins Rouge in 2015 and referred to EP for consideration of ILR.  The patient declined monitoring at that time.  He was seen by his PCP in 8/17 for chest pain.  FU with Cardiology was recommended. Seen by PA 05/03/16    His symptoms were suggestive of angina. However, he was volume overloaded and had similar symptoms prior to a normal cardiac catheterization in 2000. At that time, he responded to diuresis. Paced him on Lasix and set him up for an echocardiogram.  Echocardiogram was done 05/02/16 and demonstrated normal LV function with mild diastolic dysfunction and moderate right atrial enlargement.  He returns for follow-up.  I saw him 2 years ago  He seems upset about his previous care in hospital 2 years ago and not Understanding the difference between diastolic and systolic dysfunction. When I tried to discuss weight Loss with him he got very defensive and said " don't look at me as just a fat person".  He seems to have Some issues with family history of kidney problems and finding his father dead in bed.  Feels exhausted mild chest pain continued dyspnea at work He works at [employer] and finds It harder to push carts and ambulate.   Myovue  Low risk but not normal   The left ventricular ejection fraction is normal (55-65%).  Nuclear stress EF: 58%.  There was no ST segment deviation noted during stress.  Defect 1: There is a small defect of severe severity present in the  apical septal location.  Findings consistent with prior myocardial infarction.  This is a low risk study.    Long discussion with patient regarding abnormalities and options Favor right and left cath risks including Stroke , bleeding, MI and need for surgery discussed Wants to proceed ASAP.  Had SSCP after stress Test and took 3 nitro  Low risk stress nuclear study with a small apicoseptal scar and diaphragmatic attenuation artifact. No reversible ischemia is seen. Normal left ventricular global systolic function.  Prior CV studies that were reviewed today include:    Echo 11/17:  EF 60-65 %, normal wall motion, grade 1 diastolic dysfunction, aortic sclerosis without stenosis, trivial MR, normal RVSF, moderate RAE  Carotid US 11/17:  Stable 1-39% bilateral ICA stenosis; Normal subclavian arteries, bilaterally.  Carotid US 9/15 BIlateral: ICA. 1-39%   Echo 9/15 Mild conc LVH, vigorous LVF, EF 65-70, no RWMA, Gr 1 DD, trivial pericardial eff   Myoview 3/12 IMPRESSION: 1. No evidence of exercise-induced myocardial ischemia. 2. Left ventricular wall motion study is within normal limits, with calculated ejection fraction of 57%.  Past Medical History:  Diagnosis Date  . History of Doppler ultrasound    Carotid US 11/17: Stable 1-39% bilateral ICA stenosis; Normal subclavian arteries, bilaterally.  Marland Kitchen History of echocardiogram    Echo 11/17: EF 60-65, normal wall motion, grade 1 diastolic dysfunction,  aortic sclerosis without stenosis, trivial MR, normal RVSF, moderate RAE  . Hyperlipidemia 02/21/14   diet controlled, no medications  . Left acetabular fracture ([place])    nonoperative around age 36  . Stroke ([place]) 02/21/14   TIA - no limitaions from stroke  . Ulcer ([place])    Hx - no longer a problem per patient    Past Surgical History:  Procedure Laterality Date  . COSMETIC SURGERY     Dermal tatoo removal face from accident  . KNEE ARTHROSCOPY W/ MENISCAL REPAIR      Both knees  . TONSILLECTOMY    . UPPER GASTROINTESTINAL ENDOSCOPY     hx ulcer, no longer a problem per patient  . WISDOM TOOTH EXTRACTION      Current Medications: Current Meds  Medication Sig  . amLODipine (NORVASC) 5 MG tablet Take 1 tablet (5 mg total) by mouth daily.  Marland Kitchen aspirin EC 81 MG tablet Take 1 tablet (81 mg total) by mouth daily.  . benazepril (LOTENSIN) 40 MG tablet Take 1 tablet (40 mg total) by mouth daily.  . furosemide (LASIX) 40 MG tablet Take 1.5 tablets (60 mg total) by mouth daily.  . metoprolol succinate (TOPROL-XL) 50 MG 24 hr tablet Take 1 tablet (50 mg total) by mouth daily. Take with or immediately following a meal.  . nitroGLYCERIN (NITROSTAT) 0.4 MG SL tablet Place 1 tablet (0.4 mg total) under the tongue every 5 (five) minutes as needed for chest pain.  . potassium chloride SA (KLOR-CON M20) 20 MEQ tablet Take 1.5 tablets (30 mEq total) by mouth daily.     Allergies:   Demerol [meperidine] and Oxycodone   Social History   Social History  . Marital status: Single    Spouse name: N/A  . Number of children: N/A  . Years of education: N/A   Social History Main Topics  . Smoking status: Former Smoker    Packs/day: 2.00    Types: Cigarettes    Start date: 07/09/1981    Quit date: 07/10/1999  . Smokeless tobacco: Never Used  . Alcohol use No  . Drug use: No  . Sexual activity: No   Other Topics Concern  . None   Social History Narrative  . None     Family History:  The patient's family history includes Cancer in his maternal grandfather, maternal grandmother, paternal grandfather, and paternal grandmother; Colon cancer in his maternal grandfather and maternal uncle; Colon polyps in his brother; Diabetes in his brother and brother; Esophageal cancer in his maternal grandmother; Hearing loss in his maternal grandmother; Heart attack in his father; Heart disease in his brother, father, paternal grandmother, and sister; Hyperlipidemia in his brother,  brother, father, mother, and sister; Hypertension in his brother, brother, father, maternal grandmother, mother, paternal grandmother, and sister.   ROS:   Please see the history of present illness.    Review of Systems  Constitution: Positive for diaphoresis and malaise/fatigue.  Cardiovascular: Positive for chest pain, dyspnea on exertion, irregular heartbeat and leg swelling.  Respiratory: Positive for cough.   Musculoskeletal: Positive for back pain.  Gastrointestinal: Positive for nausea.  Neurological: Positive for dizziness.   All other systems reviewed and are negative.   EKGs/Labs/Other Test Reviewed:    EKG:   NSR, HR 83, normal axis, QTc 446 ms, no ST changes  Recent Labs: 01/20/2016: ALT 21 04/12/2016: Brain Natriuretic Peptide 24.9; Hemoglobin 14.4 06/08/2016: BUN 19; Creatinine, Ser 1.16; Platelets 269; Potassium 4.7; Sodium 138   Recent  Lipid Panel    Component Value Date/Time   CHOL 154 01/20/2016 1139   TRIG 79 01/20/2016 1139   HDL 43 01/20/2016 1139   CHOLHDL 3.6 01/20/2016 1139   VLDL 16 01/20/2016 1139   LDLCALC 95 01/20/2016 1139     Physical Exam:    VS:  BP 116/66   Pulse 74   Ht 6\' 2"  (1.88 m)   Wt (!) 334 lb 12.8 oz (151.9 kg)   SpO2 98%   BMI 42.99 kg/m     Wt Readings from Last 3 Encounters:  07/27/16 (!) 334 lb 12.8 oz (151.9 kg)  07/19/16 (!) 340 lb (154.2 kg)  07/14/16 (!) 340 lb 6.4 oz (154.4 kg)     Physical Exam  Constitutional: He is oriented to person, place, and time. He appears well-developed and well-nourished. No distress.  Eyes: No scleral icterus.  Neck:  I cannot assess JVD  Cardiovascular: Normal rate, regular rhythm and normal heart sounds.   No murmur heard. Pulmonary/Chest: Effort normal. He has no wheezes. He has no rales.  Abdominal: Soft. There is no tenderness.  Musculoskeletal: He exhibits edema.  Trace-1+ bilateral LE edema  Neurological: He is alert and oriented to person, place, and time.  Skin: Skin is  warm and dry.  Psychiatric: He has a normal mood and affect.    ASSESSMENT:    1. Chronic diastolic CHF (congestive heart failure) ([place])   2. Essential hypertension   3. Pre-procedure lab exam    PLAN:    In order of problems listed above:  1. Chronic diastolic CHF - Objectively better with clear lungs and no edema but patient still with  Complaints of dyspnea Right heart cath Tuesday   2. Chest pain - He had resolution of his chest pain with diuresis.  Now he notes recurrent symptoms with recurrent increase in volume.  His chest pain seems to be mainly related to volume excess.  He had a normal heart cath in 2000.  Myoview in 2012 was low risk. Repeat exercise myovue abnormal with both Inferior and septal defects cath arranged for Tuesday lab called orders written and blood work today   3. L subclavian bruit -  Carotid US 05/02/16  with normal subclavian arteries bilaterally.    4. HTN - Well controlled.  Continue current medications and low sodium Dash type diet.  Some postural  Symptoms told him to hold his norvasc if this continues   5. HL - He is intol to statins.  6. Obesity:  He has been as heavy as 400 lbs Does not think weight is contributing to his symptoms Offered more elaborate cardiopulmonary stress testing to see if limitations to exercise are functional Cardiac or pulmonary and he prefers myovue   7. Depression/Anxiety:  Not clear to me that there are not issues with this. He got very emmotional at the end of Our interview and I suspect he should f/u with his primary and explore this some more may benefit from SSRI   Jenkins Rouge

## 2016-07-27 NOTE — Patient Instructions (Signed)
Medication Instructions:  Your physician recommends that you continue on your current medications as directed. Please refer to the Current Medication list given to you today.  Labwork: Your physician recommends that you have lab work today: BMET, CBC, and INR/PT   Testing/Procedures: Your physician has requested that you have a cardiac catheterization. Cardiac catheterization is used to diagnose and/or treat various heart conditions. Doctors may recommend this procedure for a number of different reasons. The most common reason is to evaluate chest pain. Chest pain can be a symptom of coronary artery disease (CAD), and cardiac catheterization can show whether plaque is narrowing or blocking your heart's arteries. This procedure is also used to evaluate the valves, as well as measure the blood flow and oxygen levels in different parts of your heart. For further information please visit [email or web address]. Please follow instruction sheet, as given.  Follow-Up: Your physician wants you to follow-up in: next available with Dr. Johnsie Cancel after cardiac catheterization.   If you need a refill on your cardiac medications before your next appointment, please call your pharmacy.

## 2016-07-28 ENCOUNTER — Other Ambulatory Visit: Payer: Self-pay | Admitting: Cardiovascular Disease

## 2016-07-28 DIAGNOSIS — R079 Chest pain, unspecified: Secondary | ICD-10-CM

## 2016-07-31 ENCOUNTER — Telehealth: Payer: Self-pay | Admitting: Cardiovascular Disease

## 2016-07-31 NOTE — Telephone Encounter (Signed)
Follow Up:    Pt would like his lab results from 07-27-16 please. He is scheduled for a Cath this week.When you call at [facility] ask them to page him .

## 2016-07-31 NOTE — Telephone Encounter (Signed)
Called patient.  Advised of normal labs.  Patient expressed understanding. Released labs to [facility].

## 2016-07-31 NOTE — Telephone Encounter (Signed)
Not completed by Legrand Como

## 2016-08-01 ENCOUNTER — Encounter (HOSPITAL_COMMUNITY)
Admission: RE | Disposition: A | Discharge status: Home / Self Care | Payer: Self-pay | Source: Ambulatory Visit | Attending: Cardiovascular Disease

## 2016-08-01 ENCOUNTER — Encounter: Payer: Self-pay | Admitting: Cardiovascular Disease

## 2016-08-01 ENCOUNTER — Ambulatory Visit (HOSPITAL_COMMUNITY)
Admission: RE | Admit: 2016-08-01 | Discharge: 2016-08-01 | Disposition: A | Discharge status: Home / Self Care | Payer: Commercial Managed Care - PPO | Source: Ambulatory Visit | Attending: Cardiovascular Disease | Admitting: Cardiovascular Disease

## 2016-08-01 DIAGNOSIS — Z8673 Personal history of transient ischemic attack (TIA), and cerebral infarction without residual deficits: Secondary | ICD-10-CM | POA: Diagnosis not present

## 2016-08-01 DIAGNOSIS — E669 Obesity, unspecified: Secondary | ICD-10-CM | POA: Insufficient documentation

## 2016-08-01 DIAGNOSIS — I5032 Chronic diastolic (congestive) heart failure: Secondary | ICD-10-CM | POA: Insufficient documentation

## 2016-08-01 DIAGNOSIS — R200 Anesthesia of skin: Secondary | ICD-10-CM | POA: Diagnosis not present

## 2016-08-01 DIAGNOSIS — Z6841 Body Mass Index (BMI) 40.0 and over, adult: Secondary | ICD-10-CM | POA: Insufficient documentation

## 2016-08-01 DIAGNOSIS — I6523 Occlusion and stenosis of bilateral carotid arteries: Secondary | ICD-10-CM | POA: Diagnosis not present

## 2016-08-01 DIAGNOSIS — E785 Hyperlipidemia, unspecified: Secondary | ICD-10-CM | POA: Insufficient documentation

## 2016-08-01 DIAGNOSIS — Z7982 Long term (current) use of aspirin: Secondary | ICD-10-CM | POA: Diagnosis not present

## 2016-08-01 DIAGNOSIS — R9439 Abnormal result of other cardiovascular function study: Secondary | ICD-10-CM | POA: Insufficient documentation

## 2016-08-01 DIAGNOSIS — R079 Chest pain, unspecified: Secondary | ICD-10-CM

## 2016-08-01 DIAGNOSIS — Z833 Family history of diabetes mellitus: Secondary | ICD-10-CM | POA: Diagnosis not present

## 2016-08-01 DIAGNOSIS — F329 Major depressive disorder, single episode, unspecified: Secondary | ICD-10-CM | POA: Insufficient documentation

## 2016-08-01 DIAGNOSIS — I110 Hypertensive heart disease with heart failure: Secondary | ICD-10-CM | POA: Diagnosis not present

## 2016-08-01 DIAGNOSIS — Z8249 Family history of ischemic heart disease and other diseases of the circulatory system: Secondary | ICD-10-CM | POA: Insufficient documentation

## 2016-08-01 DIAGNOSIS — F419 Anxiety disorder, unspecified: Secondary | ICD-10-CM | POA: Insufficient documentation

## 2016-08-01 DIAGNOSIS — Z85528 Personal history of other malignant neoplasm of kidney: Secondary | ICD-10-CM | POA: Insufficient documentation

## 2016-08-01 DIAGNOSIS — R931 Abnormal findings on diagnostic imaging of heart and coronary circulation: Secondary | ICD-10-CM | POA: Diagnosis not present

## 2016-08-01 DIAGNOSIS — I871 Compression of vein: Secondary | ICD-10-CM | POA: Diagnosis not present

## 2016-08-01 DIAGNOSIS — Z87891 Personal history of nicotine dependence: Secondary | ICD-10-CM | POA: Diagnosis not present

## 2016-08-01 HISTORY — PX: RIGHT/LEFT HEART CATH AND CORONARY ANGIOGRAPHY: CATH118266

## 2016-08-01 SURGERY — RIGHT/LEFT HEART CATH AND CORONARY ANGIOGRAPHY

## 2016-08-01 MED ORDER — HEPARIN (PORCINE) IN NACL 2-0.9 UNIT/ML-% IJ SOLN
INTRAMUSCULAR | Status: DC | PRN
  Administered 2016-08-01: 1500 mL

## 2016-08-01 MED ORDER — SODIUM CHLORIDE 0.9 % IV SOLN: 250.0000 mL | INTRAVENOUS | Status: DC | PRN

## 2016-08-01 MED ORDER — SODIUM CHLORIDE 0.9% FLUSH: 3.0000 mL | Freq: Two times a day (BID) | INTRAVENOUS | Status: DC

## 2016-08-01 MED ORDER — SODIUM CHLORIDE 0.9 % WEIGHT BASED INFUSION
1.0000 mL/kg/h | INTRAVENOUS | Status: DC
  Administered 2016-08-01: 1 mL/kg/h via INTRAVENOUS

## 2016-08-01 MED ORDER — LIDOCAINE HCL (PF) 1 % IJ SOLN
INTRAMUSCULAR | Status: DC | PRN
Start: 2016-08-01 — End: 2016-08-01
  Administered 2016-08-01 (×2): 2 mL

## 2016-08-01 MED ORDER — MIDAZOLAM HCL 2 MG/2ML IJ SOLN
INTRAMUSCULAR | Status: AC
  Filled 2016-08-01: qty 2

## 2016-08-01 MED ORDER — HEPARIN (PORCINE) IN NACL 2-0.9 UNIT/ML-% IJ SOLN
INTRAMUSCULAR | Status: AC
  Filled 2016-08-01: qty 500

## 2016-08-01 MED ORDER — SODIUM CHLORIDE 0.9% FLUSH: 3.0000 mL | INTRAVENOUS | Status: DC | PRN

## 2016-08-01 MED ORDER — FENTANYL CITRATE (PF) 100 MCG/2ML IJ SOLN
INTRAMUSCULAR | Status: DC | PRN
  Administered 2016-08-01 (×2): 25 ug via INTRAVENOUS

## 2016-08-01 MED ORDER — ASPIRIN 81 MG PO CHEW: 81.0000 mg | CHEWABLE_TABLET | ORAL | Status: DC

## 2016-08-01 MED ORDER — HEPARIN (PORCINE) IN NACL 2-0.9 UNIT/ML-% IJ SOLN
INTRAMUSCULAR | Status: AC
  Filled 2016-08-01: qty 1000

## 2016-08-01 MED ORDER — VERAPAMIL HCL 2.5 MG/ML IV SOLN
INTRAVENOUS | Status: DC | PRN
  Administered 2016-08-01: 10 mL via INTRA_ARTERIAL

## 2016-08-01 MED ORDER — FENTANYL CITRATE (PF) 100 MCG/2ML IJ SOLN
INTRAMUSCULAR | Status: AC
  Filled 2016-08-01: qty 2

## 2016-08-01 MED ORDER — SODIUM CHLORIDE 0.9 % IV SOLN: INTRAVENOUS | Status: DC

## 2016-08-01 MED ORDER — IOPAMIDOL (ISOVUE-370) INJECTION 76%
INTRAVENOUS | Status: AC
  Filled 2016-08-01: qty 100

## 2016-08-01 MED ORDER — HEPARIN SODIUM (PORCINE) 1000 UNIT/ML IJ SOLN
INTRAMUSCULAR | Status: DC | PRN
  Administered 2016-08-01: 5000 [IU] via INTRAVENOUS

## 2016-08-01 MED ORDER — ONDANSETRON HCL 4 MG/2ML IJ SOLN: 4.0000 mg | Freq: Four times a day (QID) | INTRAMUSCULAR | Status: DC | PRN

## 2016-08-01 MED ORDER — VERAPAMIL HCL 2.5 MG/ML IV SOLN
INTRAVENOUS | Status: AC
  Filled 2016-08-01: qty 2

## 2016-08-01 MED ORDER — ACETAMINOPHEN 325 MG PO TABS: 650.0000 mg | ORAL_TABLET | ORAL | Status: DC | PRN

## 2016-08-01 MED ORDER — MIDAZOLAM HCL 2 MG/2ML IJ SOLN
INTRAMUSCULAR | Status: DC | PRN
  Administered 2016-08-01 (×2): 2 mg via INTRAVENOUS

## 2016-08-01 MED ORDER — HEPARIN SODIUM (PORCINE) 1000 UNIT/ML IJ SOLN
INTRAMUSCULAR | Status: AC
  Filled 2016-08-01: qty 1

## 2016-08-01 MED ORDER — IOPAMIDOL (ISOVUE-370) INJECTION 76%
INTRAVENOUS | Status: DC | PRN
  Administered 2016-08-01: 70 mL via INTRA_ARTERIAL

## 2016-08-01 MED ORDER — SODIUM CHLORIDE 0.9 % WEIGHT BASED INFUSION
3.0000 mL/kg/h | INTRAVENOUS | Status: DC
  Administered 2016-08-01: 3 mL/kg/h via INTRAVENOUS

## 2016-08-01 MED ORDER — LIDOCAINE HCL (PF) 1 % IJ SOLN
INTRAMUSCULAR | Status: AC
  Filled 2016-08-01: qty 30

## 2016-08-01 SURGICAL SUPPLY — 14 items
CATH 5FR JL3.5 JR4 ANG PIG MP (CATHETERS) ×1 IMPLANT
CATH BALLN WEDGE 5F 110CM (CATHETERS) ×1 IMPLANT
DEVICE RAD COMP TR BAND LRG (VASCULAR PRODUCTS) ×1 IMPLANT
GLIDESHEATH INTRODUCER 5F 10CM (SHEATH) ×1 IMPLANT
GLIDESHEATH SLEND SS 6F .021 (SHEATH) ×1 IMPLANT
GUIDEWIRE .025 260CM (WIRE) ×1 IMPLANT
GUIDEWIRE INQWIRE 1.5J.035X260 (WIRE) IMPLANT
INQWIRE 1.5J .035X260CM (WIRE) ×2
KIT HEART LEFT (KITS) ×2 IMPLANT
PACK CARDIAC CATHETERIZATION (CUSTOM PROCEDURE TRAY) ×2 IMPLANT
SYR MEDRAD MARK V 150ML (SYRINGE) ×2 IMPLANT
TRANSDUCER W/STOPCOCK (MISCELLANEOUS) ×2 IMPLANT
TUBING CIL FLEX 10 FLL-RA (TUBING) ×2 IMPLANT
WIRE EMERALD 3MM-J .025X260CM (WIRE) ×1 IMPLANT

## 2016-08-01 NOTE — Progress Notes (Signed)
TRB removed and occlusive dressing applied to right radial site.  No complaints or complications noted

## 2016-08-01 NOTE — Progress Notes (Signed)
CTSP for left facial numbness. He has no other complaints. Points to left cheek and has an area of numbness. No arm or leg symptoms, no aphasia, no facial droop or drooling. No visual symptoms.   On exam, he is alert, oriented, but somnolent after receiving IV sedation in the cath lab. He follows all commands appropriately. Pupils are reactive, his smile is symmetric, and the strength and sensation of the arms and legs are normal.   Will reassess prior to discharge. Seems to be a very minor deficit. He understands to notify nursing staff if anything changes.   Sherren Mocha 08/01/2016 2:33 PM

## 2016-08-01 NOTE — Interval H&P Note (Signed)
Cath Lab Visit (complete for each Cath Lab visit)  Clinical Evaluation Leading to the Procedure:   ACS: No.  Non-ACS:    Anginal Classification: CCS II  Anti-ischemic medical therapy: Maximal Therapy (2 or more classes of medications)  Non-Invasive Test Results: Low-risk stress test findings: cardiac mortality <1%/year  Prior CABG: No previous CABG      History and Physical Interval Note:  08/01/2016 10:54 AM  [name]  has presented today for surgery, with the diagnosis of abnormal stress test  The various methods of treatment have been discussed with the patient and family. After consideration of risks, benefits and other options for treatment, the patient has consented to  Procedure(s): Right/Left Heart Cath and Coronary Angiography (N/A) as a surgical intervention .  The patient's history has been reviewed, patient examined, no change in status, stable for surgery.  I have reviewed the patient's chart and labs.  Questions were answered to the patient's satisfaction.     Sherren Mocha

## 2016-08-01 NOTE — H&P (View-Only) (Signed)
Cardiology Office Note:    Date:  07/27/2016   ID:  [name], DOB [birth year], MRN [id number]  PCP:  Odette Fraction, MD  Cardiologist:  Dr. Jenkins Rouge   Electrophysiologist:  Dr. Virl Axe   Referring MD: Susy Frizzle, MD   Chief Complaint  Patient presents with  . Congestive Heart Failure    History of Present Illness:    [name] is a 51 y.o. male with a hx of HTN, renal CA, prior TIA.  I have not seen him since 2015  LHC in 2000 was reportedly normal.  He was seen by Dr. Jenkins Rouge in 2015 and referred to EP for consideration of ILR.  The patient declined monitoring at that time.  He was seen by his PCP in 8/17 for chest pain.  FU with Cardiology was recommended. Seen by PA 05/03/16    His symptoms were suggestive of angina. However, he was volume overloaded and had similar symptoms prior to a normal cardiac catheterization in 2000. At that time, he responded to diuresis. Paced him on Lasix and set him up for an echocardiogram.  Echocardiogram was done 05/02/16 and demonstrated normal LV function with mild diastolic dysfunction and moderate right atrial enlargement.  He returns for follow-up.  I saw him 2 years ago  He seems upset about his previous care in hospital 2 years ago and not Understanding the difference between diastolic and systolic dysfunction. When I tried to discuss weight Loss with him he got very defensive and said " don't look at me as just a fat person".  He seems to have Some issues with family history of kidney problems and finding his father dead in bed.  Feels exhausted mild chest pain continued dyspnea at work He works at [employer] and finds It harder to push carts and ambulate.   Myovue  Low risk but not normal   The left ventricular ejection fraction is normal (55-65%).  Nuclear stress EF: 58%.  There was no ST segment deviation noted during stress.  Defect 1: There is a small defect of severe severity present in the  apical septal location.  Findings consistent with prior myocardial infarction.  This is a low risk study.    Long discussion with patient regarding abnormalities and options Favor right and left cath risks including Stroke , bleeding, MI and need for surgery discussed Wants to proceed ASAP.  Had SSCP after stress Test and took 3 nitro  Low risk stress nuclear study with a small apicoseptal scar and diaphragmatic attenuation artifact. No reversible ischemia is seen. Normal left ventricular global systolic function.  Prior CV studies that were reviewed today include:    Echo 11/17:  EF 60-65 %, normal wall motion, grade 1 diastolic dysfunction, aortic sclerosis without stenosis, trivial MR, normal RVSF, moderate RAE  Carotid US 11/17:  Stable 1-39% bilateral ICA stenosis; Normal subclavian arteries, bilaterally.  Carotid US 9/15 BIlateral: ICA. 1-39%   Echo 9/15 Mild conc LVH, vigorous LVF, EF 65-70, no RWMA, Gr 1 DD, trivial pericardial eff   Myoview 3/12 IMPRESSION: 1. No evidence of exercise-induced myocardial ischemia. 2. Left ventricular wall motion study is within normal limits, with calculated ejection fraction of 57%.  Past Medical History:  Diagnosis Date  . History of Doppler ultrasound    Carotid US 11/17: Stable 1-39% bilateral ICA stenosis; Normal subclavian arteries, bilaterally.  Marland Kitchen History of echocardiogram    Echo 11/17: EF 60-65, normal wall motion, grade 1 diastolic dysfunction,  aortic sclerosis without stenosis, trivial MR, normal RVSF, moderate RAE  . Hyperlipidemia 02/21/14   diet controlled, no medications  . Left acetabular fracture ([place])    nonoperative around age 49  . Stroke ([place]) 02/21/14   TIA - no limitaions from stroke  . Ulcer ([place])    Hx - no longer a problem per patient    Past Surgical History:  Procedure Laterality Date  . COSMETIC SURGERY     Dermal tatoo removal face from accident  . KNEE ARTHROSCOPY W/ MENISCAL REPAIR      Both knees  . TONSILLECTOMY    . UPPER GASTROINTESTINAL ENDOSCOPY     hx ulcer, no longer a problem per patient  . WISDOM TOOTH EXTRACTION      Current Medications: Current Meds  Medication Sig  . amLODipine (NORVASC) 5 MG tablet Take 1 tablet (5 mg total) by mouth daily.  Marland Kitchen aspirin EC 81 MG tablet Take 1 tablet (81 mg total) by mouth daily.  . benazepril (LOTENSIN) 40 MG tablet Take 1 tablet (40 mg total) by mouth daily.  . furosemide (LASIX) 40 MG tablet Take 1.5 tablets (60 mg total) by mouth daily.  . metoprolol succinate (TOPROL-XL) 50 MG 24 hr tablet Take 1 tablet (50 mg total) by mouth daily. Take with or immediately following a meal.  . nitroGLYCERIN (NITROSTAT) 0.4 MG SL tablet Place 1 tablet (0.4 mg total) under the tongue every 5 (five) minutes as needed for chest pain.  . potassium chloride SA (KLOR-CON M20) 20 MEQ tablet Take 1.5 tablets (30 mEq total) by mouth daily.     Allergies:   Demerol [meperidine] and Oxycodone   Social History   Social History  . Marital status: Single    Spouse name: N/A  . Number of children: N/A  . Years of education: N/A   Social History Main Topics  . Smoking status: Former Smoker    Packs/day: 2.00    Types: Cigarettes    Start date: 07/09/1981    Quit date: 07/10/1999  . Smokeless tobacco: Never Used  . Alcohol use No  . Drug use: No  . Sexual activity: No   Other Topics Concern  . None   Social History Narrative  . None     Family History:  The patient's family history includes Cancer in his maternal grandfather, maternal grandmother, paternal grandfather, and paternal grandmother; Colon cancer in his maternal grandfather and maternal uncle; Colon polyps in his brother; Diabetes in his brother and brother; Esophageal cancer in his maternal grandmother; Hearing loss in his maternal grandmother; Heart attack in his father; Heart disease in his brother, father, paternal grandmother, and sister; Hyperlipidemia in his brother,  brother, father, mother, and sister; Hypertension in his brother, brother, father, maternal grandmother, mother, paternal grandmother, and sister.   ROS:   Please see the history of present illness.    Review of Systems  Constitution: Positive for diaphoresis and malaise/fatigue.  Cardiovascular: Positive for chest pain, dyspnea on exertion, irregular heartbeat and leg swelling.  Respiratory: Positive for cough.   Musculoskeletal: Positive for back pain.  Gastrointestinal: Positive for nausea.  Neurological: Positive for dizziness.   All other systems reviewed and are negative.   EKGs/Labs/Other Test Reviewed:    EKG:   NSR, HR 83, normal axis, QTc 446 ms, no ST changes  Recent Labs: 01/20/2016: ALT 21 04/12/2016: Brain Natriuretic Peptide 24.9; Hemoglobin 14.4 06/08/2016: BUN 19; Creatinine, Ser 1.16; Platelets 269; Potassium 4.7; Sodium 138   Recent  Lipid Panel    Component Value Date/Time   CHOL 154 01/20/2016 1139   TRIG 79 01/20/2016 1139   HDL 43 01/20/2016 1139   CHOLHDL 3.6 01/20/2016 1139   VLDL 16 01/20/2016 1139   LDLCALC 95 01/20/2016 1139     Physical Exam:    VS:  BP 116/66   Pulse 74   Ht 6\' 2"  (1.88 m)   Wt (!) 334 lb 12.8 oz (151.9 kg)   SpO2 98%   BMI 42.99 kg/m     Wt Readings from Last 3 Encounters:  07/27/16 (!) 334 lb 12.8 oz (151.9 kg)  07/19/16 (!) 340 lb (154.2 kg)  07/14/16 (!) 340 lb 6.4 oz (154.4 kg)     Physical Exam  Constitutional: He is oriented to person, place, and time. He appears well-developed and well-nourished. No distress.  Eyes: No scleral icterus.  Neck:  I cannot assess JVD  Cardiovascular: Normal rate, regular rhythm and normal heart sounds.   No murmur heard. Pulmonary/Chest: Effort normal. He has no wheezes. He has no rales.  Abdominal: Soft. There is no tenderness.  Musculoskeletal: He exhibits edema.  Trace-1+ bilateral LE edema  Neurological: He is alert and oriented to person, place, and time.  Skin: Skin is  warm and dry.  Psychiatric: He has a normal mood and affect.    ASSESSMENT:    1. Chronic diastolic CHF (congestive heart failure) ([place])   2. Essential hypertension   3. Pre-procedure lab exam    PLAN:    In order of problems listed above:  1. Chronic diastolic CHF - Objectively better with clear lungs and no edema but patient still with  Complaints of dyspnea Right heart cath Tuesday   2. Chest pain - He had resolution of his chest pain with diuresis.  Now he notes recurrent symptoms with recurrent increase in volume.  His chest pain seems to be mainly related to volume excess.  He had a normal heart cath in 2000.  Myoview in 2012 was low risk. Repeat exercise myovue abnormal with both Inferior and septal defects cath arranged for Tuesday lab called orders written and blood work today   3. L subclavian bruit -  Carotid US 05/02/16  with normal subclavian arteries bilaterally.    4. HTN - Well controlled.  Continue current medications and low sodium Dash type diet.  Some postural  Symptoms told him to hold his norvasc if this continues   5. HL - He is intol to statins.  6. Obesity:  He has been as heavy as 400 lbs Does not think weight is contributing to his symptoms Offered more elaborate cardiopulmonary stress testing to see if limitations to exercise are functional Cardiac or pulmonary and he prefers myovue   7. Depression/Anxiety:  Not clear to me that there are not issues with this. He got very emmotional at the end of Our interview and I suspect he should f/u with his primary and explore this some more may benefit from SSRI   Jenkins Rouge

## 2016-08-01 NOTE — Discharge Instructions (Signed)
Radial Site Care °Refer to this sheet in the next few weeks. These instructions provide you with information about caring for yourself after your procedure. Your health care provider may also give you more specific instructions. Your treatment has been planned according to current medical practices, but problems sometimes occur. Call your health care provider if you have any problems or questions after your procedure. °What can I expect after the procedure? °After your procedure, it is typical to have the following: °· Bruising at the radial site that usually fades within 1-2 weeks. °· Blood collecting in the tissue (hematoma) that may be painful to the touch. It should usually decrease in size and tenderness within 1-2 weeks. °Follow these instructions at home: °· Take medicines only as directed by your health care provider. °· You may shower 24-48 hours after the procedure or as directed by your health care provider. Remove the bandage (dressing) and gently wash the site with plain soap and water. Pat the area dry with a clean towel. Do not rub the site, because this may cause bleeding. °· Do not take baths, swim, or use a hot tub until your health care provider approves. °· Check your insertion site every day for redness, swelling, or drainage. °· Do not apply powder or lotion to the site. °· Do not flex or bend the affected arm for 24 hours or as directed by your health care provider. °· Do not push or pull heavy objects with the affected arm for 24 hours or as directed by your health care provider. °· Do not lift over 10 lb (4.5 kg) for 5 days after your procedure or as directed by your health care provider. °· Ask your health care provider when it is okay to: °¨ Return to work or school. °¨ Resume usual physical activities or sports. °¨ Resume sexual activity. °· Do not drive home if you are discharged the same day as the procedure. Have someone else drive you. °· You may drive 24 hours after the procedure  unless otherwise instructed by your health care provider. °· Do not operate machinery or power tools for 24 hours after the procedure. °· If your procedure was done as an outpatient procedure, which means that you went home the same day as your procedure, a responsible adult should be with you for the first 24 hours after you arrive home. °· Keep all follow-up visits as directed by your health care provider. This is important. °Contact a health care provider if: °· You have a fever. °· You have chills. °· You have increased bleeding from the radial site. Hold pressure on the site. °Get help right away if: °· You have unusual pain at the radial site. °· You have redness, warmth, or swelling at the radial site. °· You have drainage (other than a small amount of blood on the dressing) from the radial site. °· The radial site is bleeding, and the bleeding does not stop after 30 minutes of holding steady pressure on the site. °· Your arm or hand becomes pale, cool, tingly, or numb. °This information is not intended to replace advice given to you by your health care provider. Make sure you discuss any questions you have with your health care provider. °Document Released: 06/24/2010 Document Revised: 10/28/2015 Document Reviewed: 12/08/2013 °Elsevier Interactive Patient Education © 2017 [employer] ° °

## 2016-08-01 NOTE — Progress Notes (Signed)
Went into room to remove air from TRB.  Pt asked "when will the feeling come back in the face", referring to the left side of his face.  Pt able to smile, stick out his tongue and grips are strong.  Dr Burt Knack made aware of pt's concerns and will come by tosee

## 2016-08-02 ENCOUNTER — Encounter (HOSPITAL_COMMUNITY): Payer: Self-pay | Admitting: Cardiovascular Disease

## 2016-08-03 ENCOUNTER — Ambulatory Visit (INDEPENDENT_AMBULATORY_CARE_PROVIDER_SITE_OTHER): Payer: Commercial Managed Care - PPO | Admitting: Physician Assistant

## 2016-08-03 ENCOUNTER — Ambulatory Visit: Payer: Commercial Managed Care - PPO | Admitting: Cardiovascular Disease

## 2016-08-03 VITALS — BP 124/72 | HR 91 | Temp 98.1°F | Resp 18 | Wt 338.4 lb

## 2016-08-03 DIAGNOSIS — J208 Acute bronchitis due to other specified organisms: Secondary | ICD-10-CM

## 2016-08-03 MED ORDER — PREDNISONE 20 MG PO TABS: 20.0000 mg | ORAL_TABLET | Freq: Every day | ORAL | 0 refills | Status: DC

## 2016-08-03 MED ORDER — ALBUTEROL SULFATE HFA 108 (90 BASE) MCG/ACT IN AERS: 2.0000 | INHALATION_SPRAY | Freq: Four times a day (QID) | RESPIRATORY_TRACT | 2 refills | Status: DC | PRN

## 2016-08-03 NOTE — Progress Notes (Signed)
Patient ID: [name] MRN: [id number], DOB: May 15, 1966, 51 y.o. Date of Encounter: 08/03/2016, 10:49 AM    Chief Complaint:  Chief Complaint  Patient presents with  . Cough    x2days  . low grade fever  . Nasal Congestion  . Wheezing     HPI: 51 y.o. year old male presents with above.   Reviewed that he recently had cardiac catheterization. Reviewed that I see no indication of h/o COPD, is on no meds for asthma or COPD.  Reports that it was on Tuesday (which was day before yesterday) that he developed cough. Says that that night the cough increased. States that Wednesday (which was yesterday) he had fever 100.4. Used Aleve and NyQuil. This morning temperature read 99. States that he has gotten no mucus from his nose, no drainage from the nose. At times his nose will feel a little congested but other times feels clear.  Has had a little bit of a sore throat, but feels like it is mostly all in his chest. Having cough. At times feels as if he is wheezing. Says that he has diastolic heart failure and pulmonary hypertension. Because of this he does sleep propped up. However usually does not have this sensation of wheezing that he has had new to him just over the last couple of days. Oxygen saturation here 98%.     Home Meds:   Outpatient Medications Prior to Visit  Medication Sig Dispense Refill  . amLODipine (NORVASC) 5 MG tablet Take 1 tablet (5 mg total) by mouth daily. 90 tablet 3  . aspirin EC 81 MG tablet Take 1 tablet (81 mg total) by mouth daily. 90 tablet 3  . benazepril (LOTENSIN) 40 MG tablet Take 1 tablet (40 mg total) by mouth daily. 90 tablet 3  . furosemide (LASIX) 40 MG tablet Take 1.5 tablets (60 mg total) by mouth daily. 135 tablet 3  . metoprolol succinate (TOPROL-XL) 50 MG 24 hr tablet Take 1 tablet (50 mg total) by mouth daily. Take with or immediately following a meal. 90 tablet 3  . nitroGLYCERIN (NITROSTAT) 0.4 MG SL tablet Place 1 tablet (0.4 mg total) under  the tongue every 5 (five) minutes as needed for chest pain. 25 tablet 1  . potassium chloride SA (KLOR-CON M20) 20 MEQ tablet Take 1.5 tablets (30 mEq total) by mouth daily. 135 tablet 3  . ranitidine (ZANTAC) 150 MG tablet Take 150 mg by mouth daily as needed for heartburn.     No facility-administered medications prior to visit.     Allergies:  Allergies  Allergen Reactions  . Demerol [Meperidine] Anaphylaxis  . Oxycodone Rash      Review of Systems: See HPI for pertinent ROS. All other ROS negative.    Physical Exam: Blood pressure 124/72, pulse 91, temperature 98.1 F (36.7 C), temperature source Oral, resp. rate 18, weight (!) 338 lb 6.4 oz (153.5 kg), SpO2 98 %., Body mass index is 42.87 kg/m. General:  WM. Appears in no acute distress. HEENT: Normocephalic, atraumatic, eyes without discharge, sclera non-icteric, nares are without discharge. Bilateral auditory canals clear, TM's are without perforation, pearly grey and translucent with reflective cone of light bilaterally. Oral cavity moist, posterior pharynx without exudate, erythema, peritonsillar abscess.  Neck: Supple. No thyromegaly. No lymphadenopathy. Lungs: Very slight wheeze scattered throughout bilaterally. O/W clear, good air movement, good breath sounds. Heart: Regular rhythm. No murmurs, rubs, or gallops. Msk:  Strength and tone normal for age. Extremities/Skin: Warm and dry.  No LE edema. Neuro: Alert and oriented X 3. Moves all extremities spontaneously. Gait is normal. CNII-XII grossly in tact. Psych:  Responds to questions appropriately with a normal affect.     ASSESSMENT AND PLAN:  51 y.o. year old male with  1. Viral bronchitis Discussed with him that I do not think this is influenza. Also that it seems more consistent with a viral infection rather than bacterial at this point. Discussed that I do hear very slight wheeze and he is reporting some slight wheezing at home. Therefore, will add low-dose  prednisone and he can use albuterol if indicated. Recommend Mucinex DM as expectorant. follow up if symptoms worsen or fever reoccurs. F/U if symptoms persist greater than a week without resolving. - predniSONE (DELTASONE) 20 MG tablet; Take 1 tablet (20 mg total) by mouth daily with breakfast.  Dispense: 5 tablet; Refill: 0 - albuterol (PROVENTIL HFA;VENTOLIN HFA) 108 (90 Base) MCG/ACT inhaler; Inhale 2 puffs into the lungs every 6 (six) hours as needed for wheezing or shortness of breath.  Dispense: 1 Inhaler; Refill: 2   Signed, [address] [place], [place], [facility] 08/03/2016 10:49 AM

## 2016-08-06 ENCOUNTER — Observation Stay (HOSPITAL_COMMUNITY)
Admission: EM | Admit: 2016-08-06 | Discharge: 2016-08-07 | Disposition: A | Discharge status: Home / Self Care | Payer: Commercial Managed Care - PPO | Attending: Internal Medicine | Admitting: Internal Medicine

## 2016-08-06 ENCOUNTER — Encounter (HOSPITAL_COMMUNITY): Payer: Self-pay | Admitting: Emergency Medicine

## 2016-08-06 DIAGNOSIS — Z7982 Long term (current) use of aspirin: Secondary | ICD-10-CM | POA: Insufficient documentation

## 2016-08-06 DIAGNOSIS — I5032 Chronic diastolic (congestive) heart failure: Secondary | ICD-10-CM | POA: Diagnosis not present

## 2016-08-06 DIAGNOSIS — K259 Gastric ulcer, unspecified as acute or chronic, without hemorrhage or perforation: Secondary | ICD-10-CM | POA: Diagnosis not present

## 2016-08-06 DIAGNOSIS — K921 Melena: Secondary | ICD-10-CM | POA: Diagnosis not present

## 2016-08-06 DIAGNOSIS — K2970 Gastritis, unspecified, without bleeding: Secondary | ICD-10-CM | POA: Diagnosis not present

## 2016-08-06 DIAGNOSIS — I10 Essential (primary) hypertension: Secondary | ICD-10-CM | POA: Diagnosis not present

## 2016-08-06 DIAGNOSIS — Z85528 Personal history of other malignant neoplasm of kidney: Secondary | ICD-10-CM | POA: Insufficient documentation

## 2016-08-06 DIAGNOSIS — K222 Esophageal obstruction: Secondary | ICD-10-CM | POA: Diagnosis not present

## 2016-08-06 DIAGNOSIS — Z79899 Other long term (current) drug therapy: Secondary | ICD-10-CM | POA: Diagnosis not present

## 2016-08-06 DIAGNOSIS — Z8673 Personal history of transient ischemic attack (TIA), and cerebral infarction without residual deficits: Secondary | ICD-10-CM | POA: Insufficient documentation

## 2016-08-06 DIAGNOSIS — Z87891 Personal history of nicotine dependence: Secondary | ICD-10-CM | POA: Diagnosis not present

## 2016-08-06 DIAGNOSIS — K3189 Other diseases of stomach and duodenum: Secondary | ICD-10-CM | POA: Diagnosis not present

## 2016-08-06 DIAGNOSIS — I110 Hypertensive heart disease with heart failure: Secondary | ICD-10-CM | POA: Insufficient documentation

## 2016-08-06 DIAGNOSIS — K210 Gastro-esophageal reflux disease with esophagitis: Secondary | ICD-10-CM | POA: Insufficient documentation

## 2016-08-06 DIAGNOSIS — K922 Gastrointestinal hemorrhage, unspecified: Secondary | ICD-10-CM | POA: Diagnosis present

## 2016-08-06 LAB — COMPREHENSIVE METABOLIC PANEL
ALK PHOS: 53 U/L (ref 38–126)
ALT: 24 U/L (ref 17–63)
AST: 29 U/L (ref 15–41)
Albumin: 4.4 g/dL (ref 3.5–5.0)
Anion gap: 13 (ref 5–15)
BILIRUBIN TOTAL: 0.7 mg/dL (ref 0.3–1.2)
BUN: 23 mg/dL — ABNORMAL HIGH (ref 6–20)
CO2: 21 mmol/L — ABNORMAL LOW (ref 22–32)
CREATININE: 1.19 mg/dL (ref 0.61–1.24)
Calcium: 10.2 mg/dL (ref 8.9–10.3)
Chloride: 105 mmol/L (ref 101–111)
GFR calc Af Amer: >60 mL/min (ref >60)
GFR calc non Af Amer: >60 mL/min (ref >60)
GLUCOSE: 93 mg/dL (ref 65–99)
Potassium: 4.4 mmol/L (ref 3.5–5.1)
Sodium: 139 mmol/L (ref 135–145)
TOTAL PROTEIN: 8.1 g/dL (ref 6.5–8.1)

## 2016-08-06 LAB — CBC
HCT: 37.6 % — ABNORMAL LOW (ref 39.0–52.0)
HCT: 42.6 % (ref 39.0–52.0)
Hemoglobin: 12.6 g/dL — ABNORMAL LOW (ref 13.0–17.0)
Hemoglobin: 14.2 g/dL (ref 13.0–17.0)
MCH: 28.7 pg (ref 26.0–34.0)
MCH: 28.9 pg (ref 26.0–34.0)
MCHC: 33.3 g/dL (ref 30.0–36.0)
MCHC: 33.5 g/dL (ref 30.0–36.0)
MCV: 86.1 fL (ref 78.0–100.0)
MCV: 86.2 fL (ref 78.0–100.0)
PLATELETS: 302 10*3/uL (ref 150–400)
PLATELETS: 261 10*3/uL (ref 150–400)
RBC: 4.36 MIL/uL (ref 4.22–5.81)
RBC: 4.95 MIL/uL (ref 4.22–5.81)
RDW: 13.9 % (ref 11.5–15.5)
RDW: 13.6 % (ref 11.5–15.5)
WBC: 7.6 10*3/uL (ref 4.0–10.5)
WBC: 9.8 10*3/uL (ref 4.0–10.5)

## 2016-08-06 LAB — TYPE AND SCREEN
ABO/RH(D): A POS
Antibody Screen: NEGATIVE

## 2016-08-06 LAB — ABO/RH: ABO/RH(D): A POS

## 2016-08-06 MED ORDER — SODIUM CHLORIDE 0.9 % IV SOLN
80.0000 mg | Freq: Once | INTRAVENOUS | Status: AC
  Administered 2016-08-06: 80 mg via INTRAVENOUS
  Filled 2016-08-06: qty 80

## 2016-08-06 MED ORDER — NITROGLYCERIN 0.4 MG SL SUBL: 0.4000 mg | SUBLINGUAL_TABLET | SUBLINGUAL | Status: DC | PRN

## 2016-08-06 MED ORDER — SODIUM CHLORIDE 0.9 % IV SOLN
INTRAVENOUS | Status: DC
  Administered 2016-08-07 (×2): via INTRAVENOUS

## 2016-08-06 MED ORDER — METOPROLOL TARTRATE 5 MG/5ML IV SOLN
2.5000 mg | Freq: Four times a day (QID) | INTRAVENOUS | Status: DC
  Administered 2016-08-07 (×3): 2.5 mg via INTRAVENOUS
  Filled 2016-08-06 (×3): qty 5

## 2016-08-06 MED ORDER — HYDRALAZINE HCL 20 MG/ML IJ SOLN: 10.0000 mg | INTRAMUSCULAR | Status: DC | PRN

## 2016-08-06 MED ORDER — ONDANSETRON HCL 4 MG/2ML IJ SOLN
4.0000 mg | Freq: Once | INTRAMUSCULAR | Status: AC
  Administered 2016-08-06: 4 mg via INTRAVENOUS
  Filled 2016-08-06: qty 2

## 2016-08-06 MED ORDER — SODIUM CHLORIDE 0.9 % IV SOLN
8.0000 mg/h | INTRAVENOUS | Status: DC
  Administered 2016-08-07 (×2): 8 mg/h via INTRAVENOUS
  Filled 2016-08-06 (×5): qty 80

## 2016-08-06 MED ORDER — ONDANSETRON HCL 4 MG PO TABS: 4.0000 mg | ORAL_TABLET | Freq: Four times a day (QID) | ORAL | Status: DC | PRN

## 2016-08-06 MED ORDER — ONDANSETRON HCL 4 MG/2ML IJ SOLN
4.0000 mg | Freq: Four times a day (QID) | INTRAMUSCULAR | Status: DC | PRN
  Administered 2016-08-07 (×2): 4 mg via INTRAVENOUS
  Filled 2016-08-06 (×2): qty 2

## 2016-08-06 MED ORDER — ONDANSETRON 4 MG PO TBDP: 4.0000 mg | ORAL_TABLET | Freq: Once | ORAL | Status: DC

## 2016-08-06 MED ORDER — SODIUM CHLORIDE 0.9 % IV SOLN: 80.0000 mg | Freq: Once | INTRAVENOUS | Status: DC

## 2016-08-06 MED ORDER — PANTOPRAZOLE SODIUM 40 MG IV SOLR: 40.0000 mg | Freq: Two times a day (BID) | INTRAVENOUS | Status: DC

## 2016-08-06 MED ORDER — ACETAMINOPHEN 650 MG RE SUPP: 650.0000 mg | Freq: Four times a day (QID) | RECTAL | Status: DC | PRN

## 2016-08-06 MED ORDER — ACETAMINOPHEN 325 MG PO TABS: 650.0000 mg | ORAL_TABLET | Freq: Four times a day (QID) | ORAL | Status: DC | PRN

## 2016-08-06 NOTE — ED Notes (Signed)
Patient is stable and ready to be transport to the floor at this time.  Report was called to 5W RN.  Belongings taken with the patient to the floor.   

## 2016-08-06 NOTE — ED Provider Notes (Addendum)
Emergency Department Provider Note   I have reviewed the triage vital signs and the nursing notes.   HISTORY  Chief Complaint GI Bleeding and URI   HPI [name] is a 51 y.o. male with PMH of TIA, HLD, and ulcer presents to the emergency room in for evaluation of passing black stool. Patient states that symptoms began yesterday and occurred intermittently. They've become more severe. He states at first he had lack diarrhea but now he is having black, sticky stool. He had a gastrointestinal bleed in the past related to an ulcer. He has been compliant with his medications. During that episode he is having some hematemesis which is not experiencing at this time. He is experiencing some emesis. Today at work he noticed some lightheadedness along with continued black bowel movements and so decided to present to the ED at that time. No modifying factors. He is also experiencing a burning epigastric and LUQ discomfort that is non-radiating.    Past Medical History:  Diagnosis Date  . History of Doppler ultrasound    Carotid US 11/17: Stable 1-39% bilateral ICA stenosis; Normal subclavian arteries, bilaterally.  Marland Kitchen History of echocardiogram    Echo 11/17: EF 60-65, normal wall motion, grade 1 diastolic dysfunction, aortic sclerosis without stenosis, trivial MR, normal RVSF, moderate RAE  . Hyperlipidemia 02/21/14   diet controlled, no medications  . Left acetabular fracture ([place])    nonoperative around age 62  . Stroke ([place]) 02/21/14   TIA - no limitaions from stroke  . Ulcer ([place])    Hx - no longer a problem per patient    Patient Active Problem List   Diagnosis Date Noted  . Acute GI bleeding 08/06/2016  . Exertional chest pain 08/01/2016  . Chronic diastolic CHF (congestive heart failure) ([place]) 05/03/2016  . Essential hypertension 05/03/2016  . Flank pain 11/18/2015  . HLD (hyperlipidemia) 02/21/2014  . Hyperglycemia 02/21/2014  . TIA (transient ischemic attack) 02/19/2014     Past Surgical History:  Procedure Laterality Date  . COSMETIC SURGERY     Dermal tatoo removal face from accident  . KNEE ARTHROSCOPY W/ MENISCAL REPAIR     Both knees  . RIGHT/LEFT HEART CATH AND CORONARY ANGIOGRAPHY N/A 08/01/2016   Procedure: Right/Left Heart Cath and Coronary Angiography;  Surgeon: Sherren Mocha, MD;  Location: [facility];  Service: Cardiovascular;  Laterality: N/A;  . TONSILLECTOMY    . UPPER GASTROINTESTINAL ENDOSCOPY     hx ulcer, no longer a problem per patient  . WISDOM TOOTH EXTRACTION        Allergies Demerol [meperidine] and Oxycodone  Family History  Problem Relation Age of Onset  . Hyperlipidemia Mother   . Hypertension Mother   . Heart disease Father   . Hyperlipidemia Father   . Hypertension Father   . Heart attack Father   . Heart disease Sister   . Hyperlipidemia Sister   . Hypertension Sister   . Hyperlipidemia Brother   . Hypertension Brother   . Diabetes Brother   . Colon polyps Brother   . Cancer Maternal Grandmother   . Hearing loss Maternal Grandmother   . Hypertension Maternal Grandmother   . Esophageal cancer Maternal Grandmother   . Cancer Maternal Grandfather   . Colon cancer Maternal Grandfather   . Cancer Paternal Grandmother   . Heart disease Paternal Grandmother   . Hypertension Paternal Grandmother   . Cancer Paternal Grandfather   . Diabetes Brother   . Heart disease Brother   .  Hyperlipidemia Brother   . Hypertension Brother   . Colon cancer Maternal Uncle   . Rectal cancer Neg Hx   . Stomach cancer Neg Hx     Social History Social History  Substance Use Topics  . Smoking status: Former Smoker    Packs/day: 2.00    Types: Cigarettes    Start date: 07/09/1981    Quit date: 07/10/1999  . Smokeless tobacco: Never Used  . Alcohol use No    Review of Systems  10-point ROS otherwise negative.  ____________________________________________   PHYSICAL EXAM:  VITAL SIGNS: ED Triage Vitals   Enc Vitals Group     BP 08/06/16 1559 112/64     Pulse Rate 08/06/16 1559 89     Resp 08/06/16 1559 20     Temp 08/06/16 1559 97.7 F (36.5 C)     Temp Source 08/06/16 1559 Oral     SpO2 08/06/16 1559 99 %     Weight 08/06/16 1601 (!) 338 lb (153.3 kg)     Height 08/06/16 1601 6\' 2"  (1.88 m)   Constitutional: Alert and oriented. Well appearing and in no acute distress. Eyes: Conjunctivae are normal.  Head: Atraumatic. Nose: No congestion/rhinnorhea. Mouth/Throat: Mucous membranes are moist.  Oropharynx non-erythematous. Neck: No stridor.   Cardiovascular: Normal rate, regular rhythm. Good peripheral circulation. Grossly normal heart sounds.   Respiratory: Normal respiratory effort.  No retractions. Lungs CTAB. Gastrointestinal: Soft and nontender. No distention. Melena on rectal exam. No BRB. No hemorrhoids or obvious fissure.  Musculoskeletal: No lower extremity tenderness nor edema. No gross deformities of extremities. Neurologic:  Normal speech and language. No gross focal neurologic deficits are appreciated.  Skin:  Skin is warm, dry and intact. No rash noted.  ____________________________________________   LABS (all labs ordered are listed, but only abnormal results are displayed)  Labs reviewed.   Pregnancy test ordered by nursing under hospitalist name. Unclear why this was ordered but suspect in error. I did not order this test and result did not affect my medical decision making in this case.  ____________________________________________  RADIOLOGY  None ____________________________________________   PROCEDURES  Procedure(s) performed:   Procedures  None ____________________________________________   INITIAL IMPRESSION / ASSESSMENT AND PLAN / ED COURSE  Pertinent labs & imaging results that were available during my care of the patient were reviewed by me and considered in my medical decision making (see chart for details).  Patient presents to the  emergency department for evaluation of black bowel movements. He has melena on exam. He is complaining of some burning epigastric and left upper quadrant pain. Suspect that he has a bleeding ulcer. Plan for lab work, IV placement, Protonix. Patient reports seeing Dr. Ardis Hughs in the past for colonoscopy.   08:24 PM Patient with positive hemoccult and normal H/H. Anticipate admission for Hb trending and GI consultation. Will page [facility] to discuss case.   Spoke with [employer]. Plan for endoscopy in the AM. They request that patient stay NPO.   Discussed patient's case with hospitalist, Dr. Hal Hope. Patient and family (if present) updated with plan. Care transferred to hospitalist service.  I reviewed all nursing notes, vitals, pertinent old records, EKGs, labs, imaging (as available).   ____________________________________________  FINAL CLINICAL IMPRESSION(S) / ED DIAGNOSES  Final diagnoses:  Lower GI bleed     MEDICATIONS GIVEN DURING THIS VISIT:  Medications  pantoprazole (PROTONIX) 80 mg in sodium chloride 0.9 % 100 mL IVPB (0 mg Intravenous Stopped 08/06/16 2033)  ondansetron (ZOFRAN) injection  4 mg (4 mg Intravenous Given 08/06/16 2013)     NEW OUTPATIENT MEDICATIONS STARTED DURING THIS VISIT:  None   Note:  This document was prepared using Dragon voice recognition software and may include unintentional dictation errors.  Nanda Quinton, MD Emergency Medicine  Margette Fast, MD 08/06/16 2300  Margette Fast, MD 01/23/17 1335

## 2016-08-06 NOTE — H&P (Signed)
History and Physical    [name] [id number] DOB: March 25, 1966 DOA: 08/06/2016  PCP: Odette Fraction, MD  Patient coming from: Home.  Chief Complaint: Dark stools.  HPI: [name] is a 51 y.o. male with history of hypertension, diastolic CHF, TIA and history of renal cell carcinoma presents to the ER because of melanotic stool. Patient started experiencing diarrhea with dark stools 3 days ago and last 2 days has been having melanotic stool. Patient also had a cardiac cath on February 27 last week which was showing normal coronaries. Patient has been having left lower quadrant abdominal pain when patient had diarrhea. On exam patient is nontender and no ecchymotic areas to suggest any obvious hematoma. Patient is abdominal pain-free at this time. Patient was started on prednisone and nebulizer treatment for bronchitis 4-5 days ago by patient's primary care physician. Patient is on aspirin but otherwise denies taking any NSAIDs.  ED Course: In the ER rectal exam showed melanotic stool. Hemoglobin was around 14. Patient was hemodynamically stable. ER physician had discussed with him about gastroenterologist who is planning to do endoscopy in the morning and had requested patient to be placed nothing by mouth and on Protonix infusion.  Review of Systems: As per HPI, rest all negative.   Past Medical History:  Diagnosis Date  . History of Doppler ultrasound    Carotid US 11/17: Stable 1-39% bilateral ICA stenosis; Normal subclavian arteries, bilaterally.  Marland Kitchen History of echocardiogram    Echo 11/17: EF 60-65, normal wall motion, grade 1 diastolic dysfunction, aortic sclerosis without stenosis, trivial MR, normal RVSF, moderate RAE  . Hyperlipidemia 02/21/14   diet controlled, no medications  . Left acetabular fracture ([place])    nonoperative around age 72  . Stroke ([place]) 02/21/14   TIA - no limitaions from stroke  . Ulcer ([place])    Hx - no longer a problem per patient    Past Surgical  History:  Procedure Laterality Date  . COSMETIC SURGERY     Dermal tatoo removal face from accident  . KNEE ARTHROSCOPY W/ MENISCAL REPAIR     Both knees  . RIGHT/LEFT HEART CATH AND CORONARY ANGIOGRAPHY N/A 08/01/2016   Procedure: Right/Left Heart Cath and Coronary Angiography;  Surgeon: Sherren Mocha, MD;  Location: [facility];  Service: Cardiovascular;  Laterality: N/A;  . TONSILLECTOMY    . UPPER GASTROINTESTINAL ENDOSCOPY     hx ulcer, no longer a problem per patient  . WISDOM TOOTH EXTRACTION       reports that he quit smoking about 17 years ago. His smoking use included Cigarettes. He started smoking about 35 years ago. He smoked 2.00 packs per day. He has never used smokeless tobacco. He reports that he does not drink alcohol or use drugs.  Allergies  Allergen Reactions  . Demerol [Meperidine] Anaphylaxis  . Oxycodone Rash    Family History  Problem Relation Age of Onset  . Hyperlipidemia Mother   . Hypertension Mother   . Heart disease Father   . Hyperlipidemia Father   . Hypertension Father   . Heart attack Father   . Heart disease Sister   . Hyperlipidemia Sister   . Hypertension Sister   . Hyperlipidemia Brother   . Hypertension Brother   . Diabetes Brother   . Colon polyps Brother   . Cancer Maternal Grandmother   . Hearing loss Maternal Grandmother   . Hypertension Maternal Grandmother   . Esophageal cancer Maternal Grandmother   . Cancer Maternal  Grandfather   . Colon cancer Maternal Grandfather   . Cancer Paternal Grandmother   . Heart disease Paternal Grandmother   . Hypertension Paternal Grandmother   . Cancer Paternal Grandfather   . Diabetes Brother   . Heart disease Brother   . Hyperlipidemia Brother   . Hypertension Brother   . Colon cancer Maternal Uncle   . Rectal cancer Neg Hx   . Stomach cancer Neg Hx     Prior to Admission medications   Medication Sig Start Date End Date Taking? Authorizing Provider  albuterol (PROVENTIL  HFA;VENTOLIN HFA) 108 (90 Base) MCG/ACT inhaler Inhale 2 puffs into the lungs every 6 (six) hours as needed for wheezing or shortness of breath. 08/03/16  Yes Mary B Dixon, PA-C  amLODipine (NORVASC) 5 MG tablet Take 1 tablet (5 mg total) by mouth daily. 05/30/16 08/28/16 Yes Scott T Kathlen Mody, PA-C  aspirin EC 81 MG tablet Take 1 tablet (81 mg total) by mouth daily. 04/11/16  Yes Scott T Weaver, PA-C  benazepril (LOTENSIN) 40 MG tablet Take 1 tablet (40 mg total) by mouth daily. 02/04/16  Yes Susy Frizzle, MD  Dextromethorphan-Guaifenesin [facility] DM) 30-600 MG TB12 Take 1 tablet by mouth 2 (two) times daily as needed (CONGESTION).   Yes Historical Provider, MD  furosemide (LASIX) 40 MG tablet Take 1.5 tablets (60 mg total) by mouth daily. 06/01/16 08/30/16 Yes Scott T Weaver, PA-C  Menthol (RICOLA) LOZG Use as directed 1 lozenge in the mouth or throat daily as needed (SORE THROAT).   Yes Historical Provider, MD  metoprolol succinate (TOPROL-XL) 50 MG 24 hr tablet Take 1 tablet (50 mg total) by mouth daily. Take with or immediately following a meal. 02/04/16  Yes Susy Frizzle, MD  nitroGLYCERIN (NITROSTAT) 0.4 MG SL tablet Place 1 tablet (0.4 mg total) under the tongue every 5 (five) minutes as needed for chest pain. 07/14/16 10/12/16 Yes Josue Hector, MD  potassium chloride SA (KLOR-CON M20) 20 MEQ tablet Take 1.5 tablets (30 mEq total) by mouth daily. 06/01/16 08/30/16 Yes Scott T Kathlen Mody, PA-C  predniSONE (DELTASONE) 20 MG tablet Take 1 tablet (20 mg total) by mouth daily with breakfast. 08/03/16  Yes Orlena Sheldon, PA-C  ranitidine (ZANTAC) 150 MG tablet Take 150 mg by mouth at bedtime.    Yes Historical Provider, MD    Physical Exam: Vitals:   08/06/16 1559 08/06/16 1601 08/06/16 2131 08/06/16 2226  BP: 112/64  122/65 (!) 123/59  Pulse: 89  80 90  Resp: 20  18 18   Temp: 97.7 F (36.5 C)   98.2 F (36.8 C)  TempSrc: Oral   Oral  SpO2: 99%  100% 98%  Weight:  (!) 153.3 kg (338 lb) (!) 149.7 kg  (330 lb 1.6 oz)   Height:  6\' 2"  (1.88 m)        Constitutional: Moderately built and nourished. Vitals:   08/06/16 1559 08/06/16 1601 08/06/16 2131 08/06/16 2226  BP: 112/64  122/65 (!) 123/59  Pulse: 89  80 90  Resp: 20  18 18   Temp: 97.7 F (36.5 C)   98.2 F (36.8 C)  TempSrc: Oral   Oral  SpO2: 99%  100% 98%  Weight:  (!) 153.3 kg (338 lb) (!) 149.7 kg (330 lb 1.6 oz)   Height:  6\' 2"  (1.88 m)     Eyes: Anicteric no pallor. ENMT: No discharge from the ears eyes nose or mouth. Neck: No mass felt and no neck rigidity. Respiratory:  No rhonchi or crepitations. Cardiovascular: S1 and S2 heard no murmurs appreciated. Abdomen: Soft nontender bowel sounds present. Musculoskeletal: No edema. No joint effusion. Skin: No ecchymotic areas on the abdomen. Neurologic: Alert awake oriented to time place and person. Moves all extremities. Psychiatric: Appears normal. Normal affect.   Labs on Admission: I have personally reviewed following labs and imaging studies  CBC:  Recent Labs Lab 08/06/16 1829  WBC 9.8  HGB 14.2  HCT 42.6  MCV 86.1  PLT 99991111   Basic Metabolic Panel:  Recent Labs Lab 08/06/16 1829  NA 139  K 4.4  CL 105  CO2 21*  GLUCOSE 93  BUN 23*  CREATININE 1.19  CALCIUM 10.2   GFR: Estimated Creatinine Clearance: 110.9 mL/min (by C-G formula based on SCr of 1.19 mg/dL). Liver Function Tests:  Recent Labs Lab 08/06/16 1829  AST 29  ALT 24  ALKPHOS 53  BILITOT 0.7  PROT 8.1  ALBUMIN 4.4   No results for input(s): LIPASE, AMYLASE in the last 168 hours. No results for input(s): AMMONIA in the last 168 hours. Coagulation Profile: No results for input(s): INR, PROTIME in the last 168 hours. Cardiac Enzymes: No results for input(s): CKTOTAL, CKMB, CKMBINDEX, TROPONINI in the last 168 hours. BNP (last 3 results) No results for input(s): PROBNP in the last 8760 hours. HbA1C: No results for input(s): HGBA1C in the last 72 hours. CBG: No results  for input(s): GLUCAP in the last 168 hours. Lipid Profile: No results for input(s): CHOL, HDL, LDLCALC, TRIG, CHOLHDL, LDLDIRECT in the last 72 hours. Thyroid Function Tests: No results for input(s): TSH, T4TOTAL, FREET4, T3FREE, THYROIDAB in the last 72 hours. Anemia Panel: No results for input(s): VITAMINB12, FOLATE, FERRITIN, TIBC, IRON, RETICCTPCT in the last 72 hours. Urine analysis:    Component Value Date/Time   COLORURINE AMBER (A) 04/07/2014 1540   APPEARANCEUR CLOUDY (A) 04/07/2014 1540   LABSPEC 1.025 04/07/2014 1540   PHURINE 5.5 04/07/2014 1540   GLUCOSEU NEG 04/07/2014 1540   HGBUR LARGE (A) 04/07/2014 1540   BILIRUBINUR negative 11/19/2015 1239   BILIRUBINUR neg 07/09/2012 1740   KETONESUR trace (5) (A) 11/19/2015 1239   KETONESUR 15 (A) 04/07/2014 1540   PROTEINUR trace (A) 11/19/2015 1239   PROTEINUR NEG 04/07/2014 1540   UROBILINOGEN 2.0 11/19/2015 1239   UROBILINOGEN 0.2 04/07/2014 1540   NITRITE Negative 11/19/2015 1239   NITRITE NEG 04/07/2014 1540   LEUKOCYTESUR Negative 11/19/2015 1239   Sepsis Labs: @LABRCNTIP (procalcitonin:4,lacticidven:4) )No results found for this or any previous visit (from the past 240 hour(s)).   Radiological Exams on Admission: No results found.   Assessment/Plan Principal Problem:   Acute GI bleeding Active Problems:   Chronic diastolic CHF (congestive heart failure) ([place])   Essential hypertension    1. Acute GI bleeding - given the melanotic stool likely upper GI. Patient is placed on Protonix infusion. Follow serial CBCs. Gastroenterologist has been consulted and EGD planned in the a.m. 2. Hypertension - patient is placed on scheduled IV metoprolol and when necessary IV hydralazine. 3. Chronic diastolic CHF - last cardiac cath done last week showed normal coronaries. At this time holding the Lasix. 4. History of TIA - holding off aspirin due to acute GI bleed.   DVT prophylaxis: SCDs. Code Status: Full code.    Family Communication: Discussed with patient.  Disposition Plan: Home.  Consults called: Gastroenterologist.  Admission status: Observation.    Rise Patience MD [facility] Pager [phone].  If 7PM-7AM, please contact  night-coverage [email or web address] Password [email or web address]  08/06/2016, 11:11 PM

## 2016-08-06 NOTE — ED Notes (Signed)
Pt difficult lab stick, attempted x 2.

## 2016-08-06 NOTE — ED Triage Notes (Signed)
Pt c/o dark tarry stools x 2-- one Friday, one Saturday, nausea, has had a URI also since Friday, -- had a heart cath on Tuesday, taking ASA daily. Saw dr on Friday for upper resp infection, started on albuterol inhaler and prednisone po, took Nyquil Thursday night for cold symptoms.  Does have a hx of an ulcer-- with vomiting blood in 2002.  Mucuos membranes pink, moist, color- pink.

## 2016-08-07 ENCOUNTER — Observation Stay (HOSPITAL_COMMUNITY): Payer: Commercial Managed Care - PPO | Admitting: Anesthesiology

## 2016-08-07 ENCOUNTER — Encounter (HOSPITAL_COMMUNITY): Payer: Self-pay | Admitting: Physician Assistant

## 2016-08-07 ENCOUNTER — Encounter (HOSPITAL_COMMUNITY)
Admission: EM | Disposition: A | Discharge status: Home / Self Care | Payer: Self-pay | Source: Home / Self Care | Attending: Emergency Medicine

## 2016-08-07 DIAGNOSIS — I5032 Chronic diastolic (congestive) heart failure: Secondary | ICD-10-CM

## 2016-08-07 DIAGNOSIS — I10 Essential (primary) hypertension: Secondary | ICD-10-CM | POA: Diagnosis not present

## 2016-08-07 DIAGNOSIS — K2970 Gastritis, unspecified, without bleeding: Secondary | ICD-10-CM

## 2016-08-07 DIAGNOSIS — K210 Gastro-esophageal reflux disease with esophagitis: Secondary | ICD-10-CM | POA: Diagnosis not present

## 2016-08-07 DIAGNOSIS — K259 Gastric ulcer, unspecified as acute or chronic, without hemorrhage or perforation: Secondary | ICD-10-CM | POA: Diagnosis not present

## 2016-08-07 DIAGNOSIS — K922 Gastrointestinal hemorrhage, unspecified: Secondary | ICD-10-CM | POA: Diagnosis not present

## 2016-08-07 DIAGNOSIS — K921 Melena: Secondary | ICD-10-CM

## 2016-08-07 DIAGNOSIS — K3189 Other diseases of stomach and duodenum: Secondary | ICD-10-CM

## 2016-08-07 HISTORY — PX: ESOPHAGOGASTRODUODENOSCOPY: SHX5428

## 2016-08-07 LAB — BASIC METABOLIC PANEL
ANION GAP: 6 (ref 5–15)
BUN: 23 mg/dL — ABNORMAL HIGH (ref 6–20)
CO2: 24 mmol/L (ref 22–32)
CREATININE: 1.17 mg/dL (ref 0.61–1.24)
Calcium: 9 mg/dL (ref 8.9–10.3)
Chloride: 109 mmol/L (ref 101–111)
GFR calc non Af Amer: >60 mL/min (ref >60)
GLUCOSE: 103 mg/dL — AB (ref 65–99)
POTASSIUM: 4.1 mmol/L (ref 3.5–5.1)
Sodium: 139 mmol/L (ref 135–145)

## 2016-08-07 LAB — CBC
HCT: 39.2 % (ref 39.0–52.0)
HEMATOCRIT: 38.5 % — AB (ref 39.0–52.0)
HEMOGLOBIN: 12.5 g/dL — AB (ref 13.0–17.0)
HEMOGLOBIN: 12.7 g/dL — AB (ref 13.0–17.0)
MCH: 28.2 pg (ref 26.0–34.0)
MCH: 28.8 pg (ref 26.0–34.0)
MCHC: 32.5 g/dL (ref 30.0–36.0)
MCHC: 32.4 g/dL (ref 30.0–36.0)
MCV: 86.9 fL (ref 78.0–100.0)
MCV: 88.9 fL (ref 78.0–100.0)
PLATELETS: 232 10*3/uL (ref 150–400)
Platelets: 272 10*3/uL (ref 150–400)
RBC: 4.43 MIL/uL (ref 4.22–5.81)
RBC: 4.41 MIL/uL (ref 4.22–5.81)
RDW: 13.8 % (ref 11.5–15.5)
RDW: 14.3 % (ref 11.5–15.5)
WBC: 7.7 10*3/uL (ref 4.0–10.5)
WBC: 7.3 10*3/uL (ref 4.0–10.5)

## 2016-08-07 LAB — HIV ANTIBODY (ROUTINE TESTING W REFLEX): HIV SCREEN 4TH GENERATION: NONREACTIVE

## 2016-08-07 SURGERY — EGD (ESOPHAGOGASTRODUODENOSCOPY)
Anesthesia: Monitor Anesthesia Care

## 2016-08-07 SURGERY — CANCELLED PROCEDURE

## 2016-08-07 MED ORDER — PANTOPRAZOLE SODIUM 40 MG PO TBEC: DELAYED_RELEASE_TABLET | ORAL | 0 refills | Status: DC

## 2016-08-07 MED ORDER — ALBUTEROL SULFATE (2.5 MG/3ML) 0.083% IN NEBU
2.5000 mg | INHALATION_SOLUTION | RESPIRATORY_TRACT | Status: DC
  Administered 2016-08-07 (×2): 2.5 mg via RESPIRATORY_TRACT
  Filled 2016-08-07 (×2): qty 3

## 2016-08-07 MED ORDER — BUDESONIDE 0.25 MG/2ML IN SUSP
0.2500 mg | Freq: Two times a day (BID) | RESPIRATORY_TRACT | Status: DC
  Administered 2016-08-07: 0.25 mg via RESPIRATORY_TRACT
  Filled 2016-08-07: qty 2

## 2016-08-07 MED ORDER — PROPOFOL 10 MG/ML IV BOLUS
INTRAVENOUS | Status: DC | PRN
  Administered 2016-08-07: 50 mg via INTRAVENOUS

## 2016-08-07 MED ORDER — PROPOFOL 500 MG/50ML IV EMUL
INTRAVENOUS | Status: DC | PRN
  Administered 2016-08-07: 100 ug/kg/min via INTRAVENOUS

## 2016-08-07 MED ORDER — ALBUTEROL SULFATE (2.5 MG/3ML) 0.083% IN NEBU
2.5000 mg | INHALATION_SOLUTION | Freq: Three times a day (TID) | RESPIRATORY_TRACT | Status: DC
  Administered 2016-08-07: 2.5 mg via RESPIRATORY_TRACT
  Filled 2016-08-07: qty 3

## 2016-08-07 MED ORDER — ALBUTEROL SULFATE (2.5 MG/3ML) 0.083% IN NEBU
2.5000 mg | INHALATION_SOLUTION | RESPIRATORY_TRACT | Status: DC | PRN
  Filled 2016-08-07: qty 3

## 2016-08-07 NOTE — Op Note (Signed)
[facility] Patient Name: [name] Procedure Date : 08/07/2016 MRN: [id number] Attending MD: Carlota Raspberry. Armbruster MD, MD Date of Birth: 14-Feb-1966 CSN: [id number] Age: 51 Admit Type: Inpatient Procedure:                Upper GI endoscopy Indications:              [place] Providers:                Remo Lipps P. Armbruster MD, MD, Cleda Daub, RN,                            Elspeth Cho Tech., Technician, Trixie Deis, CRNA Referring MD:              Medicines:                Monitored Anesthesia Care Complications:            No immediate complications. Estimated blood loss:                            Minimal. Estimated Blood Loss:     Estimated blood loss was minimal. Procedure:                Pre-Anesthesia Assessment:                           - Prior to the procedure, a History and Physical                            was performed, and patient medications and                            allergies were reviewed. The patient's tolerance of                            previous anesthesia was also reviewed. The risks                            and benefits of the procedure and the sedation                            options and risks were discussed with the patient.                            All questions were answered, and informed consent                            was obtained. Prior Anticoagulants: The patient has                            taken aspirin, last dose was 1 day prior to                            procedure. ASA Grade Assessment: III - A patient  with severe systemic disease. After reviewing the                            risks and benefits, the patient was deemed in                            satisfactory condition to undergo the procedure.                           After obtaining informed consent, the endoscope was                            passed under direct vision. Throughout the                            procedure, the  patient's blood pressure, pulse, and                            oxygen saturations were monitored continuously. The                            [id number]-[id number] ([id number]) scope was introduced through the                            mouth, and advanced to the second part of duodenum.                            The upper GI endoscopy was accomplished without                            difficulty. The patient tolerated the procedure                            well. Scope In: Scope Out: Findings:      Esophagogastric landmarks were identified: the Z-line was found at 40       cm, the gastroesophageal junction was found at 40 cm and the upper       extent of the gastric folds was found at 40 cm from the incisors.      LA Grade B (one or more mucosal breaks greater than 5 mm, not extending       between the tops of two mucosal folds) esophagitis was found at the GEJ       along with a mild Shatski ring      The exam of the esophagus was otherwise normal.      Two non-bleeding superficial gastric ulcers with no stigmata of bleeding       were found in the gastric body and in the gastric antrum. The largest       lesion was 5 mm in largest dimension.      Patchy moderate inflammation characterized by erosions, erythema and       friability was found in the gastric antrum. Biopsies were taken with a       cold forceps for Helicobacter pylori testing.      The exam of the stomach was otherwise normal.  Diffuse mildly erythematous mucosa was found in the duodenal bulb       without ulceration.      The exam of the duodenum was otherwise normal. Impression:               - Esophagogastric landmarks identified.                           - LA Grade B reflux esophagitis with mild Shatski                            ring.                           - Normal esophagus otherwise                           - 2 Non-bleeding small clean based gastric ulcers                            with no stigmata of  bleeding.                           - Gastritis. Biopsied.                           - Erythematous duodenopathy.                           No active bleeding on this exam, no stigmata of                            bleeding, no blood noted in the bowel Moderate Sedation:      No moderate sedation, case performed with MAC Recommendation:           - Return patient to hospital ward                           - Patient is stable for discharge home at this time                            once recovered from endoscopy                           - Clear liquid diet today, soft diet tomorrow as                            tolerated                           - Protonix 9m twice daily                           - NO NSAIDs other than aspirin                           - Await pathology results, patient will be  contacted with results and further recommendations Procedure Code(s):        --- Professional ---                           [phone], Esophagogastroduodenoscopy, flexible,                            transoral; with biopsy, single or multiple Diagnosis Code(s):        --- Professional ---                           K21.0, Gastro-esophageal reflux disease with                            esophagitis                           K25.9, Gastric ulcer, unspecified as acute or                            chronic, without hemorrhage or perforation                           K29.70, Gastritis, unspecified, without bleeding                           K31.89, Other diseases of stomach and duodenum                           K92.1, Melena (includes Hematochezia) CPT copyright 2016 American Medical Association. All rights reserved. The codes documented in this report are preliminary and upon coder review may  be revised to meet current compliance requirements. Remo Lipps P. Armbruster MD, MD 08/07/2016 3:52:29 PM This report has been signed electronically. Number of Addenda: 0

## 2016-08-07 NOTE — Discharge Summary (Signed)
Physician Discharge Summary  [name] [id number] DOB: 1966-03-14 DOA: 08/06/2016  PCP: Odette Fraction, MD  Admit date: 08/06/2016 Discharge date: 08/07/2016   Recommendations for Outpatient Follow-Up:   1. protonix BID x 30 days then daily 2. GI to follow pathology   Discharge Diagnosis:   Principal Problem:   Acute GI bleeding Active Problems:   Chronic diastolic CHF (congestive heart failure) ([place])   Essential hypertension   Upper GI bleed   Discharge disposition:  Home  Discharge Condition: Improved.  Diet recommendation: Low sodium, heart healthy.  Carbohydrate-modified.  Wound care: None.   History of Present Illness:   [name] is a 51 y.o. male with history of hypertension, diastolic CHF, TIA and history of renal cell carcinoma presents to the ER because of melanotic stool. Patient started experiencing diarrhea with dark stools 3 days ago and last 2 days has been having melanotic stool. Patient also had a cardiac cath on February 27 last week which was showing normal coronaries. Patient has been having left lower quadrant abdominal pain when patient had diarrhea. On exam patient is nontender and no ecchymotic areas to suggest any obvious hematoma. Patient is abdominal pain-free at this time. Patient was started on prednisone and nebulizer treatment for bronchitis 4-5 days ago by patient's primary care physician. Patient is on aspirin but otherwise denies taking any NSAIDs.   Hospital Course by Problem:   Acute GI bleed -EGD: 2 Non-bleeding small clean based gastric ulcers  with no stigmata of bleeding. Gastritis. Biopsied. Erythematous duodenopathy -protonix BID then daily  HTN -PRNs  Chronic diastolic CHF -resume home meds       Medical Consultants:    GI   Discharge Exam:   Vitals:   08/07/16 1554 08/07/16 1605  BP: 105/64 104/64  Pulse: 89 95  Resp: (!) 21 (!) 22  Temp: 98 F (36.7 C)    Vitals:   08/07/16 1408 08/07/16  1429 08/07/16 1554 08/07/16 1605  BP:  (!) 113/52 105/64 104/64  Pulse:   89 95  Resp:  17 (!) 21 (!) 22  Temp:  98.1 F (36.7 C) 98 F (36.7 C)   TempSrc:  Oral Oral   SpO2: 98% 95% 95% 97%  Weight:      Height:        Gen:  NAD   The results of significant diagnostics from this hospitalization (including imaging, microbiology, ancillary and laboratory) are listed below for reference.     Procedures and Diagnostic Studies:   No results found.   Labs:   Basic Metabolic Panel:  Recent Labs Lab 08/06/16 1829 08/07/16 0240  NA 139 139  K 4.4 4.1  CL 105 109  CO2 21* 24  GLUCOSE 93 103*  BUN 23* 23*  CREATININE 1.19 1.17  CALCIUM 10.2 9.0   GFR Estimated Creatinine Clearance: 112.8 mL/min (by C-G formula based on SCr of 1.17 mg/dL). Liver Function Tests:  Recent Labs Lab 08/06/16 1829  AST 29  ALT 24  ALKPHOS 53  BILITOT 0.7  PROT 8.1  ALBUMIN 4.4   No results for input(s): LIPASE, AMYLASE in the last 168 hours. No results for input(s): AMMONIA in the last 168 hours. Coagulation profile No results for input(s): INR, PROTIME in the last 168 hours.  CBC:  Recent Labs Lab 08/06/16 1829 08/06/16 2335 08/07/16 0240 08/07/16 0700  WBC 9.8 7.6 7.7 7.3  HGB 14.2 12.6* 12.5* 12.7*  HCT 42.6 37.6* 38.5* 39.2  MCV 86.1 86.2 86.9 88.9  PLT 302 261 272 232   Cardiac Enzymes: No results for input(s): CKTOTAL, CKMB, CKMBINDEX, TROPONINI in the last 168 hours. BNP: Invalid input(s): POCBNP CBG: No results for input(s): GLUCAP in the last 168 hours. D-Dimer No results for input(s): DDIMER in the last 72 hours. Hgb A1c No results for input(s): HGBA1C in the last 72 hours. Lipid Profile No results for input(s): CHOL, HDL, LDLCALC, TRIG, CHOLHDL, LDLDIRECT in the last 72 hours. Thyroid function studies No results for input(s): TSH, T4TOTAL, T3FREE, THYROIDAB in the last 72 hours.  Invalid input(s): FREET3 Anemia work up No results for input(s):  VITAMINB12, FOLATE, FERRITIN, TIBC, IRON, RETICCTPCT in the last 72 hours. Microbiology No results found for this or any previous visit (from the past 240 hour(s)).   Discharge Instructions:   Discharge Instructions    Diet - low sodium heart healthy    Complete by:  As directed    Discharge instructions    Complete by:  As directed    protonix 40 mg BID x 1 month then daily   Increase activity slowly    Complete by:  As directed      Allergies as of 08/07/2016      Reactions   Demerol [meperidine] Anaphylaxis   Oxycodone Rash      Medication List    STOP taking these medications   ranitidine 150 MG tablet Commonly known as:  ZANTAC     TAKE these medications   albuterol 108 (90 Base) MCG/ACT inhaler Commonly known as:  PROVENTIL HFA;VENTOLIN HFA Inhale 2 puffs into the lungs every 6 (six) hours as needed for wheezing or shortness of breath.   amLODipine 5 MG tablet Commonly known as:  NORVASC Take 1 tablet (5 mg total) by mouth daily.   aspirin EC 81 MG tablet Take 1 tablet (81 mg total) by mouth daily.   benazepril 40 MG tablet Commonly known as:  LOTENSIN Take 1 tablet (40 mg total) by mouth daily.   furosemide 40 MG tablet Commonly known as:  LASIX Take 1.5 tablets (60 mg total) by mouth daily.   metoprolol succinate 50 MG 24 hr tablet Commonly known as:  TOPROL-XL Take 1 tablet (50 mg total) by mouth daily. Take with or immediately following a meal.   MUCINEX DM 30-600 MG Tb12 Take 1 tablet by mouth 2 (two) times daily as needed (CONGESTION).   nitroGLYCERIN 0.4 MG SL tablet Commonly known as:  NITROSTAT Place 1 tablet (0.4 mg total) under the tongue every 5 (five) minutes as needed for chest pain.   pantoprazole 40 MG tablet Commonly known as:  PROTONIX 40 mg BID x 30 days then daily   potassium chloride SA 20 MEQ tablet Commonly known as:  KLOR-CON M20 Take 1.5 tablets (30 mEq total) by mouth daily.   predniSONE 20 MG tablet Commonly known  as:  DELTASONE Take 1 tablet (20 mg total) by mouth daily with breakfast.   RICOLA Lozg Use as directed 1 lozenge in the mouth or throat daily as needed (SORE THROAT).      Follow-up Information    PICKARD,WARREN TOM, MD Follow up in 1 week(s).   Specialty:  Family Medicine Contact information: [address] [place] [address] [phone]            Time coordinating discharge: 35 min  Signed:  JESSICA Alison Stalling   [facility] 08/07/2016, 4:18 PM

## 2016-08-07 NOTE — H&P (View-Only) (Signed)
[facility] Consult: 10:06 AM 08/07/2016  LOS: 0 days    Referring Provider: Dr Eliseo Squires  Primary Care Physician:  Odette Fraction, MD Primary Gastroenterologist:  Dr. Ardis Hughs     Reason for Consultation:  Melenic stool.     HPI: [name] is a 51 y.o. male.  PMH Htn.  Grade 1 diastolic CHF, LVEF 60 to [id number].  Renal cell  Cancer.  TIA.  Normal coronaries on outpt cardiac cath 08/01/16 for sxs sugg of angina and abnormal nuc stress test.  08/2015 Colonoscopy for avg risk screening.  Dr Ardis Hughs.  Three, 3-5 mm sessile polyps (tubular adenomas) removed.  Otherwise normal study.   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  Dr Henrene Pastor found clean-based, antral ulcer and duodenal erosions. Path: Chronic active gastritis, with H Pylori present (pt recall it was treated).    A few days before 2/27 cardiac cath started having indigestion, pyrosis and started on Rantidine 150 mg at HS.  Had not been on PPI etc before this.  Still with lingering anorexia throughout the following week.  In afternoon 2/27, post cath, developed URI with sinus congestion, tight cough.  Went to MD on 3/1 and RXd with albuterol and prednisone taper.   That same day, 3/1, had watery, dark stool.  No stools on 3/2.  Melena with tarry stools on 3/3.  No stools since then but at work yesterday dizzy, diaphoretic, pale, tachy with minor exertion.  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.  Close friend convinced him to go to ED.  No stools since then.  Nausea on Sunday, no emesis.   Negative Fm Hx for ulcers, GI bleeding, GI cancers, liver disease.  No NSAIDs, ETOH, tobacco   Hgb 14.1 on 07/27/16.  Since admission Hgb : 14.2.. 12.6.. 12.5.. 12.7.  MCV normal.  BUN 23 with trend since 05/15/16: 22.. 21.. 19.. 14.. 23.. 23.  Creatinine  normal.   Past Medical History:  Diagnosis Date  . History of Doppler ultrasound    Carotid US 11/17: Stable 1-39% bilateral ICA stenosis; Normal subclavian arteries, bilaterally.  Marland Kitchen History of echocardiogram    Echo 11/17: EF 60-65, normal wall motion, grade 1 diastolic dysfunction, aortic sclerosis without stenosis, trivial MR, normal RVSF, moderate RAE  . Hyperlipidemia 02/21/14   diet controlled, no medications  . Left acetabular fracture ([place])    nonoperative around age 70  . Stroke ([place]) 02/21/14   TIA - no limitaions from stroke  . Ulcer ([place])    Hx - no longer a problem per patient    Past Surgical History:  Procedure Laterality Date  . COSMETIC SURGERY     Dermal tatoo removal face from accident  . KNEE ARTHROSCOPY W/ MENISCAL REPAIR     Both knees  . RIGHT/LEFT HEART CATH AND CORONARY ANGIOGRAPHY N/A 08/01/2016   Procedure: Right/Left Heart Cath and Coronary Angiography;  Surgeon: Sherren Mocha, MD;  Location: [facility];  Service: Cardiovascular;  Laterality: N/A;  . TONSILLECTOMY    . UPPER GASTROINTESTINAL ENDOSCOPY  hx ulcer, no longer a problem per patient  . WISDOM TOOTH EXTRACTION      Prior to Admission medications   Medication Sig Start Date End Date Taking? Authorizing Provider  albuterol (PROVENTIL HFA;VENTOLIN HFA) 108 (90 Base) MCG/ACT inhaler Inhale 2 puffs into the lungs every 6 (six) hours as needed for wheezing or shortness of breath. 08/03/16  Yes Mary B Dixon, PA-C  amLODipine (NORVASC) 5 MG tablet Take 1 tablet (5 mg total) by mouth daily. 05/30/16 08/28/16 Yes Scott T Kathlen Mody, PA-C  aspirin EC 81 MG tablet Take 1 tablet (81 mg total) by mouth daily. 04/11/16  Yes Scott T Weaver, PA-C  benazepril (LOTENSIN) 40 MG tablet Take 1 tablet (40 mg total) by mouth daily. 02/04/16  Yes Susy Frizzle, MD  Dextromethorphan-Guaifenesin [facility] DM) 30-600 MG TB12 Take 1 tablet by mouth 2 (two) times daily as needed (CONGESTION).   Yes Historical  Provider, MD  furosemide (LASIX) 40 MG tablet Take 1.5 tablets (60 mg total) by mouth daily. 06/01/16 08/30/16 Yes Scott T Weaver, PA-C  Menthol (RICOLA) LOZG Use as directed 1 lozenge in the mouth or throat daily as needed (SORE THROAT).   Yes Historical Provider, MD  metoprolol succinate (TOPROL-XL) 50 MG 24 hr tablet Take 1 tablet (50 mg total) by mouth daily. Take with or immediately following a meal. 02/04/16  Yes Susy Frizzle, MD  nitroGLYCERIN (NITROSTAT) 0.4 MG SL tablet Place 1 tablet (0.4 mg total) under the tongue every 5 (five) minutes as needed for chest pain. 07/14/16 10/12/16 Yes Josue Hector, MD  potassium chloride SA (KLOR-CON M20) 20 MEQ tablet Take 1.5 tablets (30 mEq total) by mouth daily. 06/01/16 08/30/16 Yes Scott T Kathlen Mody, PA-C  predniSONE (DELTASONE) 20 MG tablet Take 1 tablet (20 mg total) by mouth daily with breakfast. 08/03/16  Yes Orlena Sheldon, PA-C  ranitidine (ZANTAC) 150 MG tablet Take 150 mg by mouth at bedtime.    Yes Historical Provider, MD    Scheduled Meds: . albuterol  2.5 mg Nebulization TID  . budesonide (PULMICORT) nebulizer solution  0.25 mg Nebulization BID  . metoprolol  2.5 mg Intravenous Q6H  . [START ON 08/10/2016] pantoprazole  40 mg Intravenous Q12H   Infusions: . sodium chloride 10 mL/hr at 08/07/16 0132  . pantoprozole (PROTONIX) infusion 8 mg/hr (08/07/16 0132)   PRN Meds: acetaminophen **OR** acetaminophen, albuterol, hydrALAZINE, nitroGLYCERIN, ondansetron **OR** ondansetron (ZOFRAN) IV   Allergies as of 08/06/2016 - Review Complete 08/06/2016  Allergen Reaction Noted  . Demerol [meperidine] Anaphylaxis 07/09/2012  . Oxycodone Rash 07/09/2012    Family History  Problem Relation Age of Onset  . Hyperlipidemia Mother   . Hypertension Mother   . Heart disease Father   . Hyperlipidemia Father   . Hypertension Father   . Heart attack Father   . Heart disease Sister   . Hyperlipidemia Sister   . Hypertension Sister   . Hyperlipidemia  Brother   . Hypertension Brother   . Diabetes Brother   . Colon polyps Brother   . Cancer Maternal Grandmother   . Hearing loss Maternal Grandmother   . Hypertension Maternal Grandmother   . Esophageal cancer Maternal Grandmother   . Cancer Maternal Grandfather   . Colon cancer Maternal Grandfather   . Cancer Paternal Grandmother   . Heart disease Paternal Grandmother   . Hypertension Paternal Grandmother   . Cancer Paternal Grandfather   . Diabetes Brother   . Heart disease Brother   .  Hyperlipidemia Brother   . Hypertension Brother   . Colon cancer Maternal Uncle   . Rectal cancer Neg Hx   . Stomach cancer Neg Hx     Social History   Social History  . Marital status: Single    Spouse name: N/A  . Number of children: N/A  . Years of education: N/A   Occupational History  . housekeeping supervisor      at [employer]  . Smoking status: Former Smoker    Packs/day: 2.00    Types: Cigarettes    Start date: 07/09/1981    Quit date: 07/10/1999  . Smokeless tobacco: Never Used  . Alcohol use No  . Drug use: Yes    Types: Methamphetamines  . Sexual activity: No     Comment: gay but not sexually active since ~ 2008   Other Topics Concern  . Not on file   Social History Narrative   Previous Armed forces logistics/support/administrative officer.     REVIEW OF SYSTEMS: Constitutional:  Per HPI.  70 # weight loss over 4 years, through effort.  ENT:  No nose bleeds Pulm:  Per HPI CV:  No palpitations, no LE edema.  GU:  No hematuria, no frequency GI:  No dysphagia.  See HPI Heme:  No unusual bleeding or bruising.     Transfusions:  None ever Neuro:  No headaches, no peripheral tingling or numbness Derm:  No itching, no rash or sores.  GU:  Not sexually active.  Endocrine:  No sweats or chills.  No polyuria or dysuria Immunization:  Did not have flu shot this year.   Travel:  None beyond local counties in last few months.    PHYSICAL EXAM: Vital signs in last 24  hours: Vitals:   08/06/16 2226 08/07/16 0623  BP: (!) 123/59 (!) 107/59  Pulse: 90 81  Resp: 18 18  Temp: 98.2 F (36.8 C) 97.9 F (36.6 C)   Wt Readings from Last 3 Encounters:  08/06/16 (!) 149.7 kg (330 lb 1.6 oz)  08/03/16 (!) 153.5 kg (338 lb 6.4 oz)  08/01/16 (!) 152.9 kg (337 lb)   General: pleasant, overweight Head:  No assymetry or swelling  Eyes:  No pallor or icterus Ears:  not HOH  Nose:  Congestion without discharge Mouth:  Moist, clear oral MM.  Tongue midline.  Teeth in fair condition Neck:  No JVD, masses or TMG Lungs:  Clear bil.  No dyspnea.  Minor cough Heart: RRR.  No MRG.  S1, S2 present Abdomen:  Soft, obese, NT, ND.  Active BS.  No mass or HSM.  Minor tenderness in LLQ.   Rectal: deferred   Musc/Skeltl: no joint erythema, swelling or gross deformity Extremities:  No CCE  Neurologic:  Alert, oriented x 3.  No limb wekaness, no tremor Skin:  No telangectasia Tattoos:  None.   Nodes:  No cervical adenopathy   Psych:  Pleasant, in good spirits, calm, cooperative.   Intake/Output from previous day: 03/04 0701 - 03/05 0700 In: 44.7 [I.V.:44.7] Out: -  Intake/Output this shift: No intake/output data recorded.  LAB RESULTS:  Recent Labs  08/06/16 2335 08/07/16 0240 08/07/16 0700  WBC 7.6 7.7 7.3  HGB 12.6* 12.5* 12.7*  HCT 37.6* 38.5* 39.2  PLT 261 272 232   BMET Lab Results  Component Value Date   NA 139 08/07/2016   NA 139 08/06/2016   NA 139 07/27/2016   K 4.1 08/07/2016   K  4.4 08/06/2016   K 4.6 07/27/2016   CL 109 08/07/2016   CL 105 08/06/2016   CL 100 07/27/2016   CO2 24 08/07/2016   CO2 21 (L) 08/06/2016   CO2 24 07/27/2016   GLUCOSE 103 (H) 08/07/2016   GLUCOSE 93 08/06/2016   GLUCOSE 108 (H) 07/27/2016   BUN 23 (H) 08/07/2016   BUN 23 (H) 08/06/2016   BUN 15 07/27/2016   CREATININE 1.17 08/07/2016   CREATININE 1.19 08/06/2016   CREATININE 1.11 07/27/2016   CALCIUM 9.0 08/07/2016   CALCIUM 10.2 08/06/2016    CALCIUM 9.5 07/27/2016   LFT  Recent Labs  08/06/16 1829  PROT 8.1  ALBUMIN 4.4  AST 29  ALT 24  ALKPHOS 53  BILITOT 0.7   PT/INR Lab Results  Component Value Date   INR 1.0 07/27/2016   INR 1.07 02/19/2014   Hepatitis Panel No results for input(s): HEPBSAG, HCVAB, HEPAIGM, HEPBIGM in the last 72 hours. C-Diff No components found for: CDIFF Lipase  No results found for: LIPASE  Drugs of Abuse     Component Value Date/Time   LABOPIA NONE DETECTED 02/19/2014 1604   COCAINSCRNUR NONE DETECTED 02/19/2014 1604   LABBENZ NONE DETECTED 02/19/2014 1604   AMPHETMU NONE DETECTED 02/19/2014 1604   THCU NONE DETECTED 02/19/2014 1604   LABBARB NONE DETECTED 02/19/2014 1604     RADIOLOGY STUDIES: No results found.   IMPRESSION:   *  GI bleed.  Suspect UGI source.  ? Recurrent PUD.  2001 bleeding GU, duodenitis and tx for H Pylori.  Not on PPI etc for many years until last 10 days when started HS Ranitidine 150 mg.    *  Dropping blood counts, non-critical normocytic anemia.  Not in need of transfusion.    *  Recent URI, given prednisone taper, albuterol for this.     PLAN:     *  EGD, trying to arrange for today but schedule is tight.  NPO for now, though allowed hime a cup of Ice water at 10 AM today.    *  Does not really need PPI drip but since it is hanging, leave in place til after EGD.    *  CBC in AM.       Azucena Freed  08/07/2016, 10:06 AM Pager: [phone]

## 2016-08-07 NOTE — Anesthesia Postprocedure Evaluation (Signed)
Anesthesia Post Note  Patient: [name]  Procedure(s) Performed: Procedure(s) (LRB): ESOPHAGOGASTRODUODENOSCOPY (EGD) (N/A)  Anesthesia Type: MAC       Last Vitals:  Vitals:   08/07/16 1554 08/07/16 1605  BP: 105/64 104/64  Pulse: 89 95  Resp: (!) 21 (!) 22  Temp: 36.7 C     Last Pain:  Vitals:   08/07/16 1554  TempSrc: Oral  PainSc:                  Deren Degrazia COKER

## 2016-08-07 NOTE — Anesthesia Procedure Notes (Signed)
Procedure Name: MAC Date/Time: 08/07/2016 3:35 PM Performed by: Trixie Deis A Pre-anesthesia Checklist: Patient identified, Emergency Drugs available, Suction available, Patient being monitored and Timeout performed Patient Re-evaluated:Patient Re-evaluated prior to inductionOxygen Delivery Method: Nasal cannula Placement Confirmation: positive ETCO2 Dental Injury: Teeth and Oropharynx as per pre-operative assessment

## 2016-08-07 NOTE — Anesthesia Postprocedure Evaluation (Signed)
Anesthesia Post Note  Patient: [name]  Procedure(s) Performed: Procedure(s) (LRB): ESOPHAGOGASTRODUODENOSCOPY (EGD) (N/A)  Patient location during evaluation: Endoscopy Anesthesia Type: MAC Level of consciousness: awake, awake and alert and oriented Pain management: pain level controlled Vital Signs Assessment: post-procedure vital signs reviewed and stable Respiratory status: spontaneous breathing, nonlabored ventilation and respiratory function stable Cardiovascular status: blood pressure returned to baseline Anesthetic complications: no       Last Vitals:  Vitals:   08/07/16 1554 08/07/16 1605  BP: 105/64 104/64  Pulse: 89 95  Resp: (!) 21 (!) 22  Temp: 36.7 C     Last Pain:  Vitals:   08/07/16 1554  TempSrc: Oral  PainSc:                  [name]

## 2016-08-07 NOTE — Transfer of Care (Signed)
Immediate Anesthesia Transfer of Care Note  Patient: [name]  Procedure(s) Performed: Procedure(s): ESOPHAGOGASTRODUODENOSCOPY (EGD) (N/A)  Patient Location: Endoscopy Unit  Anesthesia Type:MAC  Level of Consciousness: sedated  Airway & Oxygen Therapy: Patient Spontanous Breathing and Patient connected to nasal cannula oxygen  Post-op Assessment: Report given to RN and Post -op Vital signs reviewed and stable  Post vital signs: Reviewed and stable  Last Vitals:  Vitals:   08/07/16 1224 08/07/16 1429  BP: 111/63 (!) 113/52  Pulse: 79   Resp:  17  Temp:  36.7 C    Last Pain:  Vitals:   08/07/16 1429  TempSrc: Oral  PainSc:       Patients Stated Pain Goal: 2 (63/87/56 4332)  Complications: No apparent anesthesia complications

## 2016-08-07 NOTE — Anesthesia Preprocedure Evaluation (Addendum)
Anesthesia Evaluation  Patient identified by MRN, date of birth, ID band Patient awake    Reviewed: Allergy & Precautions, NPO status , Patient's Chart, lab work & pertinent test results  Airway Mallampati: II  TM Distance: >3 FB Neck ROM: Full    Dental  (+) Teeth Intact, Dental Advisory Given   Pulmonary former smoker,    + rhonchi  + wheezing      Cardiovascular  Rhythm:Regular     Neuro/Psych    GI/Hepatic   Endo/Other    Renal/GU      Musculoskeletal   Abdominal (+) + obese,   Peds  Hematology   Anesthesia Other Findings   Reproductive/Obstetrics                             Anesthesia Physical Anesthesia Plan  ASA: III  Anesthesia Plan: MAC   Post-op Pain Management:    Induction: Intravenous  Airway Management Planned: Nasal Cannula and Natural Airway  Additional Equipment:   Intra-op Plan:   Post-operative Plan:   Informed Consent:   Dental advisory given  Plan Discussed with: CRNA and Anesthesiologist  Anesthesia Plan Comments:         Anesthesia Quick Evaluation

## 2016-08-07 NOTE — Progress Notes (Signed)
[name] to be D/C'd to home per MD order.  Discussed with the patient and all questions fully answered.  VSS, Skin clean, dry and intact without evidence of skin break down, no evidence of skin tears noted. IV catheter discontinued intact. Site without signs and symptoms of complications. Dressing and pressure applied.  An After Visit Summary was printed and given to the patient. Patient received prescription and note for work.  D/c education completed with patient/family including follow up instructions, medication list, d/c activities limitations if indicated, with other d/c instructions as indicated by MD - patient able to verbalize understanding, all questions fully answered.   Patient instructed to return to ED, call 911, or call MD for any changes in condition.   Patient escorted via [place], and D/C home via private auto.  Morley Kos Price 08/07/2016 6:30 PM

## 2016-08-07 NOTE — Interval H&P Note (Signed)
History and Physical Interval Note:  08/07/2016 3:15 PM  [name]  has presented today for surgery, with the diagnosis of GI bleed  The various methods of treatment have been discussed with the patient and family. After consideration of risks, benefits and other options for treatment, the patient has consented to  Procedure(s): ESOPHAGOGASTRODUODENOSCOPY (EGD) (N/A) as a surgical intervention .  The patient's history has been reviewed, patient examined, no change in status, stable for surgery.  I have reviewed the patient's chart and labs.  Questions were answered to the patient's satisfaction.     Renelda Loma [name]

## 2016-08-07 NOTE — Consult Note (Signed)
[facility] Consult: 10:06 AM 08/07/2016  LOS: 0 days    Referring Provider: Dr Eliseo Squires  Primary Care Physician:  Odette Fraction, MD Primary Gastroenterologist:  Dr. Ardis Hughs     Reason for Consultation:  Melenic stool.     HPI: [name] is a 51 y.o. male.  PMH Htn.  Grade 1 diastolic CHF, LVEF 60 to [id number].  Renal cell  Cancer.  TIA.  Normal coronaries on outpt cardiac cath 08/01/16 for sxs sugg of angina and abnormal nuc stress test.  08/2015 Colonoscopy for avg risk screening.  Dr Ardis Hughs.  Three, 3-5 mm sessile polyps (tubular adenomas) removed.  Otherwise normal study.   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  Dr Henrene Pastor found clean-based, antral ulcer and duodenal erosions. Path: Chronic active gastritis, with H Pylori present (pt recall it was treated).    A few days before 2/27 cardiac cath started having indigestion, pyrosis and started on Rantidine 150 mg at HS.  Had not been on PPI etc before this.  Still with lingering anorexia throughout the following week.  In afternoon 2/27, post cath, developed URI with sinus congestion, tight cough.  Went to MD on 3/1 and RXd with albuterol and prednisone taper.   That same day, 3/1, had watery, dark stool.  No stools on 3/2.  Melena with tarry stools on 3/3.  No stools since then but at work yesterday dizzy, diaphoretic, pale, tachy with minor exertion.  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.  Close friend convinced him to go to ED.  No stools since then.  Nausea on Sunday, no emesis.   Negative Fm Hx for ulcers, GI bleeding, GI cancers, liver disease.  No NSAIDs, ETOH, tobacco   Hgb 14.1 on 07/27/16.  Since admission Hgb : 14.2.. 12.6.. 12.5.. 12.7.  MCV normal.  BUN 23 with trend since 05/15/16: 22.. 21.. 19.. 14.. 23.. 23.  Creatinine  normal.   Past Medical History:  Diagnosis Date  . History of Doppler ultrasound    Carotid US 11/17: Stable 1-39% bilateral ICA stenosis; Normal subclavian arteries, bilaterally.  Marland Kitchen History of echocardiogram    Echo 11/17: EF 60-65, normal wall motion, grade 1 diastolic dysfunction, aortic sclerosis without stenosis, trivial MR, normal RVSF, moderate RAE  . Hyperlipidemia 02/21/14   diet controlled, no medications  . Left acetabular fracture ([place])    nonoperative around age 27  . Stroke ([place]) 02/21/14   TIA - no limitaions from stroke  . Ulcer ([place])    Hx - no longer a problem per patient    Past Surgical History:  Procedure Laterality Date  . COSMETIC SURGERY     Dermal tatoo removal face from accident  . KNEE ARTHROSCOPY W/ MENISCAL REPAIR     Both knees  . RIGHT/LEFT HEART CATH AND CORONARY ANGIOGRAPHY N/A 08/01/2016   Procedure: Right/Left Heart Cath and Coronary Angiography;  Surgeon: Sherren Mocha, MD;  Location: [facility];  Service: Cardiovascular;  Laterality: N/A;  . TONSILLECTOMY    . UPPER GASTROINTESTINAL ENDOSCOPY  hx ulcer, no longer a problem per patient  . WISDOM TOOTH EXTRACTION      Prior to Admission medications   Medication Sig Start Date End Date Taking? Authorizing Provider  albuterol (PROVENTIL HFA;VENTOLIN HFA) 108 (90 Base) MCG/ACT inhaler Inhale 2 puffs into the lungs every 6 (six) hours as needed for wheezing or shortness of breath. 08/03/16  Yes Mary B Dixon, PA-C  amLODipine (NORVASC) 5 MG tablet Take 1 tablet (5 mg total) by mouth daily. 05/30/16 08/28/16 Yes Scott T Kathlen Mody, PA-C  aspirin EC 81 MG tablet Take 1 tablet (81 mg total) by mouth daily. 04/11/16  Yes Scott T Weaver, PA-C  benazepril (LOTENSIN) 40 MG tablet Take 1 tablet (40 mg total) by mouth daily. 02/04/16  Yes Susy Frizzle, MD  Dextromethorphan-Guaifenesin [facility] DM) 30-600 MG TB12 Take 1 tablet by mouth 2 (two) times daily as needed (CONGESTION).   Yes Historical  Provider, MD  furosemide (LASIX) 40 MG tablet Take 1.5 tablets (60 mg total) by mouth daily. 06/01/16 08/30/16 Yes Scott T Weaver, PA-C  Menthol (RICOLA) LOZG Use as directed 1 lozenge in the mouth or throat daily as needed (SORE THROAT).   Yes Historical Provider, MD  metoprolol succinate (TOPROL-XL) 50 MG 24 hr tablet Take 1 tablet (50 mg total) by mouth daily. Take with or immediately following a meal. 02/04/16  Yes Susy Frizzle, MD  nitroGLYCERIN (NITROSTAT) 0.4 MG SL tablet Place 1 tablet (0.4 mg total) under the tongue every 5 (five) minutes as needed for chest pain. 07/14/16 10/12/16 Yes Josue Hector, MD  potassium chloride SA (KLOR-CON M20) 20 MEQ tablet Take 1.5 tablets (30 mEq total) by mouth daily. 06/01/16 08/30/16 Yes Scott T Kathlen Mody, PA-C  predniSONE (DELTASONE) 20 MG tablet Take 1 tablet (20 mg total) by mouth daily with breakfast. 08/03/16  Yes Orlena Sheldon, PA-C  ranitidine (ZANTAC) 150 MG tablet Take 150 mg by mouth at bedtime.    Yes Historical Provider, MD    Scheduled Meds: . albuterol  2.5 mg Nebulization TID  . budesonide (PULMICORT) nebulizer solution  0.25 mg Nebulization BID  . metoprolol  2.5 mg Intravenous Q6H  . [START ON 08/10/2016] pantoprazole  40 mg Intravenous Q12H   Infusions: . sodium chloride 10 mL/hr at 08/07/16 0132  . pantoprozole (PROTONIX) infusion 8 mg/hr (08/07/16 0132)   PRN Meds: acetaminophen **OR** acetaminophen, albuterol, hydrALAZINE, nitroGLYCERIN, ondansetron **OR** ondansetron (ZOFRAN) IV   Allergies as of 08/06/2016 - Review Complete 08/06/2016  Allergen Reaction Noted  . Demerol [meperidine] Anaphylaxis 07/09/2012  . Oxycodone Rash 07/09/2012    Family History  Problem Relation Age of Onset  . Hyperlipidemia Mother   . Hypertension Mother   . Heart disease Father   . Hyperlipidemia Father   . Hypertension Father   . Heart attack Father   . Heart disease Sister   . Hyperlipidemia Sister   . Hypertension Sister   . Hyperlipidemia  Brother   . Hypertension Brother   . Diabetes Brother   . Colon polyps Brother   . Cancer Maternal Grandmother   . Hearing loss Maternal Grandmother   . Hypertension Maternal Grandmother   . Esophageal cancer Maternal Grandmother   . Cancer Maternal Grandfather   . Colon cancer Maternal Grandfather   . Cancer Paternal Grandmother   . Heart disease Paternal Grandmother   . Hypertension Paternal Grandmother   . Cancer Paternal Grandfather   . Diabetes Brother   . Heart disease Brother   .  Hyperlipidemia Brother   . Hypertension Brother   . Colon cancer Maternal Uncle   . Rectal cancer Neg Hx   . Stomach cancer Neg Hx     Social History   Social History  . Marital status: Single    Spouse name: N/A  . Number of children: N/A  . Years of education: N/A   Occupational History  . housekeeping supervisor      at [employer]  . Smoking status: Former Smoker    Packs/day: 2.00    Types: Cigarettes    Start date: 07/09/1981    Quit date: 07/10/1999  . Smokeless tobacco: Never Used  . Alcohol use No  . Drug use: Yes    Types: Methamphetamines  . Sexual activity: No     Comment: gay but not sexually active since ~ 2008   Other Topics Concern  . Not on file   Social History Narrative   Previous Armed forces logistics/support/administrative officer.     REVIEW OF SYSTEMS: Constitutional:  Per HPI.  70 # weight loss over 4 years, through effort.  ENT:  No nose bleeds Pulm:  Per HPI CV:  No palpitations, no LE edema.  GU:  No hematuria, no frequency GI:  No dysphagia.  See HPI Heme:  No unusual bleeding or bruising.     Transfusions:  None ever Neuro:  No headaches, no peripheral tingling or numbness Derm:  No itching, no rash or sores.  GU:  Not sexually active.  Endocrine:  No sweats or chills.  No polyuria or dysuria Immunization:  Did not have flu shot this year.   Travel:  None beyond local counties in last few months.    PHYSICAL EXAM: Vital signs in last 24  hours: Vitals:   08/06/16 2226 08/07/16 0623  BP: (!) 123/59 (!) 107/59  Pulse: 90 81  Resp: 18 18  Temp: 98.2 F (36.8 C) 97.9 F (36.6 C)   Wt Readings from Last 3 Encounters:  08/06/16 (!) 149.7 kg (330 lb 1.6 oz)  08/03/16 (!) 153.5 kg (338 lb 6.4 oz)  08/01/16 (!) 152.9 kg (337 lb)   General: pleasant, overweight Head:  No assymetry or swelling  Eyes:  No pallor or icterus Ears:  not HOH  Nose:  Congestion without discharge Mouth:  Moist, clear oral MM.  Tongue midline.  Teeth in fair condition Neck:  No JVD, masses or TMG Lungs:  Clear bil.  No dyspnea.  Minor cough Heart: RRR.  No MRG.  S1, S2 present Abdomen:  Soft, obese, NT, ND.  Active BS.  No mass or HSM.  Minor tenderness in LLQ.   Rectal: deferred   Musc/Skeltl: no joint erythema, swelling or gross deformity Extremities:  No CCE  Neurologic:  Alert, oriented x 3.  No limb wekaness, no tremor Skin:  No telangectasia Tattoos:  None.   Nodes:  No cervical adenopathy   Psych:  Pleasant, in good spirits, calm, cooperative.   Intake/Output from previous day: 03/04 0701 - 03/05 0700 In: 44.7 [I.V.:44.7] Out: -  Intake/Output this shift: No intake/output data recorded.  LAB RESULTS:  Recent Labs  08/06/16 2335 08/07/16 0240 08/07/16 0700  WBC 7.6 7.7 7.3  HGB 12.6* 12.5* 12.7*  HCT 37.6* 38.5* 39.2  PLT 261 272 232   BMET Lab Results  Component Value Date   NA 139 08/07/2016   NA 139 08/06/2016   NA 139 07/27/2016   K 4.1 08/07/2016   K  4.4 08/06/2016   K 4.6 07/27/2016   CL 109 08/07/2016   CL 105 08/06/2016   CL 100 07/27/2016   CO2 24 08/07/2016   CO2 21 (L) 08/06/2016   CO2 24 07/27/2016   GLUCOSE 103 (H) 08/07/2016   GLUCOSE 93 08/06/2016   GLUCOSE 108 (H) 07/27/2016   BUN 23 (H) 08/07/2016   BUN 23 (H) 08/06/2016   BUN 15 07/27/2016   CREATININE 1.17 08/07/2016   CREATININE 1.19 08/06/2016   CREATININE 1.11 07/27/2016   CALCIUM 9.0 08/07/2016   CALCIUM 10.2 08/06/2016    CALCIUM 9.5 07/27/2016   LFT  Recent Labs  08/06/16 1829  PROT 8.1  ALBUMIN 4.4  AST 29  ALT 24  ALKPHOS 53  BILITOT 0.7   PT/INR Lab Results  Component Value Date   INR 1.0 07/27/2016   INR 1.07 02/19/2014   Hepatitis Panel No results for input(s): HEPBSAG, HCVAB, HEPAIGM, HEPBIGM in the last 72 hours. C-Diff No components found for: CDIFF Lipase  No results found for: LIPASE  Drugs of Abuse     Component Value Date/Time   LABOPIA NONE DETECTED 02/19/2014 1604   COCAINSCRNUR NONE DETECTED 02/19/2014 1604   LABBENZ NONE DETECTED 02/19/2014 1604   AMPHETMU NONE DETECTED 02/19/2014 1604   THCU NONE DETECTED 02/19/2014 1604   LABBARB NONE DETECTED 02/19/2014 1604     RADIOLOGY STUDIES: No results found.   IMPRESSION:   *  GI bleed.  Suspect UGI source.  ? Recurrent PUD.  2001 bleeding GU, duodenitis and tx for H Pylori.  Not on PPI etc for many years until last 10 days when started HS Ranitidine 150 mg.    *  Dropping blood counts, non-critical normocytic anemia.  Not in need of transfusion.    *  Recent URI, given prednisone taper, albuterol for this.     PLAN:     *  EGD, trying to arrange for today but schedule is tight.  NPO for now, though allowed hime a cup of Ice water at 10 AM today.    *  Does not really need PPI drip but since it is hanging, leave in place til after EGD.    *  CBC in AM.       Azucena Freed  08/07/2016, 10:06 AM Pager: [phone]

## 2016-08-07 NOTE — Progress Notes (Signed)
PROGRESS NOTE    Diovanni Hoofnagle  [id number] DOB: January 31, 1966 DOA: 08/06/2016 PCP: Odette Fraction, MD   Outpatient Specialists:     Brief Narrative:  Delvecchio Wolbers is a 51 y.o. male with history of hypertension, diastolic CHF, TIA and history of renal cell carcinoma presents to the ER because of melanotic stool. Patient started experiencing diarrhea with dark stools 3 days ago and last 2 days has been having melanotic stool. Patient also had a cardiac cath on February 27 last week which was showing normal coronaries. Patient has been having left lower quadrant abdominal pain when patient had diarrhea. On exam patient is nontender and no ecchymotic areas to suggest any obvious hematoma. Patient is abdominal pain-free at this time. Patient was started on prednisone and nebulizer treatment for bronchitis 4-5 days ago by patient's primary care physician. Patient is on aspirin but otherwise denies taking any NSAIDs.  ED Course: In the ER rectal exam showed melanotic stool. Hemoglobin was around 14. Patient was hemodynamically stable. ER physician had discussed with him about gastroenterologist who is planning to do endoscopy in the morning and had requested patient to be placed nothing by mouth and on Protonix infusion.   Assessment & Plan:   Principal Problem:   Acute GI bleeding Active Problems:   Chronic diastolic CHF (congestive heart failure) (HCC)   Essential hypertension   Acute GI bleed -h/h stable -protonix -for EGD today  HTN -PRNs  Chronic diastolic CHF -holding lasix  H/o TIA -hold ASA for now   DVT prophylaxis:  SCD's  Code Status: Full Code   Family Communication:  Disposition Plan:     Consultants:  GI  Subjective: Some LLQ pain  Objective: Vitals:   08/07/16 0509 08/07/16 0623 08/07/16 0842 08/07/16 1224  BP:  (!) 107/59  111/63  Pulse:  81  79  Resp:  18    Temp:  97.9 F (36.6 C)    TempSrc:  Oral    SpO2: 95% 93% 99%   Weight:        Height:        Intake/Output Summary (Last 24 hours) at 08/07/16 1234 Last data filed at 08/07/16 0654  Gross per 24 hour  Intake            44.67 ml  Output                0 ml  Net            44.67 ml   Filed Weights   08/06/16 1601 08/06/16 2131  Weight: (!) 153.3 kg (338 lb) (!) 149.7 kg (330 lb 1.6 oz)    Examination:  General exam: Appears calm and comfortable  Respiratory system: Clear to auscultation. Respiratory effort normal. Cardiovascular system: S1 & S2 heard, RRR. No JVD, murmurs, rubs, gallops or clicks. No pedal edema. Gastrointestinal system: Abdomen is nondistended, soft and nontender. No organomegaly or masses felt. Normal bowel sounds heard. Central nervous system: Alert and oriented. No focal neurological deficits.      Data Reviewed: I have personally reviewed following labs and imaging studies  CBC:  Recent Labs Lab 08/06/16 1829 08/06/16 2335 08/07/16 0240 08/07/16 0700  WBC 9.8 7.6 7.7 7.3  HGB 14.2 12.6* 12.5* 12.7*  HCT 42.6 37.6* 38.5* 39.2  MCV 86.1 86.2 86.9 88.9  PLT 302 261 272 [id number]   Basic Metabolic Panel:  Recent Labs Lab 08/06/16 1829 08/07/16 0240  NA 139 139  K 4.4 4.1  CL 105 109  CO2 21* 24  GLUCOSE 93 103*  BUN 23* 23*  CREATININE 1.19 1.17  CALCIUM 10.2 9.0   GFR: Estimated Creatinine Clearance: 112.8 mL/min (by C-G formula based on SCr of 1.17 mg/dL). Liver Function Tests:  Recent Labs Lab 08/06/16 1829  AST 29  ALT 24  ALKPHOS 53  BILITOT 0.7  PROT 8.1  ALBUMIN 4.4   No results for input(s): LIPASE, AMYLASE in the last 168 hours. No results for input(s): AMMONIA in the last 168 hours. Coagulation Profile: No results for input(s): INR, PROTIME in the last 168 hours. Cardiac Enzymes: No results for input(s): CKTOTAL, CKMB, CKMBINDEX, TROPONINI in the last 168 hours. BNP (last 3 results) No results for input(s): PROBNP in the last 8760 hours. HbA1C: No results for input(s): HGBA1C in the last 72  hours. CBG: No results for input(s): GLUCAP in the last 168 hours. Lipid Profile: No results for input(s): CHOL, HDL, LDLCALC, TRIG, CHOLHDL, LDLDIRECT in the last 72 hours. Thyroid Function Tests: No results for input(s): TSH, T4TOTAL, FREET4, T3FREE, THYROIDAB in the last 72 hours. Anemia Panel: No results for input(s): VITAMINB12, FOLATE, FERRITIN, TIBC, IRON, RETICCTPCT in the last 72 hours. Urine analysis:    Component Value Date/Time   COLORURINE AMBER (A) 04/07/2014 1540   APPEARANCEUR CLOUDY (A) 04/07/2014 1540   LABSPEC 1.025 04/07/2014 1540   PHURINE 5.5 04/07/2014 1540   GLUCOSEU NEG 04/07/2014 1540   HGBUR LARGE (A) 04/07/2014 1540   BILIRUBINUR negative 11/19/2015 1239   BILIRUBINUR neg 07/09/2012 1740   KETONESUR trace (5) (A) 11/19/2015 1239   KETONESUR 15 (A) 04/07/2014 1540   PROTEINUR trace (A) 11/19/2015 1239   PROTEINUR NEG 04/07/2014 1540   UROBILINOGEN 2.0 11/19/2015 1239   UROBILINOGEN 0.2 04/07/2014 1540   NITRITE Negative 11/19/2015 1239   NITRITE NEG 04/07/2014 1540   LEUKOCYTESUR Negative 11/19/2015 1239     )No results found for this or any previous visit (from the past 240 hour(s)).    Anti-infectives    None       Radiology Studies: No results found.      Scheduled Meds: . albuterol  2.5 mg Nebulization TID  . budesonide (PULMICORT) nebulizer solution  0.25 mg Nebulization BID  . metoprolol  2.5 mg Intravenous Q6H   Continuous Infusions: . sodium chloride 10 mL/hr at 08/07/16 0132  . pantoprozole (PROTONIX) infusion 8 mg/hr (08/07/16 0132)     LOS: 0 days    Time spent: 25 min    Nikolski, DO [facility] Pager [phone]  If 7PM-7AM, please contact night-coverage [email or web address] Password [facility] 08/07/2016, 12:34 PM

## 2016-08-11 ENCOUNTER — Encounter: Payer: Self-pay | Admitting: Gastroenterology

## 2016-08-17 ENCOUNTER — Ambulatory Visit (INDEPENDENT_AMBULATORY_CARE_PROVIDER_SITE_OTHER): Payer: Commercial Managed Care - PPO | Admitting: Family Medicine

## 2016-08-17 ENCOUNTER — Encounter: Payer: Self-pay | Admitting: Family Medicine

## 2016-08-17 VITALS — BP 110/64 | HR 76 | Temp 98.2°F | Resp 16 | Ht 74.0 in | Wt 323.0 lb

## 2016-08-17 DIAGNOSIS — I5032 Chronic diastolic (congestive) heart failure: Secondary | ICD-10-CM

## 2016-08-17 DIAGNOSIS — Z09 Encounter for follow-up examination after completed treatment for conditions other than malignant neoplasm: Secondary | ICD-10-CM | POA: Diagnosis not present

## 2016-08-17 DIAGNOSIS — I10 Essential (primary) hypertension: Secondary | ICD-10-CM

## 2016-08-17 DIAGNOSIS — K254 Chronic or unspecified gastric ulcer with hemorrhage: Secondary | ICD-10-CM | POA: Diagnosis not present

## 2016-08-17 NOTE — Progress Notes (Signed)
Subjective:    Patient ID: [name], male    DOB: 10/25/1965, 51 y.o.   MRN: [id number]  HPI  12/30/15 Over the last 2 weeks, the patient has been seen progressively higher blood pressures. His systolic blood pressures have been averaging between 150 and 200. His diastolic blood pressures have been approaching 110. He also reports a dull headache. He has a history of a TIA. He is not taking any aspirin. In the past he tried losartan hydrochlorothiazide with side effects.  At that time, my plan was: Blood pressure certainly elevated. I would like the patient to start amlodipine 10 mg daily and recheck his blood pressure here in one week.  01/20/16 Blood pressure remained very high and benazepril 20 mg a day was added to amlodipine. His blood pressure has improved very minimally. His blood pressure at home seems to be ranging between 140 and 160/90-100. I checked the blood pressure in his right arm and found to be 150/100. I checked the blood pressure in his left arm and found to be 148/90. However more concerning week, the patient is now endorsing chest pressure with exercise. He reports a squeezing sensation in his chest when he is walking up and down steps. With rest the chest pressure goes away. He also reports more shortness of breath with activity and occasionally pain radiates into his left arm with activity. The symptoms do not occur at rest. All this is concerning for angina.   At that time, my plan was: Patient's history is very concerning for angina. Fortunately it is stable angina. I have recommended cardiology consultation for stress test and echocardiogram to evaluate for systolic and diastolic failure. I will perform an EKG today.  I will risk stratify the patient with a CMP and a fasting lipid panel. While checking lab work I will obtain a PSA for screening purposes. Primary concern is to reduce his blood pressure. I will increase benazepril to 40 mg a day and add Toprol-XL 50 mg a day mg  a day.  continue aspirin.    08/17/16 Since last time I saw the patient, he had an echocardiogram to confirm diastolic congestive heart failure. His cardiologist is managing this with a combination of metoprolol for rate control, Lasix for diuresis. He is also started on benazepril. I had the patient on amlodipine. The combination of these medications make the patient very orthostatic and dizzy. His weight has remained relatively stable between 322 and 328 lbs.  however he continues to complain of orthostatic dizziness. His blood pressures have been averaging 110-120/60 indicating that he may be overmedicated. Patient had a catheterization that confirmed normal coronary arteries however shortly after having catheterization, the patient developed bleeding stomach ulcer with melena. He was recently hospitalized for this and discharged on March 5. EGD confirmed this although it was already trying to heal. GI recommended protonix 40 mg by mouth twice a day for 30 days and then 40 mg a day thereafter. They also The patient on aspirin given his history of TIA. He denies any melena since discharge from the hospital.  However he continues to complain of nausea brought on by fatty meals. He denies any abdominal pain. He denies any vomiting Past Medical History:  Diagnosis Date  . History of Doppler ultrasound    Carotid US 11/17: Stable 1-39% bilateral ICA stenosis; Normal subclavian arteries, bilaterally.  Marland Kitchen History of echocardiogram    Echo 11/17: EF 60-65, normal wall motion, grade 1 diastolic dysfunction, aortic sclerosis without  stenosis, trivial MR, normal RVSF, moderate RAE  . Hyperlipidemia 02/21/14   diet controlled, no medications  . Left acetabular fracture ([place])    nonoperative around age 56  . Stroke ([place]) 02/21/14   TIA - no limitaions from stroke  . Ulcer ([place])    Hx - no longer a problem per patient   Past Surgical History:  Procedure Laterality Date  . COSMETIC SURGERY     Dermal tatoo  removal face from accident  . ESOPHAGOGASTRODUODENOSCOPY N/A 08/07/2016   Procedure: ESOPHAGOGASTRODUODENOSCOPY (EGD);  Surgeon: Manus Gunning, MD;  Location: [facility];  Service: Gastroenterology;  Laterality: N/A;  . KNEE ARTHROSCOPY W/ MENISCAL REPAIR     Both knees  . RIGHT/LEFT HEART CATH AND CORONARY ANGIOGRAPHY N/A 08/01/2016   Procedure: Right/Left Heart Cath and Coronary Angiography;  Surgeon: Sherren Mocha, MD;  Location: [facility];  Service: Cardiovascular;  Laterality: N/A;  . TONSILLECTOMY    . UPPER GASTROINTESTINAL ENDOSCOPY     hx ulcer, no longer a problem per patient  . WISDOM TOOTH EXTRACTION     Current Outpatient Prescriptions on File Prior to Visit  Medication Sig Dispense Refill  . amLODipine (NORVASC) 5 MG tablet Take 1 tablet (5 mg total) by mouth daily. 90 tablet 3  . aspirin EC 81 MG tablet Take 1 tablet (81 mg total) by mouth daily. 90 tablet 3  . benazepril (LOTENSIN) 40 MG tablet Take 1 tablet (40 mg total) by mouth daily. 90 tablet 3  . furosemide (LASIX) 40 MG tablet Take 1.5 tablets (60 mg total) by mouth daily. 135 tablet 3  . metoprolol succinate (TOPROL-XL) 50 MG 24 hr tablet Take 1 tablet (50 mg total) by mouth daily. Take with or immediately following a meal. 90 tablet 3  . nitroGLYCERIN (NITROSTAT) 0.4 MG SL tablet Place 1 tablet (0.4 mg total) under the tongue every 5 (five) minutes as needed for chest pain. 25 tablet 1  . pantoprazole (PROTONIX) 40 MG tablet 40 mg BID x 30 days then daily 90 tablet 0  . potassium chloride SA (KLOR-CON M20) 20 MEQ tablet Take 1.5 tablets (30 mEq total) by mouth daily. 135 tablet 3   No current facility-administered medications on file prior to visit.    Allergies  Allergen Reactions  . Demerol [Meperidine] Anaphylaxis  . Oxycodone Rash   Social History   Social History  . Marital status: Single    Spouse name: N/A  . Number of children: N/A  . Years of education: N/A   Occupational  History  . 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 Smoking status: Former Smoker    Packs/day: 2.00    Types: Cigarettes    Start date: 07/09/1981    Quit date: 07/10/1999  . Smokeless tobacco: Never Used  . Alcohol use No  . Drug use: Yes    Types: Methamphetamines  . Sexual activity: No     Comment: gay but not sexually active since ~ 2008   Other Topics Concern  . Not on file   Social History Narrative   Previous Armed forces logistics/support/administrative officer.        Review of Systems  All other systems reviewed and are negative.      Objective:   Physical Exam  Constitutional: He appears well-developed and well-nourished.  Cardiovascular: Normal rate, regular rhythm and normal heart sounds.   No murmur heard. Pulmonary/Chest: Breath sounds normal. No respiratory distress.  He has no wheezes. He has no rales. He exhibits no tenderness.  Abdominal: Soft. Bowel sounds are normal.  Musculoskeletal: He exhibits no edema.  Vitals reviewed.         Assessment & Plan:   Hospital discharge follow-up - Plan: CBC with Differential/Platelet, COMPLETE METABOLIC PANEL WITH GFR  Benign essential HTN  Gastric ulcer with hemorrhage, unspecified chronicity  Chronic diastolic congestive heart failure (HCC)  Given the fact the patient is on aspirin, I will repeat a CBC to assure that he is not losing any additional light from a stomach ulcer. I believe the dizziness and orthostasis is likely due to hypotension and I will discontinue amlodipine. Because of his diastolic heart failure, I will try to keep the patient on metoprolol and Lasix if at all possible. However I see no benefit to amlodipine at the present time. I believe the patient's nauseousness is likely due to his ulcer however if it continues consider biliary dyskinesia. At the present time he has no desire for Zofran or Phenergan.

## 2016-08-21 ENCOUNTER — Other Ambulatory Visit: Payer: Commercial Managed Care - PPO

## 2016-08-21 ENCOUNTER — Encounter: Payer: Self-pay | Admitting: Family Medicine

## 2016-08-21 LAB — COMPLETE METABOLIC PANEL WITH GFR
ALBUMIN: 3.9 g/dL (ref 3.6–5.1)
ALT: 13 U/L (ref 9–46)
AST: 17 U/L (ref 10–35)
Alkaline Phosphatase: 39 U/L — ABNORMAL LOW (ref 40–115)
BUN: 17 mg/dL (ref 7–25)
CHLORIDE: 104 mmol/L (ref 98–110)
CO2: 25 mmol/L (ref 20–31)
Calcium: 9.1 mg/dL (ref 8.6–10.3)
Creat: 0.96 mg/dL (ref 0.70–1.33)
GFR, Est African American: >89 mL/min (ref >=60)
GLUCOSE: 98 mg/dL (ref 70–99)
POTASSIUM: 4.2 mmol/L (ref 3.5–5.3)
SODIUM: 138 mmol/L (ref 135–146)
Total Bilirubin: 0.4 mg/dL (ref 0.2–1.2)
Total Protein: 6.4 g/dL (ref 6.1–8.1)

## 2016-08-24 LAB — OCCULT BLOOD, POC DEVICE: Fecal Occult Bld: POSITIVE — AB

## 2016-08-25 LAB — CBC WITH DIFFERENTIAL/PLATELET

## 2016-08-25 NOTE — Progress Notes (Signed)
Cardiology Office Note:    Date:  08/28/2016   ID:  Vania Rea, DOB 28-Apr-1966, MRN [id number]  PCP:  Odette Fraction, MD  Cardiologist:  Dr. Jenkins Rouge   Electrophysiologist:  Dr. Virl Axe   Referring MD: Susy Frizzle, MD   Chief Complaint  Patient presents with  . Congestive Heart Failure    History of Present Illness:    [name] is a 51 y.o. male with a hx of HTN, renal CA, prior TIA.  I have not seen him since 2015  LHC in 2000 was reportedly normal.  He was seen by me in 2015 and referred to EP for consideration of ILR for his TIA to r/o PAF .  The patient declined monitoring at that time.  He was seen by his PCP in 8/17 for chest pain.  FU with Cardiology was recommended. Seen by PA 05/03/16    His symptoms were suggestive of angina. However, he was volume overloaded and had similar symptoms prior to a normal cardiac catheterization in 2000. At that time, he responded to diuresis. Placed him on Lasix and set him up for an echocardiogram.  Echocardiogram was done 05/02/16 and demonstrated normal LV function with mild diastolic dysfunction and moderate right atrial enlargement.    Seen by me 07/14/16 . He seems upset about his previous care in hospital 2 years ago and not Understanding the difference between diastolic and systolic dysfunction. When I tried to discuss weight Loss with him he got very defensive and said " don't look at me as just a fat person".  He seems to have Some issues with family history of kidney problems and finding his father dead in bed.  Feels exhausted mild chest pain continued dyspnea at work He works at [employer] and finds It harder to push carts and ambulate.   Reviewed myovue with him done Despite being "low risk" not normal Exercised 5 minutes Max HR 164 no HTN response to exercise 7 METS ECG not ischemic  To my review there is poor uptake in the entire inferior wall that my be diaphragmatic attenuation Given normal EF  but septum also low counts   The left ventricular ejection fraction is normal (55-65%).  Nuclear stress EF: 58%.  There was no ST segment deviation noted during stress.  Defect 1: There is a small defect of severe severity present in the apical septal location.  Findings consistent with prior myocardial infarction.  This is a low risk study.   Low risk stress nuclear study with a small apicoseptal scar and diaphragmatic attenuation artifact. No reversible ischemia is seen. Normal left ventricular global systolic function.  Admitted to hospital with melena 08/07/16 had gastric ulcer Rx with protinic H pylori negative Has f/u with [facility]   Prior CV studies that were reviewed today include:    Echo 11/17:  EF 60-65 %, normal wall motion, grade 1 diastolic dysfunction, aortic sclerosis without stenosis, trivial MR, normal RVSF, moderate RAE  Carotid US 11/17:  Stable 1-39% bilateral ICA stenosis; Normal subclavian arteries, bilaterally.  Carotid US 9/15 BIlateral: ICA. 1-39%   Echo 9/15 Mild conc LVH, vigorous LVF, EF 65-70, no RWMA, Gr 1 DD, trivial pericardial eff   Myoview 3/12 IMPRESSION: 1. No evidence of exercise-induced myocardial ischemia. 2. Left ventricular wall motion study is within normal limits, with calculated ejection fraction of 57%.  Past Medical History:  Diagnosis Date  . History of Doppler ultrasound    Carotid US 11/17: Stable  1-39% bilateral ICA stenosis; Normal subclavian arteries, bilaterally.  Marland Kitchen History of echocardiogram    Echo 11/17: EF 60-65, normal wall motion, grade 1 diastolic dysfunction, aortic sclerosis without stenosis, trivial MR, normal RVSF, moderate RAE  . Hyperlipidemia 02/21/14   diet controlled, no medications  . Left acetabular fracture ([place])    nonoperative around age 14  . Stroke ([place]) 02/21/14   TIA - no limitaions from stroke  . Ulcer ([place])    Hx - no longer a problem per patient    Past Surgical History:    Procedure Laterality Date  . COSMETIC SURGERY     Dermal tatoo removal face from accident  . ESOPHAGOGASTRODUODENOSCOPY N/A 08/07/2016   Procedure: ESOPHAGOGASTRODUODENOSCOPY (EGD);  Surgeon: Manus Gunning, MD;  Location: [facility];  Service: Gastroenterology;  Laterality: N/A;  . KNEE ARTHROSCOPY W/ MENISCAL REPAIR     Both knees  . RIGHT/LEFT HEART CATH AND CORONARY ANGIOGRAPHY N/A 08/01/2016   Procedure: Right/Left Heart Cath and Coronary Angiography;  Surgeon: Sherren Mocha, MD;  Location: [facility];  Service: Cardiovascular;  Laterality: N/A;  . TONSILLECTOMY    . UPPER GASTROINTESTINAL ENDOSCOPY     hx ulcer, no longer a problem per patient  . WISDOM TOOTH EXTRACTION      Current Medications: Current Meds  Medication Sig  . aspirin EC 81 MG tablet Take 1 tablet (81 mg total) by mouth daily.  . benazepril (LOTENSIN) 40 MG tablet Take 1 tablet (40 mg total) by mouth daily.  . furosemide (LASIX) 40 MG tablet Take 1.5 tablets (60 mg total) by mouth daily.  . metoprolol succinate (TOPROL-XL) 50 MG 24 hr tablet Take 1 tablet (50 mg total) by mouth daily. Take with or immediately following a meal.  . nitroGLYCERIN (NITROSTAT) 0.4 MG SL tablet Place 1 tablet (0.4 mg total) under the tongue every 5 (five) minutes as needed for chest pain.  . pantoprazole (PROTONIX) 40 MG tablet 40 mg BID x 30 days then daily  . potassium chloride SA (KLOR-CON M20) 20 MEQ tablet Take 1.5 tablets (30 mEq total) by mouth daily.     Allergies:   Demerol [meperidine] and Oxycodone   Social History   Social History  . Marital status: Single    Spouse name: N/A  . Number of children: N/A  . Years of education: N/A   Occupational History  . housekeeping supervisor      at [employer]  . Smoking status: Former Smoker    Packs/day: 2.00    Types: Cigarettes    Start date: 07/09/1981    Quit date: 07/10/1999  . Smokeless tobacco: Never Used  . Alcohol  use No  . Drug use: Yes    Types: Methamphetamines  . Sexual activity: No     Comment: gay but not sexually active since ~ 2008   Other Topics Concern  . None   Social History Narrative   Previous Armed forces logistics/support/administrative officer.      Family History:  The patient's family history includes Cancer in his maternal grandfather, maternal grandmother, paternal grandfather, and paternal grandmother; Colon cancer in his maternal grandfather and maternal uncle; Colon polyps in his brother; Diabetes in his brother and brother; Esophageal cancer in his maternal grandmother; Hearing loss in his maternal grandmother; Heart attack in his father; Heart disease in his brother, father, paternal grandmother, and sister; Hyperlipidemia in his brother, brother, father, mother, and sister; Hypertension in his brother, brother, father, maternal  grandmother, mother, paternal grandmother, and sister.   ROS:   Please see the history of present illness.    Review of Systems  Constitution: Positive for diaphoresis and malaise/fatigue.  Cardiovascular: Positive for chest pain, dyspnea on exertion, irregular heartbeat and leg swelling.  Respiratory: Positive for cough.   Musculoskeletal: Positive for back pain.  Gastrointestinal: Positive for nausea.  Neurological: Positive for dizziness.   All other systems reviewed and are negative.   EKGs/Labs/Other Test Reviewed:    EKG:   NSR, HR 83, normal axis, QTc 446 ms, no ST changes  Recent Labs: 04/12/2016: Brain Natriuretic Peptide 24.9 08/17/2016: ALT 13; BUN 17; Creat 0.96; Hemoglobin CANCELED; Platelets CANCELED; Potassium 4.2; Sodium 138   Recent Lipid Panel    Component Value Date/Time   CHOL 154 01/20/2016 1139   TRIG 79 01/20/2016 1139   HDL 43 01/20/2016 1139   CHOLHDL 3.6 01/20/2016 1139   VLDL 16 01/20/2016 1139   LDLCALC 95 01/20/2016 1139     Physical Exam:    VS:  BP 114/60   Pulse 75   Ht 6\' 2"  (1.88 m)   Wt (!) 330 lb 1.9 oz (149.7 kg)   SpO2 97%    BMI 42.38 kg/m     Wt Readings from Last 3 Encounters:  08/28/16 (!) 330 lb 1.9 oz (149.7 kg)  08/17/16 (!) 323 lb (146.5 kg)  08/06/16 (!) 330 lb 1.6 oz (149.7 kg)     Physical Exam  Constitutional: He is oriented to person, place, and time. He appears well-developed and well-nourished. No distress.  Eyes: No scleral icterus.  Neck:  I cannot assess JVD  Cardiovascular: Normal rate, regular rhythm and normal heart sounds.   No murmur heard. Pulmonary/Chest: Effort normal. He has no wheezes. He has no rales.  Abdominal: Soft. There is no tenderness.  Musculoskeletal: He exhibits edema.  Trace-1+ bilateral LE edema  Neurological: He is alert and oriented to person, place, and time.  Skin: Skin is warm and dry.  Psychiatric: He has a normal mood and affect.    ASSESSMENT:    1. Chronic diastolic CHF (congestive heart failure) (HCC)    PLAN:    In order of problems listed above:  1. Chronic diastolic CHF - Objectively better with clear lungs and no edema but patient still with  Complaints of dyspnea Given normal cath only suggestion would be diuretic and weight loss   2. Chest pain -  ? Related to volume overload cath 08/01/16 normal cors normal RA pressure cath    3. L subclavian bruit -  Carotid US 05/02/16  with normal subclavian arteries bilaterally.    4. HTN - Well controlled.  Continue current medications and low sodium Dash type diet.  Some postural  Symptoms told him to hold his norvasc if this continues   5. HL - He is intol to statins.  6. Obesity:  He has been as heavy as 400 lbs Does not think weight is contributing to his symptoms see above Regarding right and left heart cath  7. Depression/Anxiety: F/U primary ? SSRI   8. Gastric Ulcer:  Continue protinix f/u GI H pylori negative    Jenkins Rouge

## 2016-08-28 ENCOUNTER — Ambulatory Visit (INDEPENDENT_AMBULATORY_CARE_PROVIDER_SITE_OTHER): Payer: Commercial Managed Care - PPO | Admitting: Cardiovascular Disease

## 2016-08-28 ENCOUNTER — Encounter: Payer: Self-pay | Admitting: Cardiovascular Disease

## 2016-08-28 VITALS — BP 114/60 | HR 75 | Ht 74.0 in | Wt 330.1 lb

## 2016-08-28 DIAGNOSIS — I5032 Chronic diastolic (congestive) heart failure: Secondary | ICD-10-CM | POA: Diagnosis not present

## 2016-08-28 NOTE — Patient Instructions (Signed)
**Note De-identified Nataleah Scioneaux Obfuscation** Medication Instructions:  Same-no changes  Labwork: None  Testing/Procedures: None  Follow-Up: Your physician wants you to follow-up in: 1 year. You will receive a reminder letter in the mail two months in advance. If you don't receive a letter, please call our office to schedule the follow-up appointment.      If you need a refill on your cardiac medications before your next appointment, please call your pharmacy.   

## 2016-09-21 ENCOUNTER — Ambulatory Visit (INDEPENDENT_AMBULATORY_CARE_PROVIDER_SITE_OTHER): Payer: Commercial Managed Care - PPO | Admitting: Physician Assistant

## 2016-09-21 ENCOUNTER — Other Ambulatory Visit (INDEPENDENT_AMBULATORY_CARE_PROVIDER_SITE_OTHER): Payer: Commercial Managed Care - PPO

## 2016-09-21 ENCOUNTER — Encounter: Payer: Self-pay | Admitting: Physician Assistant

## 2016-09-21 VITALS — BP 104/70 | HR 68 | Ht 74.0 in | Wt 333.0 lb

## 2016-09-21 DIAGNOSIS — K209 Esophagitis, unspecified without bleeding: Secondary | ICD-10-CM

## 2016-09-21 DIAGNOSIS — K922 Gastrointestinal hemorrhage, unspecified: Secondary | ICD-10-CM | POA: Diagnosis not present

## 2016-09-21 DIAGNOSIS — K259 Gastric ulcer, unspecified as acute or chronic, without hemorrhage or perforation: Secondary | ICD-10-CM

## 2016-09-21 LAB — CBC WITH DIFFERENTIAL/PLATELET
BASOS ABS: 0.1 10*3/uL (ref 0.0–0.1)
BASOS PCT: 1 % (ref 0.0–3.0)
EOS PCT: 1.9 % (ref 0.0–5.0)
Eosinophils Absolute: 0.1 10*3/uL (ref 0.0–0.7)
HEMATOCRIT: 41.7 % (ref 39.0–52.0)
Hemoglobin: 13.8 g/dL (ref 13.0–17.0)
LYMPHS ABS: 2 10*3/uL (ref 0.7–4.0)
LYMPHS PCT: 26.1 % (ref 12.0–46.0)
MCHC: 33.2 g/dL (ref 30.0–36.0)
MCV: 86.4 fl (ref 78.0–100.0)
MONOS PCT: 7.3 % (ref 3.0–12.0)
Monocytes Absolute: 0.6 10*3/uL (ref 0.1–1.0)
NEUTROS ABS: 4.9 10*3/uL (ref 1.4–7.7)
NEUTROS PCT: 63.7 % (ref 43.0–77.0)
PLATELETS: 253 10*3/uL (ref 150.0–400.0)
RBC: 4.83 Mil/uL (ref 4.22–5.81)
RDW: 14 % (ref 11.5–15.5)
WBC: 7.7 10*3/uL (ref 4.0–10.5)

## 2016-09-21 MED ORDER — PANTOPRAZOLE SODIUM 40 MG PO TBEC: DELAYED_RELEASE_TABLET | ORAL | 3 refills | Status: DC

## 2016-09-21 MED ORDER — SUCRALFATE 1 GM/10ML PO SUSP: 1.0000 g | Freq: Three times a day (TID) | ORAL | 1 refills | Status: DC

## 2016-09-21 MED ORDER — PANTOPRAZOLE SODIUM 40 MG PO TBEC: DELAYED_RELEASE_TABLET | ORAL | 0 refills | Status: DC

## 2016-09-21 MED ORDER — SUCRALFATE 1 GM/10ML PO SUSP: 1.0000 g | Freq: Three times a day (TID) | ORAL | 3 refills | Status: DC

## 2016-09-21 NOTE — Progress Notes (Signed)
I agree with the above note, plan 

## 2016-09-21 NOTE — Progress Notes (Signed)
Subjective:    Patient ID: [name], male    DOB: [birth year], 51 y.o.   MRN: [id number]  HPI [name] is a pleasant 51 year old white male, known to Dr. Ardis Hughs who comes in today for post hospital follow-up after recent admission with GI bleed in March 2018. Seen by Dr. Havery Moros at that time and underwent EGD which showed grade B distal esophagitis, a mild Schatzki's ring, 2 clean-based gastric ulcers largest 5 mm as well as antral gastritis and duodenitis. Biopsies were done which showed chronic inactive gastritis negative for H. pylori. He did not require transfusion. He was discharged home on Protonix 40 mg by mouth twice daily. Other medical problems include congestive heart failure, prior history of TIA, hypertension, obesity, hyperlipidemia, and history of renal cell CA. He says he felt better for a couple of weeks after discharge from the hospital and then nausea recurred as well as burning in his upper abdomen and she says is worse with eating. He has also developed increasing heartburn and some mild odynophagia. He usually feels worse in the morning when he first awakens with nausea and better with food on his stomach. He says he took the twice daily Protonix for 30 days and then his insurance messed up his prescription and he has not had the medicine over the past couple of weeks. Not taking any regular aspirin or NSAIDs. Last CBC done on March 15 hemoglobin 12.7.   Colonoscopy had been done in March 2017. Dr. Ardis Hughs with finding of 3 polyps all 3-5 mm in size and tubular adenomas. He is indicated for 5 year interval follow-up.  Review of Systems Pertinent positive and negative review of systems were noted in the above HPI section.  All other review of systems was otherwise negative.  Outpatient Encounter Prescriptions as of 09/21/2016  Medication Sig  . aspirin EC 81 MG tablet Take 1 tablet (81 mg total) by mouth daily.  . benazepril (LOTENSIN) 40 MG tablet Take 1 tablet (40 mg total)  by mouth daily.  . furosemide (LASIX) 40 MG tablet Take 1.5 tablets (60 mg total) by mouth daily.  . metoprolol succinate (TOPROL-XL) 50 MG 24 hr tablet Take 1 tablet (50 mg total) by mouth daily. Take with or immediately following a meal.  . nitroGLYCERIN (NITROSTAT) 0.4 MG SL tablet Place 1 tablet (0.4 mg total) under the tongue every 5 (five) minutes as needed for chest pain.  . potassium chloride SA (KLOR-CON M20) 20 MEQ tablet Take 1.5 tablets (30 mEq total) by mouth daily.  . pantoprazole (PROTONIX) 40 MG tablet 40 mg qam  . pantoprazole (PROTONIX) 40 MG tablet 40 mg BID  . sucralfate (CARAFATE) 1 GM/10ML suspension Take 10 mLs (1 g total) by mouth 4 (four) times daily -  with meals and at bedtime.  . [DISCONTINUED] pantoprazole (PROTONIX) 40 MG tablet 40 mg BID x 30 days then daily (Patient not taking: Reported on 09/21/2016)  . [DISCONTINUED] pantoprazole (PROTONIX) 40 MG tablet 40 mg BID x 30 days then daily  . [DISCONTINUED] sucralfate (CARAFATE) 1 GM/10ML suspension Take 10 mLs (1 g total) by mouth 4 (four) times daily -  with meals and at bedtime.   No facility-administered encounter medications on file as of 09/21/2016.    Allergies  Allergen Reactions  . Demerol [Meperidine] Anaphylaxis  . Oxycodone Rash   Patient Active Problem List   Diagnosis Date Noted  . Upper GI bleed   . Acute GI bleeding 08/06/2016  . Exertional chest pain  08/01/2016  . Chronic diastolic CHF (congestive heart failure) ([place]) 05/03/2016  . Essential hypertension 05/03/2016  . Flank pain 11/18/2015  . HLD (hyperlipidemia) 02/21/2014  . Hyperglycemia 02/21/2014  . TIA (transient ischemic attack) 02/19/2014   Social History   Social History  . Marital status: Single    Spouse name: N/A  . Number of children: N/A  . Years of education: N/A   Occupational History  . 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 Smoking status: Former Smoker    Packs/day: 2.00     Types: Cigarettes    Start date: 07/09/1981    Quit date: 07/10/1999  . Smokeless tobacco: Never Used  . Alcohol use No  . Drug use: Yes    Types: Methamphetamines  . Sexual activity: No     Comment: gay but not sexually active since ~ 2008   Other Topics Concern  . Not on file   Social History Narrative   Previous Armed forces logistics/support/administrative officer.     Mr. Deakin family history includes Cancer in his maternal grandfather, maternal grandmother, paternal grandfather, and paternal grandmother; Colon cancer in his maternal grandfather and maternal uncle; Colon polyps in his brother; Diabetes in his brother and brother; Esophageal cancer in his maternal grandmother; Hearing loss in his maternal grandmother; Heart attack in his father; Heart disease in his brother, father, paternal grandmother, and sister; Hyperlipidemia in his brother, brother, father, mother, and sister; Hypertension in his brother, brother, father, maternal grandmother, mother, paternal grandmother, and sister.      Objective:    Vitals:   09/21/16 0952  BP: 104/70  Pulse: 68    Physical Exam well-developed white male in no acute distress, blood pressure 104/70 pulse 68, height 6 foot 2, weight 333, BMI 42.7. HEENT; nontraumatic normocephalic EOMI PERRLA sclera anicteric, Rate and rhythm with S1-S2, Pulmonary; clear bilaterally, Abdomen; large soft nontender nondistended bowel sounds are active there is no palpable mass or hepatosplenomegaly, Rectal; exam not done, Ext; no clubbing cyanosis or edema skin warm and dry, Neuropsych; mood and affect appropriate       Assessment & Plan:   #24 51 year old white male here for post hospital follow-up after recent admission with upper GI bleeding March 2018 with finding of 2 clean-based gastric ulcers, antral gastritis duodenitis and grade B distal esophagitis. He is still having some persistent epigastric discomfort and nausea but has been off of PPI therapy over the past couple of weeks. #2  history of tubular adenomatous colon polyps-last colonoscopy March 2017 indicated for 5 year interval follow-up #3 Congestive heart failure #4 history of TIA continues on 81 mg aspirin #5 hyperlipidemia #6 history of renal cell CA  Plan; restart Protonix 40 mg by mouth twice daily for 3 months then decrease to one by mouth every morning before meals breakfast long-term Will add a course of Carafate liquid 1 g between meals and at bedtime 30 days Offered an antirheumatic which she does not feel he needs. Patient will follow-up with Dr. Ardis Hughs or myself on an as-needed basis. We'll plan follow-up colonoscopy 2022.  Swanson Farnell Genia Harold PA-C 09/21/2016   Cc: Susy Frizzle, MD

## 2016-09-21 NOTE — Patient Instructions (Signed)
Follow up as needed with Nicoletta Ba, PA-C or Dr. Owens Loffler.  You will be due for a recall colonoscopy in 2022. We will send you a reminder in the mail when it gets closer to that time.

## 2016-09-22 ENCOUNTER — Encounter: Payer: Self-pay | Admitting: Physician Assistant

## 2016-09-25 ENCOUNTER — Encounter: Payer: Self-pay | Admitting: Physician Assistant

## 2016-09-26 ENCOUNTER — Telehealth: Payer: Self-pay | Admitting: Physician Assistant

## 2016-09-27 ENCOUNTER — Encounter: Payer: Self-pay | Admitting: Physician Assistant

## 2016-10-08 ENCOUNTER — Encounter: Payer: Self-pay | Admitting: Family Medicine

## 2016-10-10 ENCOUNTER — Other Ambulatory Visit: Payer: Self-pay | Admitting: Family Medicine

## 2016-10-10 ENCOUNTER — Ambulatory Visit
Admission: RE | Admit: 2016-10-10 | Discharge: 2016-10-10 | Disposition: A | Discharge status: Home / Self Care | Payer: Commercial Managed Care - PPO | Source: Ambulatory Visit | Attending: Family Medicine | Admitting: Family Medicine

## 2016-10-10 ENCOUNTER — Encounter: Payer: Self-pay | Admitting: Family Medicine

## 2016-10-10 ENCOUNTER — Ambulatory Visit (INDEPENDENT_AMBULATORY_CARE_PROVIDER_SITE_OTHER): Payer: Commercial Managed Care - PPO | Admitting: Family Medicine

## 2016-10-10 VITALS — BP 100/6 | HR 60 | Temp 97.9°F | Resp 16 | Ht 74.0 in | Wt 330.0 lb

## 2016-10-10 DIAGNOSIS — F99 Mental disorder, not otherwise specified: Secondary | ICD-10-CM | POA: Diagnosis not present

## 2016-10-10 DIAGNOSIS — G4734 Idiopathic sleep related nonobstructive alveolar hypoventilation: Secondary | ICD-10-CM

## 2016-10-10 DIAGNOSIS — R410 Disorientation, unspecified: Secondary | ICD-10-CM

## 2016-10-10 DIAGNOSIS — R05 Cough: Secondary | ICD-10-CM

## 2016-10-10 DIAGNOSIS — R059 Cough, unspecified: Secondary | ICD-10-CM

## 2016-10-10 MED ORDER — LOSARTAN POTASSIUM 100 MG PO TABS: 100.0000 mg | ORAL_TABLET | Freq: Every day | ORAL | 3 refills | Status: DC

## 2016-10-10 NOTE — Progress Notes (Signed)
Subjective:    Patient ID: [name], male    DOB: Apr 01, 1966, 51 y.o.   MRN: [id number]  HPI  The patient has a couple of issues. #1 he reports a nonproductive cough since November. He denies any fever or chills. He denies any shortness of breath. He is on benazepril. He denies any chest pain. #2, the patient recently became concerned because he will be very confused and disoriented when he first woke up in the morning. This prompted him to check his pulse oximetry in the morning while he was supine in bed and his pulse oximetry will be in the 80s indicating hypoxia. However after he sat up and would take deep inspirations, his pulse oximetry would rise to greater than 95. As long as the patient is sitting up, his pulse oximetry is normal. However he finds that when he lies down, his pulse oximetry will drop particularly when he wakes up in the morning. He is also concerned by the confusion when he first wakes up. He also reports fatigue and hypersomnolence.  Past Medical History:  Diagnosis Date  . History of Doppler ultrasound    Carotid US 11/17: Stable 1-39% bilateral ICA stenosis; Normal subclavian arteries, bilaterally.  Marland Kitchen History of echocardiogram    Echo 11/17: EF 60-65, normal wall motion, grade 1 diastolic dysfunction, aortic sclerosis without stenosis, trivial MR, normal RVSF, moderate RAE  . Hyperlipidemia 02/21/14   diet controlled, no medications  . Left acetabular fracture ([place])    nonoperative around age 60  . Stroke ([place]) 02/21/14   TIA - no limitaions from stroke  . Ulcer    Hx - no longer a problem per patient   Past Surgical History:  Procedure Laterality Date  . COSMETIC SURGERY     Dermal tatoo removal face from accident  . ESOPHAGOGASTRODUODENOSCOPY N/A 08/07/2016   Procedure: ESOPHAGOGASTRODUODENOSCOPY (EGD);  Surgeon: Manus Gunning, MD;  Location: [facility];  Service: Gastroenterology;  Laterality: N/A;  . KNEE ARTHROSCOPY W/ MENISCAL REPAIR     Both knees  . RIGHT/LEFT HEART CATH AND CORONARY ANGIOGRAPHY N/A 08/01/2016   Procedure: Right/Left Heart Cath and Coronary Angiography;  Surgeon: Sherren Mocha, MD;  Location: [facility];  Service: Cardiovascular;  Laterality: N/A;  . TONSILLECTOMY    . UPPER GASTROINTESTINAL ENDOSCOPY     hx ulcer, no longer a problem per patient  . WISDOM TOOTH EXTRACTION     Current Outpatient Prescriptions on File Prior to Visit  Medication Sig Dispense Refill  . aspirin EC 81 MG tablet Take 1 tablet (81 mg total) by mouth daily. 90 tablet 3  . benazepril (LOTENSIN) 40 MG tablet Take 1 tablet (40 mg total) by mouth daily. 90 tablet 3  . furosemide (LASIX) 40 MG tablet Take 1.5 tablets (60 mg total) by mouth daily. 135 tablet 3  . metoprolol succinate (TOPROL-XL) 50 MG 24 hr tablet Take 1 tablet (50 mg total) by mouth daily. Take with or immediately following a meal. 90 tablet 3  . nitroGLYCERIN (NITROSTAT) 0.4 MG SL tablet Place 1 tablet (0.4 mg total) under the tongue every 5 (five) minutes as needed for chest pain. 25 tablet 1  . pantoprazole (PROTONIX) 40 MG tablet 40 mg qam 90 tablet 3  . potassium chloride SA (KLOR-CON M20) 20 MEQ tablet Take 1.5 tablets (30 mEq total) by mouth daily. 135 tablet 3   No current facility-administered medications on file prior to visit.    Allergies  Allergen Reactions  .  Demerol [Meperidine] Anaphylaxis  . Oxycodone Rash   Social History   Social History  . Marital status: Single    Spouse name: N/A  . Number of children: N/A  . Years of education: N/A   Occupational History  . 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 Smoking status: Former Smoker    Packs/day: 2.00    Types: Cigarettes    Start date: 07/09/1981    Quit date: 07/10/1999  . Smokeless tobacco: Never Used  . Alcohol use No  . Drug use: Yes    Types: Methamphetamines  . Sexual activity: No     Comment: gay but not sexually active since ~ 2008     Other Topics Concern  . Not on file   Social History Narrative   Previous Armed forces logistics/support/administrative officer.        Review of Systems  All other systems reviewed and are negative.      Objective:   Physical Exam  Constitutional: He appears well-developed and well-nourished.  Cardiovascular: Normal rate, regular rhythm and normal heart sounds.   No murmur heard. Pulmonary/Chest: Breath sounds normal. No respiratory distress. He has no wheezes. He has no rales. He exhibits no tenderness.  Abdominal: Soft. Bowel sounds are normal.  Musculoskeletal: He exhibits no edema.  Vitals reviewed.         Assessment & Plan:   Cough - Plan: DG Chest 2 View  Nocturnal hypoxia - Plan: Split night study  Confusion and disorientation - Plan: Split night study  I believe the cough may be due to benazepril. I will have the patient discontinue benazepril and replaced with losartan 100 mg a day. Recheck blood pressure in one month. I'll also check a chest x-ray. Given the patient's obesity and body habitus, I'm concerned that his hypoxia when supine and his confusion when he wakes up may be secondary to hypoxia due to sleep apnea. The other possibility would be obesity hypoventilation syndrome. I will characterize better using a sleep study. Given his chronic diastolic congestive heart failure, I certainly want to treat underlying sleep apnea if present.

## 2016-11-07 ENCOUNTER — Encounter: Payer: Self-pay | Admitting: Family Medicine

## 2016-11-08 ENCOUNTER — Telehealth: Payer: Self-pay | Admitting: Cardiovascular Disease

## 2016-11-08 NOTE — Telephone Encounter (Signed)
11/08/2016 1600 Call to patient to confirm he is aware of his appt with Cecilie Kicks on 11/13/2016 at 11am.  Also per research into documentation of methamphetamine it appears initial documentation of use was from PCP.  Instructed patient to discuss his concerns regarding this "error" with Dr Dennard Schaumann.  Pt verbalized understanding. Georgana Curio MHA RN CCM

## 2016-11-08 NOTE — Telephone Encounter (Signed)
11/08/2016 15:10 Returned call to patient who verbalized several concerns regarding documentation he has noted in his chart and his interpretation of comments made by Dr. Johnsie Cancel regarding him "being fat."  Pt stated "would rather die than see Dr Johnsie Cancel again."  pt requested a change in physicians, [facility] policy for changing physicians explained to patient.  Writer will notify Dr Johnsie Cancel of patient's request, and recommendation for another cardiologist.  Patient does not have a preference for another cardiologist. Pt has requested documentation in social history of Methamphetamine Drug use is not correct he has never used methamphetamines.  Writer will follow up as per patient request to have this information deleted from medical record. Pt reports recent 12lb weight gain and shortness of breath.  Mr Hoffer explained his PCP increased his lasix to 40 mg, and recommended he call his cardiologist.  Pt states he isn't in a hurry for an appt, he is doing ok.  Writer explained we will schedule an appt within the next week and call him back. Also will notify him of change in cardiologist.    Georgana Curio MHA RN CCM

## 2016-11-08 NOTE — Telephone Encounter (Signed)
New message     Please call re miss documentation on chart and , what instructions the patient were given were incorrectly

## 2016-11-08 NOTE — Telephone Encounter (Signed)
Called patient back. Patient stated he wanted to talk to clinical supervisor. Referred patient to [employer] . Patient is needing an appointment to follow-up after 12 lbs weight gain and an increased dose of lasix. Patient has an appointment with Cecilie Kicks NP for follow-up.

## 2016-11-10 ENCOUNTER — Encounter: Payer: Self-pay | Admitting: Family Medicine

## 2016-11-12 NOTE — Progress Notes (Signed)
Cardiology Office Note   Date:  11/13/2016   ID:  [name], DOB [birth year], MRN [id number]  PCP:  Susy Frizzle, MD  Cardiologist:  Changing MD    Chief Complaint  Patient presents with  . Shortness of Breath      History of Present Illness: [name] is a 51 y.o. male who presents for DOE and desat at night.    HTN, renal CA, prior TIA. LHC in 2000 was reportedly normal. He was seen by Dr. Johnsie Cancel  in 2015 and referred to EP for consideration of ILR for his TIA to r/o PAF . The patient declined monitoring at that time. Pt seen 2017 with chest pain Echocardiogram was done 05/02/16 and demonstrated normal LV function with mild diastolic dysfunction and moderate right atrial enlargement.    Cath 2018 with patent coronary arteries, vigorous LV systolic function with elevated LVEDP.  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   This was done for abnormal nuc study with infarct/scar.   In March admitted for acute GI bleed.  EGD with clean small gastric ulcers.  protonix added.   Having hypoxia upon waking sleep study ordered by PCP--July 2018.  Cough had occurred and lisnopril changed to losartan.    Today cough is resolving.  + SOB with waking and walking short distances with SOB.  He notes desat at night.  He is for sleep study. Does sleep on 3 pillows.  He had recent CXR and no active cardiopulmonary disease.  No recent chest pain.  occ palpitaions with lying down.      Past Medical History:  Diagnosis Date  . History of Doppler ultrasound    Carotid US 11/17: Stable 1-39% bilateral ICA stenosis; Normal subclavian arteries, bilaterally.  Marland Kitchen History of echocardiogram    Echo 11/17: EF 60-65, normal wall motion, grade 1 diastolic dysfunction, aortic sclerosis without stenosis, trivial MR, normal RVSF, moderate RAE  . Hyperlipidemia 02/21/14   diet controlled, no medications  . Left acetabular fracture ([place])    nonoperative around age  67  . Stroke ([place]) 02/21/14   TIA - no limitaions from stroke  . Ulcer    Hx - no longer a problem per patient    Past Surgical History:  Procedure Laterality Date  . COSMETIC SURGERY     Dermal tatoo removal face from accident  . ESOPHAGOGASTRODUODENOSCOPY N/A 08/07/2016   Procedure: ESOPHAGOGASTRODUODENOSCOPY (EGD);  Surgeon: Manus Gunning, MD;  Location: [facility];  Service: Gastroenterology;  Laterality: N/A;  . KNEE ARTHROSCOPY W/ MENISCAL REPAIR     Both knees  . RIGHT/LEFT HEART CATH AND CORONARY ANGIOGRAPHY N/A 08/01/2016   Procedure: Right/Left Heart Cath and Coronary Angiography;  Surgeon: Sherren Mocha, MD;  Location: [facility];  Service: Cardiovascular;  Laterality: N/A;  . TONSILLECTOMY    . UPPER GASTROINTESTINAL ENDOSCOPY     hx ulcer, no longer a problem per patient  . WISDOM TOOTH EXTRACTION       Current Outpatient Prescriptions  Medication Sig Dispense Refill  . aspirin EC 81 MG tablet Take 1 tablet (81 mg total) by mouth daily. 90 tablet 3  . furosemide (LASIX) 40 MG tablet Take 40 mg by mouth 2 (two) times daily.    Marland Kitchen losartan (COZAAR) 100 MG tablet Take 1 tablet (100 mg total) by mouth daily. 90 tablet 3  . metoprolol succinate (TOPROL-XL) 50 MG 24 hr tablet Take 1 tablet (50 mg total) by mouth  daily. Take with or immediately following a meal. 90 tablet 3  . nitroGLYCERIN (NITROSTAT) 0.4 MG SL tablet Place 1 tablet (0.4 mg total) under the tongue every 5 (five) minutes as needed for chest pain. 25 tablet 1  . pantoprazole (PROTONIX) 40 MG tablet Take 40 mg by mouth 2 (two) times daily.    . benazepril (LOTENSIN) 40 MG tablet Take 1 tablet (40 mg total) by mouth daily. (Patient not taking: Reported on 11/13/2016) 90 tablet 3  . potassium chloride SA (KLOR-CON M20) 20 MEQ tablet Take 1.5 tablets (30 mEq total) by mouth daily. 135 tablet 3   No current facility-administered medications for this visit.     Allergies:   Demerol [meperidine] and  Oxycodone    Social History:  The patient  reports that he quit smoking about 17 years ago. His smoking use included Cigarettes. He started smoking about 35 years ago. He smoked 2.00 packs per day. He has never used smokeless tobacco. He reports that he does not drink alcohol or use drugs.   Family History:  The patient's family history includes Cancer in his maternal grandfather, maternal grandmother, paternal grandfather, and paternal grandmother; Colon cancer in his maternal grandfather and maternal uncle; Colon polyps in his brother; Diabetes in his brother and brother; Esophageal cancer in his maternal grandmother; Hearing loss in his maternal grandmother; Heart attack in his father; Heart disease in his brother, father, paternal grandmother, and sister; Hyperlipidemia in his brother, brother, father, mother, and sister; Hypertension in his brother, brother, father, maternal grandmother, mother, paternal grandmother, and sister.    ROS:  General:no colds or fevers, + weight increase Skin:no rashes or ulcers HEENT:no blurred vision, no congestion CV:see HPI PUL:see HPI GI:no diarrhea constipation or melena, no indigestion GU:no hematuria, no dysuria MS:no joint pain, no claudication Neuro:no syncope, no lightheadedness, feels disoriented in AM Endo:no diabetes, no thyroid disease  Wt Readings from Last 3 Encounters:  11/13/16 (!) 352 lb 12.8 oz (160 kg)  10/10/16 (!) 330 lb (149.7 kg)  09/21/16 (!) 333 lb (151 kg)     PHYSICAL EXAM: VS:  BP 118/70   Pulse 73   Ht 6\' 2"  (1.88 m)   Wt (!) 352 lb 12.8 oz (160 kg)   SpO2 97%   BMI 45.30 kg/m  , BMI Body mass index is 45.3 kg/m. General:Pleasant affect, NAD Skin:Warm and dry, brisk capillary refill HEENT:normocephalic, sclera clear, mucus membranes moist Neck:supple, no JVD, no bruits  Heart:S1S2 RRR without murmur, gallup, rub or click Lungs:clear without rales, rhonchi, or wheezes NWG:NFAO, non tender, + BS, do not palpate  liver spleen or masses Ext:no to trace  lower ext edema, 2+ pedal pulses, 2+ radial pulses Neuro:alert and oriented X 3, MAE, follows commands, + facial symmetry  sp02 at rest 95% with walking 95% and HR 108  Back in room sp02 to 93% p 71 had pt lie down and he became SOB had to sit up.   EKG:  EKG is NOT ordered today.    Recent Labs: 04/12/2016: Brain Natriuretic Peptide 24.9 08/17/2016: ALT 13; BUN 17; Creat 0.96; Potassium 4.2; Sodium 138 09/21/2016: Hemoglobin 13.8; Platelets 253.0    Lipid Panel    Component Value Date/Time   CHOL 154 01/20/2016 1139   TRIG 79 01/20/2016 1139   HDL 43 01/20/2016 1139   CHOLHDL 3.6 01/20/2016 1139   VLDL 16 01/20/2016 1139   [place] 95 01/20/2016 1139       Other studies Reviewed: Additional studies/  records that were reviewed today include: . Cardiac cath 08/01/16  Sherren Mocha, MD (Primary)    Procedures   Right/Left Heart Cath and Coronary Angiography  Conclusion   1. Angiographically normal coronary arteries with no obstructive CAD 2. Vigorous LV systolic function with elevated LVEDP 3. Normal right atrial pressure, but unable to complete full right heart catheterization due to occlusive disease in the central venous circulation  Recommendations: Medical therapy/lifestyle modification for obesity and diastolic heart failure. The patient has no significant CAD.   ECHO 05/02/16 Study Conclusions  - Left ventricle: The cavity size was normal. Wall thickness was   normal. Systolic function was normal. The estimated ejection   fraction was in the range of 60% to 65%. Wall motion was normal;   there were no regional wall motion abnormalities. Doppler   parameters are consistent with abnormal left ventricular   relaxation (grade 1 diastolic dysfunction). The E/e&' ratio is   between 8-15, suggesting indeterminate LV filling pressure. - Aortic valve: Trileaflet. Sclerosis without stenosis. There was   no regurgitation. -  Mitral valve: Mildly thickened leaflets . There was trivial   regurgitation. - Left atrium: The atrium was normal in size. - Right ventricle: The cavity size was mildly dilated. Systolic   function was normal. - Right atrium: Moderately dilated. - Systemic veins: The IVC is not visualized.  Impressions:  - Compared to a prior study in 2015, there is no significant   change.   ASSESSMENT AND PLAN:  1.  SOB with orthopnea and desat.  Increase lasix to 80 mg BID for 3 days to see if any changes.  Then back to 40 mg BID also increase KDur for 3 days.  Then back to current dose.  Will follow up with Dr. Harrington Challenger or with me on day Dr. Harrington Challenger in office.  Recheck BMP in 1 week.  2.  Normal coronary arteries.   3. Desat, plan sleep study     Current medicines are reviewed with the patient today.  The patient Has no concerns regarding medicines.  The following changes have been made:  See above Labs/ tests ordered today include:see above  Disposition:   FU:  see above  Signed, Cecilie Kicks, NP  11/13/2016 10:49 PM    [place], [place], [place], [place] Phone: [phone]; Fax: [phone]

## 2016-11-13 ENCOUNTER — Ambulatory Visit (INDEPENDENT_AMBULATORY_CARE_PROVIDER_SITE_OTHER): Payer: Commercial Managed Care - PPO | Admitting: Cardiology

## 2016-11-13 ENCOUNTER — Encounter: Payer: Self-pay | Admitting: Cardiology

## 2016-11-13 VITALS — BP 118/70 | HR 73 | Ht 74.0 in | Wt 352.8 lb

## 2016-11-13 DIAGNOSIS — I10 Essential (primary) hypertension: Secondary | ICD-10-CM | POA: Diagnosis not present

## 2016-11-13 DIAGNOSIS — I5032 Chronic diastolic (congestive) heart failure: Secondary | ICD-10-CM

## 2016-11-13 DIAGNOSIS — R0602 Shortness of breath: Secondary | ICD-10-CM

## 2016-11-13 NOTE — Patient Instructions (Addendum)
Medication Instructions:  1) INCREASE Furosemide to 80mg  twice daily for 3 days, then go back to regular dosing. 2) INCREASE Potassium to 40mEq twice daily for 3 days, then go back to regular dosing.   Labwork: Your physician recommends that you return for lab work on Monday (BMET)   Testing/Procedures: None  Follow-Up: Your physician recommends that you schedule a follow-up appointment in: 2 weeks with Dr. Harrington Challenger.    Any Other Special Instructions Will Be Listed Below (If Applicable).     If you need a refill on your cardiac medications before your next appointment, please call your pharmacy.

## 2016-11-14 ENCOUNTER — Institutional Professional Consult (permissible substitution): Payer: Commercial Managed Care - PPO | Admitting: Neurology

## 2016-11-20 ENCOUNTER — Other Ambulatory Visit: Payer: Commercial Managed Care - PPO | Admitting: *Deleted

## 2016-11-20 DIAGNOSIS — I5032 Chronic diastolic (congestive) heart failure: Secondary | ICD-10-CM

## 2016-11-20 LAB — BASIC METABOLIC PANEL
BUN / CREAT RATIO: 17 (ref 9–20)
BUN: 22 mg/dL (ref 6–24)
CO2: 22 mmol/L (ref 20–29)
CREATININE: 1.27 mg/dL (ref 0.76–1.27)
Calcium: 9.6 mg/dL (ref 8.7–10.2)
Chloride: 100 mmol/L (ref 96–106)
GFR calc Af Amer: 74 mL/min/{1.73_m2} (ref >59)
GFR calc non Af Amer: 64 mL/min/{1.73_m2} (ref >59)
GLUCOSE: 123 mg/dL — AB (ref 65–99)
POTASSIUM: 5 mmol/L (ref 3.5–5.2)
SODIUM: 142 mmol/L (ref 134–144)

## 2016-12-14 ENCOUNTER — Ambulatory Visit (INDEPENDENT_AMBULATORY_CARE_PROVIDER_SITE_OTHER): Payer: Commercial Managed Care - PPO | Admitting: Physician Assistant

## 2016-12-14 ENCOUNTER — Ambulatory Visit
Admission: RE | Admit: 2016-12-14 | Discharge: 2016-12-14 | Disposition: A | Discharge status: Home / Self Care | Payer: Commercial Managed Care - PPO | Source: Ambulatory Visit | Attending: Physician Assistant | Admitting: Physician Assistant

## 2016-12-14 ENCOUNTER — Telehealth: Payer: Self-pay | Admitting: *Deleted

## 2016-12-14 ENCOUNTER — Encounter: Payer: Self-pay | Admitting: Physician Assistant

## 2016-12-14 VITALS — BP 100/62 | HR 116 | Temp 97.8°F | Resp 18 | Ht 74.5 in | Wt 344.0 lb

## 2016-12-14 DIAGNOSIS — M549 Dorsalgia, unspecified: Secondary | ICD-10-CM | POA: Diagnosis not present

## 2016-12-14 DIAGNOSIS — R109 Unspecified abdominal pain: Secondary | ICD-10-CM

## 2016-12-14 DIAGNOSIS — N200 Calculus of kidney: Secondary | ICD-10-CM | POA: Diagnosis not present

## 2016-12-14 DIAGNOSIS — R3129 Other microscopic hematuria: Secondary | ICD-10-CM | POA: Diagnosis not present

## 2016-12-14 DIAGNOSIS — R140 Abdominal distension (gaseous): Secondary | ICD-10-CM

## 2016-12-14 LAB — URINALYSIS, ROUTINE W REFLEX MICROSCOPIC
BILIRUBIN URINE: NEGATIVE
Glucose, UA: NEGATIVE
KETONES UR: NEGATIVE
Leukocytes, UA: NEGATIVE
Nitrite: NEGATIVE
PROTEIN: NEGATIVE
Specific Gravity, Urine: 1.01 (ref 1.001–1.035)
pH: 5.5 (ref 5.0–8.0)

## 2016-12-14 LAB — CBC
HEMATOCRIT: 42.5 % (ref 38.5–50.0)
HEMOGLOBIN: 14.7 g/dL (ref 13.0–17.0)
MCH: 29.7 pg (ref 27.0–33.0)
MCHC: 34.6 g/dL (ref 32.0–36.0)
MCV: 85.9 fL (ref 80.0–100.0)
Platelets: 246 10*3/uL (ref 140–400)
RBC: 4.95 MIL/uL (ref 4.20–5.80)
RDW: 13.9 % (ref 11.0–15.0)
WBC: 7.6 10*3/uL (ref 3.8–10.8)

## 2016-12-14 LAB — COMPLETE METABOLIC PANEL WITH GFR
ALBUMIN: 4.3 g/dL (ref 3.6–5.1)
ALK PHOS: 58 U/L (ref 40–115)
ALT: 22 U/L (ref 9–46)
AST: 19 U/L (ref 10–35)
BUN: 19 mg/dL (ref 7–25)
CALCIUM: 9.5 mg/dL (ref 8.6–10.3)
CHLORIDE: 102 mmol/L (ref 98–110)
CO2: 25 mmol/L (ref 20–31)
CREATININE: 1.17 mg/dL (ref 0.70–1.33)
GFR, Est African American: 82 mL/min (ref >=60)
GFR, Est Non African American: 71 mL/min (ref >=60)
Glucose, Bld: 112 mg/dL — ABNORMAL HIGH (ref 70–99)
Potassium: 4.3 mmol/L (ref 3.5–5.3)
SODIUM: 138 mmol/L (ref 135–146)
Total Bilirubin: 0.5 mg/dL (ref 0.2–1.2)
Total Protein: 7.5 g/dL (ref 6.1–8.1)

## 2016-12-14 LAB — LIPASE: Lipase: 24 U/L (ref 7–60)

## 2016-12-14 LAB — URINALYSIS, MICROSCOPIC ONLY
BACTERIA UA: NONE SEEN [HPF]
CRYSTALS: NONE SEEN [HPF]
Casts: NONE SEEN [LPF]
RBC / HPF: >60 RBC/HPF — AB (ref <=2)
Squamous Epithelial / LPF: NONE SEEN [HPF] (ref <=5)
WBC UA: NONE SEEN WBC/HPF (ref <=5)
Yeast: NONE SEEN [HPF]

## 2016-12-14 LAB — AMYLASE: AMYLASE: 30 U/L (ref 21–101)

## 2016-12-14 MED ORDER — IOPAMIDOL (ISOVUE-300) INJECTION 61%
125.0000 mL | Freq: Once | INTRAVENOUS | Status: AC | PRN
  Administered 2016-12-14: 125 mL via INTRAVENOUS

## 2016-12-14 NOTE — Progress Notes (Addendum)
Patient ID: [name] MRN: [id number], DOB: 1966-02-19, 51 y.o. Date of Encounter: @DATE @  Chief Complaint:  Chief Complaint  Patient presents with  . Abdominal Pain    x3days  . Flank Pain    x1week    HPI: 51 y.o. year old male  presents with above.  Patient points to left mid back and says that about a week ago started feeling pain there. Says that radiated towards his left groin but then about 2 later days later that part resolved. Continues to feel the discomfort in the left mid back. Says that his abdomen also feels bloated and distended. Says that he has had to loosen his belt compared to usual.  Says he has a history of kidney stone in the past but this feels different.  Says that "in 1991 he was discharged from the [facility] secondary to hematuria and kidney pain." Says that he has seen no urologist since leaving the [facility].  Reports that a couple of his stools have been a little loose but not diarrhea. Otherwise no other changes in his stool/bowel habits.   After I got the urine results back and was discussing the findings of urinalysis with him: He reports that he has seen no hematuria. Says that in the past when he would have hematuria his urine would look pink or like tea and he has not noticed that.     Past Medical History:  Diagnosis Date  . History of Doppler ultrasound    Carotid US 11/17: Stable 1-39% bilateral ICA stenosis; Normal subclavian arteries, bilaterally.  Marland Kitchen History of echocardiogram    Echo 11/17: EF 60-65, normal wall motion, grade 1 diastolic dysfunction, aortic sclerosis without stenosis, trivial MR, normal RVSF, moderate RAE  . Hyperlipidemia 02/21/14   diet controlled, no medications  . Left acetabular fracture ([place])    nonoperative around age 8  . Stroke ([place]) 02/21/14   TIA - no limitaions from stroke  . Ulcer    Hx - no longer a problem per patient     Home Meds: Outpatient Medications Prior to Visit  Medication Sig  Dispense Refill  . aspirin EC 81 MG tablet Take 1 tablet (81 mg total) by mouth daily. 90 tablet 3  . benazepril (LOTENSIN) 40 MG tablet Take 1 tablet (40 mg total) by mouth daily. 90 tablet 3  . furosemide (LASIX) 40 MG tablet Take 40 mg by mouth 2 (two) times daily.    Marland Kitchen losartan (COZAAR) 100 MG tablet Take 1 tablet (100 mg total) by mouth daily. 90 tablet 3  . metoprolol succinate (TOPROL-XL) 50 MG 24 hr tablet Take 1 tablet (50 mg total) by mouth daily. Take with or immediately following a meal. 90 tablet 3  . pantoprazole (PROTONIX) 40 MG tablet Take 40 mg by mouth 2 (two) times daily.    . nitroGLYCERIN (NITROSTAT) 0.4 MG SL tablet Place 1 tablet (0.4 mg total) under the tongue every 5 (five) minutes as needed for chest pain. 25 tablet 1  . potassium chloride SA (KLOR-CON M20) 20 MEQ tablet Take 1.5 tablets (30 mEq total) by mouth daily. 135 tablet 3   No facility-administered medications prior to visit.     Allergies:  Allergies  Allergen Reactions  . Demerol [Meperidine] Anaphylaxis  . Oxycodone Rash    Social History   Social History  . Marital status: Single    Spouse name: N/A  . Number of children: N/A  . Years of education: N/A  Occupational History  . 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 Smoking status: Former Smoker    Packs/day: 2.00    Types: Cigarettes    Start date: 07/09/1981    Quit date: 07/10/1999  . Smokeless tobacco: Never Used  . Alcohol use No  . Drug use: No     Comment: Mistake made in chart pt has never used meth.  . Sexual activity: No     Comment: gay but not sexually active since ~ 2008   Other Topics Concern  . Not on file   Social History Narrative   Previous Armed forces logistics/support/administrative officer.     Family History  Problem Relation Age of Onset  . Hyperlipidemia Mother   . Hypertension Mother   . Heart disease Father   . Hyperlipidemia Father   . Hypertension Father   . Heart attack Father   . Heart  disease Sister   . Hyperlipidemia Sister   . Hypertension Sister   . Hyperlipidemia Brother   . Hypertension Brother   . Diabetes Brother   . Colon polyps Brother   . Cancer Maternal Grandmother   . Hearing loss Maternal Grandmother   . Hypertension Maternal Grandmother   . Esophageal cancer Maternal Grandmother   . Cancer Maternal Grandfather   . Colon cancer Maternal Grandfather   . Cancer Paternal Grandmother   . Heart disease Paternal Grandmother   . Hypertension Paternal Grandmother   . Cancer Paternal Grandfather   . Diabetes Brother   . Heart disease Brother   . Hyperlipidemia Brother   . Hypertension Brother   . Colon cancer Maternal Uncle   . Rectal cancer Neg Hx   . Stomach cancer Neg Hx      Review of Systems:  See HPI for pertinent ROS. All other ROS negative.    Physical Exam: Blood pressure 100/62, pulse (!) 116, temperature 97.8 F (36.6 C), temperature source Oral, resp. rate 18, height 6' 2.5" (1.892 m), weight (!) 344 lb (156 kg), SpO2 97 %., Body mass index is 43.58 kg/m. General: Obese WM. Appears in no acute distress. Neck: Supple. No thyromegaly. No lymphadenopathy. Lungs: Clear bilaterally to auscultation without wheezes, rales, or rhonchi. Breathing is unlabored. Heart: RRR with S1 S2. No murmurs, rubs, or gallops. Abdomen: Soft,  with normoactive bowel sounds. No hepatomegaly.  No obvious abdominal masses. Everywhere I palpate causes him to feel pain in his mid abdomen --periumbilical region. Even when I palpate over around the edges of his abdomen he says it hurts but says that he is feeling the pain in the mid abdomen not where my fingers are.  Musculoskeletal:  Strength and tone normal for age. Extremities/Skin: Warm and dry.  Neuro: Alert and oriented X 3. Moves all extremities spontaneously. Gait is normal. CNII-XII grossly in tact. Psych:  Responds to questions appropriately with a normal affect.   Results for orders placed or performed in  visit on 12/14/16  Urinalysis, Routine w reflex microscopic  Result Value Ref Range   Color, Urine YELLOW YELLOW   APPearance CLOUDY (A) CLEAR   Specific Gravity, Urine 1.010 1.001 - 1.035   pH 5.5 5.0 - 8.0   Glucose, UA NEGATIVE NEGATIVE   Bilirubin Urine NEGATIVE NEGATIVE   Ketones, ur NEGATIVE NEGATIVE   Hgb urine dipstick 3+ (A) NEGATIVE   Protein, ur NEGATIVE NEGATIVE   Nitrite NEGATIVE NEGATIVE   Leukocytes, UA NEGATIVE NEGATIVE  Urine Microscopic  Result Value Ref Range   WBC, UA NONE SEEN <=5 WBC/HPF   RBC / HPF >60 (A) <=2 RBC/HPF   Squamous Epithelial / LPF NONE SEEN <=5 HPF   Bacteria, UA NONE SEEN NONE SEEN HPF   Crystals NONE SEEN NONE SEEN HPF   Casts NONE SEEN NONE SEEN LPF   Yeast NONE SEEN NONE SEEN HPF  CBC  Result Value Ref Range   WBC 7.6 3.8 - 10.8 K/uL   RBC 4.95 4.20 - 5.80 MIL/uL   Hemoglobin 14.7 13.0 - 17.0 g/dL   HCT 42.5 38.5 - 50.0 %   MCV 85.9 80.0 - 100.0 fL   MCH 29.7 27.0 - 33.0 pg   MCHC 34.6 32.0 - 36.0 g/dL   RDW 13.9 11.0 - 15.0 %   Platelets 246 140 - 400 K/uL  COMPLETE METABOLIC PANEL WITH GFR  Result Value Ref Range   Sodium  135 - 146 mmol/L   Potassium  3.5 - 5.3 mmol/L   Chloride  98 - 110 mmol/L   CO2  20 - 31 mmol/L   Glucose, Bld  70 - 99 mg/dL   BUN  7 - 25 mg/dL   Creat  0.70 - 1.33 mg/dL   Total Bilirubin  0.2 - 1.2 mg/dL   Alkaline Phosphatase  40 - 115 U/L   AST  10 - 35 U/L   ALT  9 - 46 U/L   Total Protein  6.1 - 8.1 g/dL   Albumin  3.6 - 5.1 g/dL   Calcium  8.6 - 10.3 mg/dL   GFR, Est African American  >=60 mL/min   GFR, Est Non African American  >=60 mL/min  Amylase  Result Value Ref Range   Amylase  21 - 101 U/L  Lipase  Result Value Ref Range   Lipase  7 - 60 U/L     ASSESSMENT AND PLAN:  51 y.o. year old male with   1. Acute mid back pain - Urinalysis, Routine w reflex microscopic - COMPLETE METABOLIC PANEL WITH GFR - CT Abdomen Pelvis W Contrast; Future  2. Abdominal bloating -  Urinalysis, Routine w reflex microscopic  3. Abdominal pain, unspecified abdominal location - CBC - COMPLETE METABOLIC PANEL WITH GFR - Amylase - Lipase - CT Abdomen Pelvis W Contrast; Future  4. Microscopic hematuria - CBC - COMPLETE METABOLIC PANEL WITH GFR - CT Abdomen Pelvis W Contrast; Future  UA and CBC results were reviewed while pt in office.  Other labs sent out with results pending at this time. Patient was sent directly to [facility] imaging. He is fasting. I will follow-up with him as soon as CT is complete. They're to call me with report.  Addendum added 12/14/2016 at 5 PM. I just called patient and informed him of results of CT abdomen. Discussed with him that the only significant finding on CT is 6 mm nonobstructing stone identified in the lower pole left kidney. Discussed with him that there were no other significant findings on CT. I also discussed findings from urinalysis and CBC today. I told him that I am putting in referral for him to follow-up with urology. As well we will follow-up with him once we get additional lab results. Discussed indications to go to ER for follow-up. Discussed need for follow-up with urology and he voices understanding and agrees.  [address] [place], [place], [facility] 12/14/2016 11:33 AM

## 2016-12-14 NOTE — Telephone Encounter (Signed)
Patient in office and new order given for CT.   Scheduled for walk-in at [place] for 12/14/2016.  Call placed to patient insurance and authorization is not required.   Patient made aware.

## 2016-12-14 NOTE — Addendum Note (Signed)
Addended by: Dena Billet B on: 12/14/2016 05:09 PM   Modules accepted: Orders

## 2016-12-15 ENCOUNTER — Telehealth: Payer: Self-pay

## 2016-12-15 ENCOUNTER — Encounter: Payer: Self-pay | Admitting: Physician Assistant

## 2016-12-15 MED ORDER — PROMETHAZINE HCL 25 MG PO TABS: 25.0000 mg | ORAL_TABLET | Freq: Three times a day (TID) | ORAL | 0 refills | Status: DC | PRN

## 2016-12-15 MED ORDER — TRAMADOL HCL 50 MG PO TABS: 50.0000 mg | ORAL_TABLET | Freq: Three times a day (TID) | ORAL | 0 refills | Status: DC | PRN

## 2016-12-15 NOTE — Telephone Encounter (Signed)
Called patient to go over lab results.Patient is requesting a note be faxed to his job because he ended up leaving early due to illness he was seen for in the office on 12/14/2016.  Note faxed to patient employer

## 2016-12-20 ENCOUNTER — Encounter: Payer: Self-pay | Admitting: Neurology

## 2016-12-20 ENCOUNTER — Ambulatory Visit (INDEPENDENT_AMBULATORY_CARE_PROVIDER_SITE_OTHER): Payer: Commercial Managed Care - PPO | Admitting: Neurology

## 2016-12-20 VITALS — BP 113/73 | HR 74 | Ht 74.5 in | Wt 342.0 lb

## 2016-12-20 DIAGNOSIS — Z8673 Personal history of transient ischemic attack (TIA), and cerebral infarction without residual deficits: Secondary | ICD-10-CM | POA: Diagnosis not present

## 2016-12-20 DIAGNOSIS — R51 Headache: Secondary | ICD-10-CM

## 2016-12-20 DIAGNOSIS — R0602 Shortness of breath: Secondary | ICD-10-CM | POA: Diagnosis not present

## 2016-12-20 DIAGNOSIS — R519 Headache, unspecified: Secondary | ICD-10-CM

## 2016-12-20 DIAGNOSIS — R0683 Snoring: Secondary | ICD-10-CM | POA: Diagnosis not present

## 2016-12-20 DIAGNOSIS — I5032 Chronic diastolic (congestive) heart failure: Secondary | ICD-10-CM

## 2016-12-20 DIAGNOSIS — Z6841 Body Mass Index (BMI) 40.0 and over, adult: Secondary | ICD-10-CM

## 2016-12-20 DIAGNOSIS — G4719 Other hypersomnia: Secondary | ICD-10-CM | POA: Diagnosis not present

## 2016-12-20 NOTE — Patient Instructions (Signed)

## 2016-12-20 NOTE — Progress Notes (Signed)
Subjective:    Patient ID: [name] is a 51 y.o. male.  HPI     Star Age, MD, PhD [facility] [address]O. [place], [place] [address]  Dear Dr. Dennard Schaumann,   I saw your patient, [name], upon your kind request, in my neurologic clinic today for initial consultation of his sleep disorder, in particular, concern for underlying obstructive sleep apnea. The patient is unaccompanied today. As you know, [name] is a 51 year old right-handed gentleman with an underlying medical history of hyperlipidemia, peptic ulcer disease, kidney stone, TIA, Chronic diastolic CHF, remote history of smoking and morbid obesity with a BMI of over 40, who reports snoring and excessive daytime somnolence. I reviewed your office note from 10/10/2016, he reported abnormal oxygen desaturations first thing in the morning, particularly when lying down or after first waking up. His Epworth sleepiness score is 11 out of 24, fatigue score is 56 out of 63. He is single and lives with 2 friends. He quit smoking in 2002, does not currently drink any alcohol, does endorse drinking quite a bit of caffeine in the form of 2 cups of coffee in the mornings and 3 bottles of soda during the day. He works at [employer], Radiation protection practitioner.  BT is around 8 PM, WT is around 5-6:30 AM. His older brother has OSA. He has had frequent AM HAs, does not take meds for it. He denies telltale symptoms of restless leg syndrome. He is a restless sleeper and tosses and turns a lot. He is bothered by a kidney stone. He feels like he is going to pass a kidney stone. He is going to see urology for this. He has been followed by cardiology for his chronic diastolic CHF, I reviewed the last office note when he saw the nurse practitioner on 11/13/2016, his Lasix was increased for 3 days at the time. He denies night to night nocturia.  His Past Medical History Is Significant For: Past Medical History:   Diagnosis Date  . History of Doppler ultrasound    Carotid US 11/17: Stable 1-39% bilateral ICA stenosis; Normal subclavian arteries, bilaterally.  Marland Kitchen History of echocardiogram    Echo 11/17: EF 60-65, normal wall motion, grade 1 diastolic dysfunction, aortic sclerosis without stenosis, trivial MR, normal RVSF, moderate RAE  . Hyperlipidemia 02/21/14   diet controlled, no medications  . Left acetabular fracture ([place])    nonoperative around age 73  . Stroke ([place]) 02/21/14   TIA - no limitaions from stroke  . Ulcer    Hx - no longer a problem per patient    His Past Surgical History Is Significant For: Past Surgical History:  Procedure Laterality Date  . COSMETIC SURGERY     Dermal tatoo removal face from accident  . ESOPHAGOGASTRODUODENOSCOPY N/A 08/07/2016   Procedure: ESOPHAGOGASTRODUODENOSCOPY (EGD);  Surgeon: Manus Gunning, MD;  Location: [facility];  Service: Gastroenterology;  Laterality: N/A;  . KNEE ARTHROSCOPY W/ MENISCAL REPAIR     Both knees  . RIGHT/LEFT HEART CATH AND CORONARY ANGIOGRAPHY N/A 08/01/2016   Procedure: Right/Left Heart Cath and Coronary Angiography;  Surgeon: Sherren Mocha, MD;  Location: [facility];  Service: Cardiovascular;  Laterality: N/A;  . TONSILLECTOMY    . UPPER GASTROINTESTINAL ENDOSCOPY     hx ulcer, no longer a problem per patient  . WISDOM TOOTH EXTRACTION      His Family History Is Significant For: Family History  Problem Relation Age of Onset  .  Hyperlipidemia Mother   . Hypertension Mother   . Heart disease Father   . Hyperlipidemia Father   . Hypertension Father   . Heart attack Father   . Heart disease Sister   . Hyperlipidemia Sister   . Hypertension Sister   . Hyperlipidemia Brother   . Hypertension Brother   . Diabetes Brother   . Colon polyps Brother   . Cancer Maternal Grandmother   . Hearing loss Maternal Grandmother   . Hypertension Maternal Grandmother   . Esophageal cancer Maternal Grandmother    . Cancer Maternal Grandfather   . Colon cancer Maternal Grandfather   . Cancer Paternal Grandmother   . Heart disease Paternal Grandmother   . Hypertension Paternal Grandmother   . Cancer Paternal Grandfather   . Diabetes Brother   . Heart disease Brother   . Hyperlipidemia Brother   . Hypertension Brother   . Colon cancer Maternal Uncle   . Rectal cancer Neg Hx   . Stomach cancer Neg Hx     His Social History Is Significant For: Social History   Social History  . Marital status: Single    Spouse name: N/A  . Number of children: N/A  . Years of education: N/A   Occupational History  . 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 Smoking status: Former Smoker    Packs/day: 2.00    Types: Cigarettes    Start date: 07/09/1981    Quit date: 07/10/1999  . Smokeless tobacco: Never Used  . Alcohol use No  . Drug use: No     Comment: Mistake made in chart pt has never used meth.  . Sexual activity: No     Comment: gay but not sexually active since ~ 2008   Other Topics Concern  . None   Social History Narrative   Previous Armed forces logistics/support/administrative officer.     His Allergies Are:  Allergies  Allergen Reactions  . Demerol [Meperidine] Anaphylaxis  . Oxycodone Rash  :   His Current Medications Are:  Outpatient Encounter Prescriptions as of 12/20/2016  Medication Sig  . aspirin EC 81 MG tablet Take 1 tablet (81 mg total) by mouth daily.  . furosemide (LASIX) 40 MG tablet Take 40 mg by mouth 2 (two) times daily.  Marland Kitchen losartan (COZAAR) 100 MG tablet Take 1 tablet (100 mg total) by mouth daily.  . metoprolol succinate (TOPROL-XL) 50 MG 24 hr tablet Take 1 tablet (50 mg total) by mouth daily. Take with or immediately following a meal.  . pantoprazole (PROTONIX) 40 MG tablet Take 40 mg by mouth 2 (two) times daily.  . promethazine (PHENERGAN) 25 MG tablet Take 1 tablet (25 mg total) by mouth every 8 (eight) hours as needed for nausea or vomiting.  . traMADol  (ULTRAM) 50 MG tablet Take 1 tablet (50 mg total) by mouth every 8 (eight) hours as needed.  . benazepril (LOTENSIN) 40 MG tablet Take 1 tablet (40 mg total) by mouth daily. (Patient not taking: Reported on 12/20/2016)  . nitroGLYCERIN (NITROSTAT) 0.4 MG SL tablet Place 1 tablet (0.4 mg total) under the tongue every 5 (five) minutes as needed for chest pain.  . potassium chloride SA (KLOR-CON M20) 20 MEQ tablet Take 1.5 tablets (30 mEq total) by mouth daily.   No facility-administered encounter medications on file as of 12/20/2016.   :  Review of Systems:  Out of a complete 14 point review of systems, all  are reviewed and negative with the exception of these symptoms as listed below: Review of Systems  Neurological:       Pt presents today to discuss his sleep. Pt does endorse occasional snoring but has never had a sleep study.  Epworth Sleepiness Scale 0= would never doze 1= slight chance of dozing 2= moderate chance of dozing 3= high chance of dozing  Sitting and reading: 1 Watching TV: 2 Sitting inactive in a public place (ex. Theater or meeting): 2 As a passenger in a car for an hour without a break: 0 Lying down to rest in the afternoon: 3 Sitting and talking to someone: 1 Sitting quietly after lunch (no alcohol): 2 In a car, while stopped in traffic: 0 Total: 11     Objective:  Neurological Exam  Physical Exam Physical Examination:   Vitals:   12/20/16 0854  BP: 113/73  Pulse: 74    General Examination: The patient is a very pleasant 51 y.o. male in no acute distress. He appears well-developed and well-nourished and well groomed.   HEENT: Normocephalic, atraumatic, pupils are equal, round and reactive to light and accommodation. Funduscopic exam is normal with sharp disc margins noted. Extraocular tracking is good without limitation to gaze excursion or nystagmus noted. Normal smooth pursuit is noted. Hearing is grossly intact. Tympanic membranes are clear  bilaterally. Face is symmetric with normal facial animation and normal facial sensation. Speech is clear with no dysarthria noted. There is no hypophonia. There is no lip, neck/head, jaw or voice tremor. Neck is supple with full range of passive and active motion. There are no carotid bruits on auscultation. Oropharynx exam reveals: mild mouth dryness, adequate dental hygiene and moderate airway crowding, due to smaller airway entry, thicker tongue. Mallampati is class II. Tongue protrudes centrally and palate elevates symmetrically. Tonsils are absent. Neck size is 19 5/8 inches. He has a absent overbite. Nasal inspection reveals no significant nasal mucosal bogginess or redness and no septal deviation.   Chest: Clear to auscultation without wheezing, rhonchi or crackles noted.  Heart: S1+S2+0, regular and normal without murmurs, rubs or gallops noted.   Abdomen: Soft, non-tender and non-distended with normal bowel sounds appreciated on auscultation.  Extremities: There is no pitting edema in the distal lower extremities bilaterally. Compression socks, knee high.  Skin: Warm and dry without trophic changes noted.  Musculoskeletal: exam reveals no obvious joint deformities, tenderness or joint swelling or erythema.   Neurologically:  Mental status: The patient is awake, alert and oriented in all 4 spheres. His immediate and remote memory, attention, language skills and fund of knowledge are appropriate. There is no evidence of aphasia, agnosia, apraxia or anomia. Speech is clear with normal prosody and enunciation. Thought process is linear. Mood is normal and affect is normal.  Cranial nerves II - XII are as described above under HEENT exam. In addition: shoulder shrug is normal with equal shoulder height noted. Motor exam: Normal bulk, strength and tone is noted. There is no drift, tremor or rebound. Romberg is negative, after initial sway. Reflexes are 1+ throughout, trace in both knees, absent  in the ankles. Fine motor skills and coordination: intact with normal finger taps, normal hand movements, normal rapid alternating patting, normal foot taps and normal foot agility.  Cerebellar testing: No dysmetria or intention tremor on finger to nose testing. Heel to shin is unremarkable on the L, difficulty with the R due to decrease in ROM. There is no truncal or gait ataxia.  Sensory  exam: intact to light touch in the upper and lower extremities.  Gait, station and balance: He stands easily. No veering to one side is noted. No leaning to one side is noted. Posture is age-appropriate and stance is narrow based. Gait shows normal stride length and normal pace. No problems turning are noted. Tandem walk is unremarkable.   Assessment and Plan:  In summary, [name] is a very pleasant 51 y.o.-year old male with an underlying medical history of hyperlipidemia, peptic ulcer disease, kidney stone, TIA, Chronic diastolic CHF, remote history of smoking and morbid obesity with a BMI of over 40, whose history and physical exam are  concerning for obstructive sleep apnea (OSA). I had a long chat with the patient about my findings and the diagnosis of OSA, its prognosis and treatment options. We talked about medical treatments, surgical interventions and non-pharmacological approaches. I explained in particular the risks and ramifications of untreated moderate to severe OSA, especially with respect to developing cardiovascular disease down the Road, including congestive heart failure, difficult to treat hypertension, cardiac arrhythmias, or stroke. Even type 2 diabetes has, in part, been linked to untreated OSA. Symptoms of untreated OSA include daytime sleepiness, memory problems, mood irritability and mood disorder such as depression and anxiety, lack of energy, as well as recurrent headaches, especially morning headaches. We talked about trying to maintain a healthy lifestyle in general, as well as the  importance of weight control. I encouraged the patient to eat healthy, exercise daily and keep well hydrated, to keep a scheduled bedtime and wake time routine, to not skip any meals and eat healthy snacks in between meals. I advised the patient not to drive when feeling sleepy. I recommended the following at this time: sleep study with potential positive airway pressure titration. (We will score hypopneas at 4%).   I explained the sleep test procedure to the patient and also outlined possible surgical and non-surgical treatment options of OSA, including the use of a custom-made dental device (which would require a referral to a specialist dentist or oral surgeon), upper airway surgical options, such as pillar implants, radiofrequency surgery, tongue base surgery, and UPPP (which would involve a referral to an ENT surgeon). Rarely, jaw surgery such as mandibular advancement may be considered.  I also explained the CPAP treatment option to the patient, who indicated that he would be willing to try CPAP if the need arises. I explained the importance of being compliant with PAP treatment, not only for insurance purposes but primarily to improve His symptoms, and for the patient's long term health benefit, including to reduce His cardiovascular risks. I answered all his questions today and the patient was in agreement. I would like to see him back after the sleep study is completed and encouraged him to call with any interim questions, concerns, problems or updates.   Thank you very much for allowing me to participate in the care of this nice patient. If I can be of any further assistance to you please do not hesitate to call me at [phone].  Sincerely,   Star Age, MD, PhD

## 2017-01-01 ENCOUNTER — Encounter: Payer: Self-pay | Admitting: Neurology

## 2017-01-02 ENCOUNTER — Telehealth: Payer: Self-pay | Admitting: Neurology

## 2017-01-02 NOTE — Telephone Encounter (Signed)
Noted, thank you

## 2017-01-02 NOTE — Telephone Encounter (Signed)
Patient is ready to be scheduled and has been called.

## 2017-01-07 NOTE — Progress Notes (Signed)
Mickel Baas,  I am happy to see pt.

## 2017-01-08 ENCOUNTER — Other Ambulatory Visit: Payer: Self-pay | Admitting: Family Medicine

## 2017-01-08 NOTE — Telephone Encounter (Signed)
Ok to refill 

## 2017-01-09 NOTE — Telephone Encounter (Signed)
ok 

## 2017-01-12 ENCOUNTER — Other Ambulatory Visit: Payer: Self-pay | Admitting: Family Medicine

## 2017-01-12 DIAGNOSIS — R0789 Other chest pain: Secondary | ICD-10-CM

## 2017-01-12 DIAGNOSIS — I10 Essential (primary) hypertension: Secondary | ICD-10-CM

## 2017-01-14 ENCOUNTER — Ambulatory Visit (INDEPENDENT_AMBULATORY_CARE_PROVIDER_SITE_OTHER): Payer: Commercial Managed Care - PPO | Admitting: Neurology

## 2017-01-14 DIAGNOSIS — Z6841 Body Mass Index (BMI) 40.0 and over, adult: Secondary | ICD-10-CM | POA: Diagnosis not present

## 2017-01-14 DIAGNOSIS — G4720 Circadian rhythm sleep disorder, unspecified type: Secondary | ICD-10-CM

## 2017-01-14 DIAGNOSIS — Z8673 Personal history of transient ischemic attack (TIA), and cerebral infarction without residual deficits: Secondary | ICD-10-CM

## 2017-01-14 DIAGNOSIS — I5032 Chronic diastolic (congestive) heart failure: Secondary | ICD-10-CM | POA: Diagnosis not present

## 2017-01-14 DIAGNOSIS — R0683 Snoring: Secondary | ICD-10-CM | POA: Diagnosis not present

## 2017-01-14 DIAGNOSIS — R51 Headache: Secondary | ICD-10-CM | POA: Diagnosis not present

## 2017-01-14 DIAGNOSIS — R0602 Shortness of breath: Secondary | ICD-10-CM | POA: Diagnosis not present

## 2017-01-14 DIAGNOSIS — G4719 Other hypersomnia: Secondary | ICD-10-CM

## 2017-01-14 DIAGNOSIS — G4733 Obstructive sleep apnea (adult) (pediatric): Secondary | ICD-10-CM

## 2017-01-17 ENCOUNTER — Telehealth: Payer: Self-pay

## 2017-01-17 NOTE — Procedures (Signed)
PATIENT'S NAME:  [name], [name] DOB:      23-Oct-1965      MR#:    [id number]     DATE OF RECORDING: 01/14/2017 REFERRING M.D.:  Jenna Luo, MD Study Performed:   Baseline Polysomnogram HISTORY: 51 year old man with a history of hyperlipidemia, peptic ulcer disease, kidney stone, TIA, Chronic diastolic CHF, remote history of smoking and obesity, who reports snoring and excessive daytime somnolence. His Epworth sleepiness score is 11 out of 24, fatigue score is 56 out of 63. The patient's weight 342 pounds with a height of 74 (inches), resulting in a BMI of 43.4 kg/m2. The patient's neck circumference measured 19.6 inches.  CURRENT MEDICATIONS: Aspirin 81, lasix, Cozaar, Toprol, Protonix, Phenergan, Ultram, Lotensin, Nitrostat, Potassium Chloride SA   PROCEDURE:  This is a multichannel digital polysomnogram utilizing the Somnostar 11.2 system.  Electrodes and sensors were applied and monitored per AASM Specifications.   EEG, EOG, Chin and Limb EMG, were sampled at 200 Hz.  ECG, Snore and Nasal Pressure, Thermal Airflow, Respiratory Effort, CPAP Flow and Pressure, Oximetry was sampled at 50 Hz. Digital video and audio were recorded.      BASELINE STUDY  Lights Out was at 22:16 and Lights On at 04:59. Total recording time (TRT) was 403.5 minutes, with a total sleep time (TST) of  313.5 minutes.   The patient's sleep latency was 29.5 minutes, which is delayed. REM latency was 211 minutes, which is delayed. The sleep efficiency was 77.7 %, which is reduced.     SLEEP ARCHITECTURE: WASO (Wake after sleep onset) was 60.5 minutes with mild to moderate sleep fragmentation noted. There were 9 minutes in Stage N1, 260.5 minutes Stage N2, 0 minutes Stage N3 and 44 minutes in Stage REM.  The percentage of Stage N1 was 2.9%, Stage N2 was 83.1%, which is markedly increased, Stage N3 was absent, and Stage R (REM sleep) was 14.%. which is reduced. The arousals were noted as: 4 were spontaneous, 0 were associated with  PLMs, 145 were associated with respiratory events.    Audio and video analysis did not show any abnormal or unusual movements, behaviors, phonations or vocalizations. He was noted to be restless.  The patient took no bathroom breaks. Snoring was noted, ranging from mild to loud. The EKG was in keeping with normal sinus rhythm (NSR).  RESPIRATORY ANALYSIS:  There were a total of 219 respiratory events:  10 obstructive apneas, 0 central apneas and 0 mixed apneas with a total of 10 apneas and an apnea index (AI) of 1.9 /hour. There were 209 hypopneas with a hypopnea index of 40 /hour. The patient also had 31 respiratory event related arousals (RERAs).      The total APNEA/HYPOPNEA INDEX (AHI) was 41.9/hour and the total RESPIRATORY DISTURBANCE INDEX was 47.8 /hour.  36 events occurred in REM sleep and 348 events in NREM. The REM AHI was 49.1 /hour, versus a non-REM AHI of 40.7. The patient spent 249 minutes of total sleep time in the supine position and 65 minutes in non-supine.. The supine AHI was 45.8 versus a non-supine AHI of 26.9.  OXYGEN SATURATION & C02:  The Wake baseline 02 saturation was 95%, with the lowest being 68%. Time spent below 89% saturation equaled 67 minutes.  PERIODIC LIMB MOVEMENTS: The patient had a total of 0 Periodic Limb Movements.  The Periodic Limb Movement (PLM) index was 0 and the PLM Arousal index was 0/hour.  Post-study, the patient indicated that sleep was worse than usual.  IMPRESSION:  1. Obstructive Sleep Apnea (OSA) 2. Dysfunctions associated with sleep stages or arousal from sleep  RECOMMENDATIONS:  1. This study demonstrates severe obstructive sleep apnea, with a total AHI of 41.9/hour, REM AHI of 49.1/hour, supine AHI of 45.8/hour and O2 nadir of 68% and time below 89% saturation of over 1 hour. Treatment with positive airway pressure in the form of CPAP is recommended. This will require a full night titration study to optimize therapy. Other treatment  options may include - generally speaking - avoidance of supine sleep position along with weight loss, upper airway or jaw surgery in selected patients or the use of an oral appliance in certain patients. ENT evaluation and/or consultation with a maxillofacial surgeon or dentist may be feasible in some instances.    2. Please note that untreated obstructive sleep apnea carries additional perioperative morbidity. Patients with significant obstructive sleep apnea should receive perioperative PAP therapy and the surgeons and particularly the anesthesiologist should be informed of the diagnosis and the severity of the sleep disordered breathing. 3. This study shows sleep fragmentation and abnormal sleep stage percentages; these are nonspecific findings and per se do not signify an intrinsic sleep disorder or a cause for the patient's sleep-related symptoms. Causes include (but are not limited to) the first night effect of the sleep study, circadian rhythm disturbances, medication effect or an underlying mood disorder or medical problem.  4. The patient should be cautioned not to drive, work at heights, or operate dangerous or heavy equipment when tired or sleepy. Review and reiteration of good sleep hygiene measures should be pursued with any patient. 5. The patient will be seen in follow-up by Dr. Rexene Alberts at [facility] for discussion of the test results and further management strategies. The referring provider will be notified of the test results.  I certify that I have reviewed the entire raw data recording prior to the issuance of this report in accordance with the Standards of Accreditation of the American Academy of Sleep Medicine (AASM)    Star Age, MD, PhD Diplomat, American Board of Psychiatry and Neurology (Neurology and Sleep Medicine)

## 2017-01-17 NOTE — Telephone Encounter (Signed)
-----   Message from Star Age, MD sent at 01/17/2017  8:23 AM EDT ----- Patient referred by Dr. Dennard Schaumann, seen by me on 12/20/16, diagnostic PSG on 01/14/17.    Please call and notify the patient that the recent sleep study did confirm the diagnosis of severe obstructive sleep apnea, with a total AHI of 41.9/hour, REM AHI of 49.1/hour, supine AHI of 45.8/hour and O2 nadir of 68% and time below 89% saturation of over 1 hour. I recommend treatment for this in the form of CPAP. This will require a repeat sleep study for proper titration and mask fitting. Please explain to patient and arrange for a CPAP titration study. I have placed an order in the chart. Thanks, and please route to [facility] for scheduling next sleep study.  Star Age, MD, PhD [facility])

## 2017-01-17 NOTE — Addendum Note (Signed)
Addended by: Star Age on: 01/17/2017 08:23 AM   Modules accepted: Orders

## 2017-01-17 NOTE — Telephone Encounter (Signed)
I called pt. I advised pt that Dr. Athar reviewed their sleep study results and found that pt has severe osa and recommends treatment for this in the form of a cpap. Dr. Athar recommends that pt return for a repeat sleep study in order to properly titrate the cpap and ensure a good mask fit. Pt is agreeable to returning for a titration study. I advised pt that our sleep lab will file with pt's insurance and call pt to schedule the sleep study when we hear back from the pt's insurance regarding coverage of this sleep study. Pt verbalized understanding of results. Pt had no questions at this time but was encouraged to call back if questions arise.   

## 2017-01-17 NOTE — Progress Notes (Signed)
Patient referred by Dr. Dennard Schaumann, seen by me on 12/20/16, diagnostic PSG on 01/14/17.    Please call and notify the patient that the recent sleep study did confirm the diagnosis of severe obstructive sleep apnea, with a total AHI of 41.9/hour, REM AHI of 49.1/hour, supine AHI of 45.8/hour and O2 nadir of 68% and time below 89% saturation of over 1 hour. I recommend treatment for this in the form of CPAP. This will require a repeat sleep study for proper titration and mask fitting. Please explain to patient and arrange for a CPAP titration study. I have placed an order in the chart. Thanks, and please route to [facility] for scheduling next sleep study.  Star Age, MD, PhD [facility])

## 2017-01-19 ENCOUNTER — Encounter: Payer: Self-pay | Admitting: Cardiology

## 2017-01-19 NOTE — Telephone Encounter (Signed)
Pt sent an e-mail is very unhappy with care was suppose to have seen Dr Harrington Challenger end of June and appt was never made Pt also stated has been trying to get records corrected not sure what was wrong mentioned may have to get an attorney .Offered pt an appt on Monday  August 20 at 8:20  am pt unable to come due to job obligations Will forward to Dr Harrington Challenger for review .[name]

## 2017-01-25 NOTE — Telephone Encounter (Addendum)
01/26/2017 1655 Attempts x2 to reach patient on cell and work number, message left requesting return call.  Georgana Curio MHA RN CCM  01/25/2017 11:40  Message received from Dr Alan Ripper nurse, attempted to contact patient who wasn't available.

## 2017-01-28 ENCOUNTER — Ambulatory Visit (INDEPENDENT_AMBULATORY_CARE_PROVIDER_SITE_OTHER): Payer: Commercial Managed Care - PPO | Admitting: Neurology

## 2017-01-28 DIAGNOSIS — Z0289 Encounter for other administrative examinations: Secondary | ICD-10-CM

## 2017-01-28 DIAGNOSIS — R9431 Abnormal electrocardiogram [ECG] [EKG]: Secondary | ICD-10-CM

## 2017-01-28 DIAGNOSIS — G4733 Obstructive sleep apnea (adult) (pediatric): Secondary | ICD-10-CM

## 2017-01-28 DIAGNOSIS — G4720 Circadian rhythm sleep disorder, unspecified type: Secondary | ICD-10-CM

## 2017-01-30 NOTE — Addendum Note (Signed)
Addended by: Star Age on: 01/30/2017 08:06 AM   Modules accepted: Orders

## 2017-01-30 NOTE — Procedures (Signed)
PATIENT'S NAME:  [name], [name] DOB:      1965-12-01      MR#:    [id number]     DATE OF RECORDING: 01/28/2017 REFERRING M.D.:  Jenna Luo, MD Study Performed:   CPAP  Titration HISTORY:  51 year old right-handed gentleman with an underlying medical history of hyperlipidemia, peptic ulcer disease, kidney stone, TIA, Chronic diastolic CHF, remote history of smoking and morbid obesity with a BMI of over 40, who presents for a full night CPAP titration. His baseline PSG from 01/14/17 showed severe obstructive sleep apnea, with a total AHI of 41.9/hour, REM AHI of 49.1/hour, supine AHI of 45.8/hour and O2 nadir of 68% and time below 89% saturation of over 1 hour.   CURRENT MEDICATIONS: Aspirin 81, lasix, Cozaar, Toprol, Protonix, Phenergan, Ultram, Lotensin, Nitrostat, Potassium Chloride SA   PROCEDURE:  This is a multichannel digital polysomnogram utilizing the SomnoStar 11.2 system.  Electrodes and sensors were applied and monitored per AASM Specifications.   EEG, EOG, Chin and Limb EMG, were sampled at 200 Hz.  ECG, Snore and Nasal Pressure, Thermal Airflow, Respiratory Effort, CPAP Flow and Pressure, Oximetry was sampled at 50 Hz. Digital video and audio were recorded.      The patient was noted to have perpetual mouth opening during the baseline study, but was not able to tolerate 2 different brands of FFMs. Therefore, he was fitted with medium nasal pillows, which he could tolerate, but may need a chinstrap. CPAP was initiated at 5 cmH20 with heated humidity per AASM standards and pressure was advanced to 9 cmH20 because of hypopneas, apneas and desaturations, but he slept very little on 9 cm as the study ended. Final AHI was 0/hour, O2 nadir of 91%.     Lights Out was at 22:25 and Lights On at 05:00. Total recording time (TRT) was 395 minutes, with a total sleep time (TST) of 325 minutes. The patient's sleep latency was 42.5 minutes, which is delayed. REM latency was 197 minutes, which is delayed. The  sleep efficiency was 82.3 %.    SLEEP ARCHITECTURE: WASO (Wake after sleep onset) was 33.5 minutes with mild sleep fragmentation noted. There were 14 minutes in Stage N1, 294 minutes Stage N2, 0 minutes Stage N3 and 17 minutes in Stage REM.  The percentage of Stage N1 was 4.3%, Stage N2 was 90.5%, which is highly increased, Stage N3 was absent and Stage R (REM sleep) was 5.2%, which is markedly reduced. The arousals were noted as: 24 were spontaneous, 0 were associated with PLMs, 0 were associated with respiratory events.  Audio and video analysis did not show any abnormal or unusual movements, behaviors, phonations or vocalizations. The patient took no bathroom breaks. The EKG was showed rare PVCs.   RESPIRATORY ANALYSIS:  There was a total of 2 respiratory events: 0 obstructive apneas, 0 central apneas and 0 mixed apneas with a total of 0 apneas and an apnea index (AI) of 0 /hour. There were 2 hypopneas with a hypopnea index of .4/hour. The patient also had 0 respiratory event related arousals (RERAs).      The total APNEA/HYPOPNEA INDEX  (AHI) was .4 /hour and the total RESPIRATORY DISTURBANCE INDEX was .4 .hour  1 events occurred in REM sleep and 1 events in NREM. The REM AHI was 3.5 /hour versus a non-REM AHI of .2 /hour.  The patient spent 74 minutes of total sleep time in the supine position and 251 minutes in non-supine. The supine AHI was 0.0, versus a  non-supine AHI of 0.5.  OXYGEN SATURATION & C02:  The baseline 02 saturation was 96%, with the lowest being 86%. Time spent below 89% saturation equaled 2 minutes.  PERIODIC LIMB MOVEMENTS: The patient had a total of 0 Periodic Limb Movements. The Periodic Limb Movement (PLM) index was 0 and the PLM Arousal index was 0 /hour.  Post-study, the patient indicated that sleep was the same as usual.   IMPRESSION: 1. Obstructive Sleep Apnea (OSA) 2. Dysfunctions associated with sleep stages or arousal from sleep 3. Non-specific abnormal EKG    RECOMMENDATIONS:  1.This study demonstrates resolution of the patient's obstructive sleep apnea with CPAP therapy. I will, therefore, start the patient on home CPAP treatment at a pressure of 9 cm via medium nasal pillows with heated humidity. The patient should be reminded to be fully compliant with PAP therapy to improve sleep related symptoms and decrease long term cardiovascular risks. The patient should be reminded, that it may take up to 3 months to get fully used to using PAP with all planned sleep. The earlier full compliance is achieved, the better long term compliance tends to be. Please note that untreated obstructive sleep apnea carries additional perioperative morbidity. Patients with significant obstructive sleep apnea should receive perioperative PAP therapy and the surgeons and particularly the anesthesiologist should be informed of the diagnosis and the severity of the sleep disordered breathing.  2. This study shows sleep fragmentation and abnormal sleep stage percentages; these are nonspecific findings and per se do not signify an intrinsic sleep disorder or a cause for the patient's sleep-related symptoms. Causes include (but are not limited to) the first night effect of the sleep study, circadian rhythm disturbances, medication effect or an underlying mood disorder or medical problem.  3. The study showed rare PVCs on single lead EKG; clinical correlation is recommended and consultation with cardiology may be feasible.  4. The patient should be cautioned not to drive, work at heights, or operate dangerous or heavy equipment when tired or sleepy. Review and reiteration of good sleep hygiene measures should be pursued with any patient.  5. The patient will be seen in follow-up by Dr. Rexene Alberts at [facility] for discussion of the test results and further management strategies. The referring provider will be notified of the test results.  I certify that I have reviewed the entire raw data recording  prior to the issuance of this report in accordance with the Standards of Accreditation of the American Academy of Sleep Medicine (AASM)     Star Age, MD, PhD Diplomat, American Board of Psychiatry and Neurology (Neurology and Sleep Medicine)

## 2017-01-30 NOTE — Progress Notes (Signed)
Patient referred by Dr. Dennard Schaumann, seen by me on 12/20/16, diagnostic PSG on 01/14/17, CPAP study on 01/28/17.  Please call and inform patient that I have entered an order for treatment with positive airway pressure (PAP) treatment of obstructive sleep apnea (OSA). He did well during the latest sleep study with CPAP. We will, therefore, arrange for a machine for home use through a DME (durable medical equipment) company of His choice; and I will see the patient back in follow-up in about 10 weeks. Please also explain to the patient that I will be looking out for compliance data, which can be downloaded from the machine (stored on an SD card, that is inserted in the machine) or via remote access through a modem, that is built into the machine. At the time of the followup appointment we will discuss sleep study results and how it is going with PAP treatment at home. Please advise patient to bring His machine at the time of the first FU visit, even though this is cumbersome. Bringing the machine for every visit after that will likely not be needed, but often helps for the first visit to troubleshoot if needed. Please re-enforce the importance of compliance with treatment and the need for [place] to monitor compliance data - often an insurance requirement and actually good feedback for the patient as far as how they are doing.  Also remind patient, that any interim PAP machine or mask issues should be first addressed with the DME company, as they can often help better with technical and mask fit issues. Please ask if patient has a preference regarding DME company.  Please also make sure, the patient has a follow-up appointment with me in about 10 weeks from the setup date, may see MM or  CM for this too. Thanks.  Once you have spoken to the patient - and faxed/routed report to PCP and referring MD (if other than PCP), you can close this encounter, thanks,   Star Age, MD, PhD [facility] ([place])

## 2017-02-01 ENCOUNTER — Telehealth: Payer: Self-pay

## 2017-02-01 ENCOUNTER — Encounter: Payer: Self-pay | Admitting: Neurology

## 2017-02-01 ENCOUNTER — Telehealth: Payer: Self-pay | Admitting: *Deleted

## 2017-02-01 NOTE — Telephone Encounter (Signed)
-----   Message from Star Age, MD sent at 01/30/2017  8:06 AM EDT ----- Patient referred by Dr. Dennard Schaumann, seen by me on 12/20/16, diagnostic PSG on 01/14/17, CPAP study on 01/28/17.  Please call and inform patient that I have entered an order for treatment with positive airway pressure (PAP) treatment of obstructive sleep apnea (OSA). He did well during the latest sleep study with CPAP. We will, therefore, arrange for a machine for home use through a DME (durable medical equipment) company of His choice; and I will see the patient back in follow-up in about 10 weeks. Please also explain to the patient that I will be looking out for compliance data, which can be downloaded from the machine (stored on an SD card, that is inserted in the machine) or via remote access through a modem, that is built into the machine. At the time of the followup appointment we will discuss sleep study results and how it is going with PAP treatment at home. Please advise patient to bring His machine at the time of the first FU visit, even though this is cumbersome. Bringing the machine for every visit after that will likely not be needed, but often helps for the first visit to troubleshoot if needed. Please re-enforce the importance of compliance with treatment and the need for [place] to monitor compliance data - often an insurance requirement and actually good feedback for the patient as far as how they are doing.  Also remind patient, that any interim PAP machine or mask issues should be first addressed with the DME company, as they can often help better with technical and mask fit issues. Please ask if patient has a preference regarding DME company.  Please also make sure, the patient has a follow-up appointment with me in about 10 weeks from the setup date, may see MM or  CM for this too. Thanks.  Once you have spoken to the patient - and faxed/routed report to PCP and referring MD (if other than PCP), you can close this encounter,  thanks,   Star Age, MD, PhD [facility] ([place])

## 2017-02-01 NOTE — Telephone Encounter (Signed)
I called pt. I advised pt that Dr. Rexene Alberts reviewed their sleep study results and found that pt did well with the cpap during his latest sleep study. Dr. Rexene Alberts recommends that pt start a cpap at home. I reviewed PAP compliance expectations with the pt. Pt is agreeable to starting a CPAP. I advised pt that an order will be sent to a DME, [employer], and [employer] will call the pt within about one week after they file with the pt's insurance. [employer] will show the pt how to use the machine, fit for masks, and troubleshoot the CPAP if needed. A follow up appt was made for insurance purposes with Dr. Rexene Alberts on 04/05/17 at 10:30am. Pt verbalized understanding to arrive 15 minutes early and bring their CPAP. A letter with all of this information in it will be mailed to the pt as a reminder. I verified with the pt that the address we have on file is correct. Pt verbalized understanding of results. Pt had no questions at this time but was encouraged to call back if questions arise.

## 2017-02-01 NOTE — Telephone Encounter (Signed)
I called patient in regard to obtaining appointment with Dr. Harrington Challenger.  I offered two appointments (9/7 and 9/21) but these were not good for him.  Advised I may be able to add 9/10 (no opening available but would add on).  Pt informed me he is going to talk to his PCP about a different cardiology practice.  "laura was fine, but I have been waiting for an appointment and you are telling me its going to be next month".   Advised that Dr. Harrington Challenger is not a full time physician and travels to other offices as well as hospital rounds and that is why the APPs are in place.  Advised he call back if we can be of any further assistance.

## 2017-02-14 ENCOUNTER — Other Ambulatory Visit: Payer: Self-pay | Admitting: *Deleted

## 2017-02-14 MED ORDER — LOSARTAN POTASSIUM 100 MG PO TABS: 100.0000 mg | ORAL_TABLET | Freq: Every day | ORAL | 3 refills | Status: DC

## 2017-03-02 ENCOUNTER — Ambulatory Visit (INDEPENDENT_AMBULATORY_CARE_PROVIDER_SITE_OTHER): Payer: Commercial Managed Care - PPO | Admitting: Family Medicine

## 2017-03-02 ENCOUNTER — Encounter: Payer: Self-pay | Admitting: Family Medicine

## 2017-03-02 VITALS — BP 110/68 | HR 74 | Temp 97.6°F | Resp 18 | Ht 74.0 in | Wt 349.0 lb

## 2017-03-02 DIAGNOSIS — M545 Low back pain, unspecified: Secondary | ICD-10-CM

## 2017-03-02 DIAGNOSIS — R319 Hematuria, unspecified: Secondary | ICD-10-CM

## 2017-03-02 LAB — URINALYSIS, ROUTINE W REFLEX MICROSCOPIC
BACTERIA UA: NONE SEEN /HPF
Bilirubin Urine: NEGATIVE
Glucose, UA: NEGATIVE
KETONES UR: NEGATIVE
LEUKOCYTES UA: NEGATIVE
NITRITE: NEGATIVE
PH: 6 (ref 5.0–8.0)
SQUAMOUS EPITHELIAL / LPF: NONE SEEN /HPF (ref <=5)
Specific Gravity, Urine: 1.02 (ref 1.001–1.03)
WBC UA: NONE SEEN /HPF (ref 0–5)

## 2017-03-02 LAB — MICROSCOPIC MESSAGE

## 2017-03-02 MED ORDER — HYDROMORPHONE HCL 2 MG PO TABS: 2.0000 mg | ORAL_TABLET | Freq: Four times a day (QID) | ORAL | 0 refills | Status: DC | PRN

## 2017-03-02 MED ORDER — BENAZEPRIL HCL 40 MG PO TABS: 40.0000 mg | ORAL_TABLET | Freq: Every day | ORAL | 3 refills | Status: DC

## 2017-03-02 NOTE — Progress Notes (Signed)
Subjective:    Patient ID: [name], male    DOB: 12-Mar-1966, 51 y.o.   MRN: [id number]  HPI  Patient has a history of loin pain hematuria syndrome. To be quite honest I had never heard of this before. Apparently he was discharged from the [employer] in the late 80s due to this. The patient would have recurrent episodes of flank pain with gross hematuria. He apparently saw nephrology who performed a renal biopsy and rule out any other glomerular problem. Patient had been doing well for several years. Particularly when he was on an ACE inhibitor which I discontinued in May due to cough. However I did switch him to losartan. However recently he has had several exacerbations of this condition. He saw my partner for flank pain with gross hematuria. Hematuria was confirmed by urinalysis. CT scan of the abdomen and pelvis with renal protocol revealed no explanation for his hematuria. He did have a 6 mm stone in the renal pole but this was noncontributory. Saw urology who offered no other recommendations. He has not followed up with a nephrologist since. He continues to have recurrent episodes of severe flank pain. It is now located in his left flank just below the costal vertebral angle and just above the ilium. It is reproducible by palpation. It hurts to lay in that area. He denies any pain with twisting or with flexion. Urinalysis today again confirms hematuria Past Medical History:  Diagnosis Date  . History of Doppler ultrasound    Carotid US 11/17: Stable 1-39% bilateral ICA stenosis; Normal subclavian arteries, bilaterally.  Marland Kitchen History of echocardiogram    Echo 11/17: EF 60-65, normal wall motion, grade 1 diastolic dysfunction, aortic sclerosis without stenosis, trivial MR, normal RVSF, moderate RAE  . Hyperlipidemia 02/21/14   diet controlled, no medications  . Left acetabular fracture ([place])    nonoperative around age 21  . Stroke ([place]) 02/21/14   TIA - no limitaions from stroke  . Ulcer    Hx - no  longer a problem per patient   Past Surgical History:  Procedure Laterality Date  . COSMETIC SURGERY     Dermal tatoo removal face from accident  . ESOPHAGOGASTRODUODENOSCOPY N/A 08/07/2016   Procedure: ESOPHAGOGASTRODUODENOSCOPY (EGD);  Surgeon: Manus Gunning, MD;  Location: [facility];  Service: Gastroenterology;  Laterality: N/A;  . KNEE ARTHROSCOPY W/ MENISCAL REPAIR     Both knees  . RIGHT/LEFT HEART CATH AND CORONARY ANGIOGRAPHY N/A 08/01/2016   Procedure: Right/Left Heart Cath and Coronary Angiography;  Surgeon: Sherren Mocha, MD;  Location: [facility];  Service: Cardiovascular;  Laterality: N/A;  . TONSILLECTOMY    . UPPER GASTROINTESTINAL ENDOSCOPY     hx ulcer, no longer a problem per patient  . WISDOM TOOTH EXTRACTION     Current Outpatient Prescriptions on File Prior to Visit  Medication Sig Dispense Refill  . aspirin EC 81 MG tablet Take 1 tablet (81 mg total) by mouth daily. 90 tablet 3  . furosemide (LASIX) 40 MG tablet Take 40 mg by mouth 2 (two) times daily.    Marland Kitchen losartan (COZAAR) 100 MG tablet Take 1 tablet (100 mg total) by mouth daily. 90 tablet 3  . metoprolol succinate (TOPROL-XL) 50 MG 24 hr tablet TAKE 1 TABLET DAILY WITH OR IMMEDIATELY FOLLOWING A MEAL 90 tablet 3  . pantoprazole (PROTONIX) 40 MG tablet Take 40 mg by mouth 2 (two) times daily.    . promethazine (PHENERGAN) 25 MG tablet TAKE 1 TABLET  BY MOUTH EVERY 6 HOURS AS NEEDED FOR NAUSEA 30 tablet 0  . traMADol (ULTRAM) 50 MG tablet TAKE 1 TABLET BY MOUTH EVERY 8 HOURS AS NEEDED FOR PAIN 20 tablet 0  . benazepril (LOTENSIN) 40 MG tablet Take 1 tablet (40 mg total) by mouth daily. (Patient not taking: Reported on 12/20/2016) 90 tablet 3  . nitroGLYCERIN (NITROSTAT) 0.4 MG SL tablet Place 1 tablet (0.4 mg total) under the tongue every 5 (five) minutes as needed for chest pain. 25 tablet 1  . potassium chloride SA (KLOR-CON M20) 20 MEQ tablet Take 1.5 tablets (30 mEq total) by mouth daily. 135  tablet 3   No current facility-administered medications on file prior to visit.    Allergies  Allergen Reactions  . Demerol [Meperidine] Anaphylaxis  . Oxycodone Rash   Social History   Social History  . Marital status: Single    Spouse name: N/A  . Number of children: N/A  . Years of education: N/A   Occupational History  . housekeeping supervisor      at [employer]  . Smoking status: Former Smoker    Packs/day: 2.00    Types: Cigarettes    Start date: 07/09/1981    Quit date: 07/10/1999  . Smokeless tobacco: Never Used  . Alcohol use No  . Drug use: No     Comment: Mistake made in chart pt has never used meth.  . Sexual activity: No     Comment: gay but not sexually active since ~ 2008   Other Topics Concern  . Not on file   Social History Narrative   Previous Armed forces logistics/support/administrative officer.        Review of Systems  All other systems reviewed and are negative.      Objective:   Physical Exam  Constitutional: He appears well-developed and well-nourished.  Cardiovascular: Normal rate, regular rhythm and normal heart sounds.   No murmur heard. Pulmonary/Chest: Breath sounds normal. No respiratory distress. He has no wheezes. He has no rales. He exhibits no tenderness.  Abdominal: Soft. Bowel sounds are normal.  Musculoskeletal: He exhibits no edema.       Lumbar back: He exhibits tenderness and pain. He exhibits normal range of motion, no bony tenderness, no swelling, no edema, no deformity, no laceration and no spasm.       Back:  Vitals reviewed.  There is a tender to gentle palpation in the right flank area just below the costovertebral angle and just above the ilium. I am able to reproduce his pain with gentle palpation which makes me suspect some of this is muscular. He has significant CVA tenderness       Assessment & Plan:   Hematuria, unspecified type - Plan: Urinalysis, Routine w reflex microscopic  Loin pain hematuria  syndrome - Plan: Ambulatory referral to Nephrology I will consult nephrology for a second opinion. I have no experience with this condition. I did discontinue losartan and switch the patient back to benazepril 40 mg a day to decrease glomerular pressure in 3 to help reduce the chance of exacerbation of this condition. I'll also give the patient Dilaudid (due to his allergies) 2 mg every 6 hours as needed. Patient states that he has had this pain off and on for more than 30 years. He does not believe is muscular due to his back. His pain is out of proportion to exam. Therefore I will arrange a second opinion.

## 2017-03-09 ENCOUNTER — Encounter: Payer: Self-pay | Admitting: Family Medicine

## 2017-03-12 ENCOUNTER — Other Ambulatory Visit: Payer: Self-pay | Admitting: Family Medicine

## 2017-03-12 DIAGNOSIS — R319 Hematuria, unspecified: Secondary | ICD-10-CM

## 2017-03-12 DIAGNOSIS — M545 Low back pain: Principal | ICD-10-CM

## 2017-03-12 MED ORDER — PROMETHAZINE HCL 25 MG PO TABS: 25.0000 mg | ORAL_TABLET | Freq: Four times a day (QID) | ORAL | 0 refills | Status: DC | PRN

## 2017-03-12 MED ORDER — HYDROMORPHONE HCL 2 MG PO TABS: 2.0000 mg | ORAL_TABLET | Freq: Four times a day (QID) | ORAL | 0 refills | Status: DC | PRN

## 2017-03-14 ENCOUNTER — Encounter: Payer: Self-pay | Admitting: Family Medicine

## 2017-03-20 ENCOUNTER — Telehealth: Payer: Self-pay | Admitting: Family Medicine

## 2017-03-20 NOTE — Telephone Encounter (Signed)
Explained process of Nephrology referral to patient.  He was not all that happy but understood.

## 2017-03-20 NOTE — Telephone Encounter (Signed)
-----   Message from Susy Frizzle, MD sent at 03/15/2017  7:54 AM EDT ----- Can we explain this to the patient because he is very mad and blaming [place] for dropping the ball.  ----- Message ----- From: Olena Mater, LPN Sent: 83/02/4075   7:34 AM To: Susy Frizzle, MD  These referrals go into review.  They will not give me an appointment.  They categorized the referrals by medical urgency due to pt condition.  They do not waiver from their protocol.  I have tried in the past.  I will call them though to check status. ----- Message ----- From: Susy Frizzle, MD Sent: 03/15/2017   7:03 AM To: Olena Mater, LPN  Please [name] and see what is taking so long.  Please see the chain of messages from the patient.  He is upset.  Can we at least get an appt scheduled.

## 2017-03-20 NOTE — Telephone Encounter (Signed)
I called [facility].  His referral was rec'd and has been put at a Cat 4, non urgent.  They said it may be another month until he is called for an appt. Tried to call pt, left him a message to return my call.

## 2017-04-02 ENCOUNTER — Encounter: Payer: Self-pay | Admitting: Family Medicine

## 2017-04-02 ENCOUNTER — Encounter: Payer: Self-pay | Admitting: Neurology

## 2017-04-04 ENCOUNTER — Other Ambulatory Visit: Payer: Self-pay | Admitting: Physician Assistant

## 2017-04-04 ENCOUNTER — Other Ambulatory Visit: Payer: Self-pay | Admitting: Family Medicine

## 2017-04-05 ENCOUNTER — Ambulatory Visit (INDEPENDENT_AMBULATORY_CARE_PROVIDER_SITE_OTHER): Payer: Commercial Managed Care - PPO | Admitting: Neurology

## 2017-04-05 ENCOUNTER — Encounter: Payer: Self-pay | Admitting: Neurology

## 2017-04-05 ENCOUNTER — Ambulatory Visit (INDEPENDENT_AMBULATORY_CARE_PROVIDER_SITE_OTHER): Payer: Commercial Managed Care - PPO | Admitting: Family Medicine

## 2017-04-05 ENCOUNTER — Encounter: Payer: Self-pay | Admitting: Family Medicine

## 2017-04-05 VITALS — BP 131/87 | HR 85 | Ht 74.0 in | Wt 335.5 lb

## 2017-04-05 VITALS — BP 144/80 | HR 96 | Temp 97.6°F | Resp 20 | Ht 74.0 in | Wt 332.0 lb

## 2017-04-05 DIAGNOSIS — M545 Low back pain: Secondary | ICD-10-CM

## 2017-04-05 DIAGNOSIS — R319 Hematuria, unspecified: Secondary | ICD-10-CM

## 2017-04-05 DIAGNOSIS — Z9989 Dependence on other enabling machines and devices: Secondary | ICD-10-CM

## 2017-04-05 DIAGNOSIS — G4733 Obstructive sleep apnea (adult) (pediatric): Secondary | ICD-10-CM | POA: Diagnosis not present

## 2017-04-05 MED ORDER — HYDROMORPHONE HCL 2 MG PO TABS: 2.0000 mg | ORAL_TABLET | ORAL | 0 refills | Status: DC | PRN

## 2017-04-05 MED ORDER — DICLOFENAC SODIUM 75 MG PO TBEC: 75.0000 mg | DELAYED_RELEASE_TABLET | Freq: Two times a day (BID) | ORAL | 0 refills | Status: DC

## 2017-04-05 MED ORDER — GABAPENTIN 300 MG PO CAPS: 300.0000 mg | ORAL_CAPSULE | Freq: Three times a day (TID) | ORAL | 3 refills | Status: DC

## 2017-04-05 NOTE — Progress Notes (Signed)
Subjective:    Patient ID: [name], male    DOB: 11-25-1965, 51 y.o.   MRN: [id number]  HPI  03/02/17 Patient has a history of loin pain hematuria syndrome. To be quite honest I had never heard of this before. Apparently he was discharged from the [employer] in the late 80s due to this. The patient would have recurrent episodes of flank pain with gross hematuria. He apparently saw nephrology who performed a renal biopsy and rule out any other glomerular problem. Patient had been doing well for several years. Particularly when he was on an ACE inhibitor which I discontinued in May due to cough. However I did switch him to losartan. However recently he has had several exacerbations of this condition. He saw my partner for flank pain with gross hematuria. Hematuria was confirmed by urinalysis. CT scan of the abdomen and pelvis with renal protocol revealed no explanation for his hematuria. He did have a 6 mm stone in the renal pole but this was noncontributory. Saw urology who offered no other recommendations. He has not followed up with a nephrologist since. He continues to have recurrent episodes of severe flank pain. It is now located in his left flank just below the costal vertebral angle and just above the ilium. It is reproducible by palpation. It hurts to lay in that area. He denies any pain with twisting or with flexion. Urinalysis today again confirms hematuria.   Past Medical History:  Diagnosis Date  . History of Doppler ultrasound    Carotid US 11/17: Stable 1-39% bilateral ICA stenosis; Normal subclavian arteries, bilaterally.  Marland Kitchen History of echocardiogram    Echo 11/17: EF 60-65, normal wall motion, grade 1 diastolic dysfunction, aortic sclerosis without stenosis, trivial MR, normal RVSF, moderate RAE  . Hyperlipidemia 02/21/14   diet controlled, no medications  . Left acetabular fracture ([place])    nonoperative around age 2  . Stroke ([place]) 02/21/14   TIA - no limitaions from stroke  . Ulcer     Hx - no longer a problem per patient   Past Surgical History:  Procedure Laterality Date  . colonscopy    . COSMETIC SURGERY     Dermal tatoo removal face from accident  . ESOPHAGOGASTRODUODENOSCOPY N/A 08/07/2016   Procedure: ESOPHAGOGASTRODUODENOSCOPY (EGD);  Surgeon: Manus Gunning, MD;  Location: [facility];  Service: Gastroenterology;  Laterality: N/A;  . KNEE ARTHROSCOPY W/ MENISCAL REPAIR     Both knees  . RIGHT/LEFT HEART CATH AND CORONARY ANGIOGRAPHY N/A 08/01/2016   Procedure: Right/Left Heart Cath and Coronary Angiography;  Surgeon: Sherren Mocha, MD;  Location: [facility];  Service: Cardiovascular;  Laterality: N/A;  . TONSILLECTOMY    . UPPER GASTROINTESTINAL ENDOSCOPY     hx ulcer, no longer a problem per patient  . WISDOM TOOTH EXTRACTION     Current Outpatient Prescriptions on File Prior to Visit  Medication Sig Dispense Refill  . aspirin EC 81 MG tablet Take 1 tablet (81 mg total) by mouth daily. 90 tablet 3  . furosemide (LASIX) 40 MG tablet Take 40 mg by mouth 2 (two) times daily.    Marland Kitchen losartan (COZAAR) 100 MG tablet Take 1 tablet (100 mg total) by mouth daily. 90 tablet 3  . metoprolol succinate (TOPROL-XL) 50 MG 24 hr tablet TAKE 1 TABLET DAILY WITH OR IMMEDIATELY FOLLOWING A MEAL 90 tablet 3  . naproxen sodium (ALEVE) 220 MG tablet Take 220 mg by mouth.    . nitroGLYCERIN (NITROSTAT) 0.4  MG SL tablet Place 1 tablet (0.4 mg total) under the tongue every 5 (five) minutes as needed for chest pain. 25 tablet 1  . oxymetazoline (AFRIN) 0.05 % nasal spray Place 1 spray into both nostrils 2 (two) times daily.    . pantoprazole (PROTONIX) 40 MG tablet Take 40 mg by mouth 2 (two) times daily.    Marland Kitchen Phenylephrine-APAP-Guaifenesin (TYLENOL SINUS SEVERE PO) Take by mouth.    . potassium chloride SA (KLOR-CON M20) 20 MEQ tablet Take 1.5 tablets (30 mEq total) by mouth daily. 135 tablet 3   No current facility-administered medications on file prior to visit.     Allergies  Allergen Reactions  . Demerol [Meperidine] Anaphylaxis  . Oxycodone Rash   Social History   Social History  . Marital status: Single    Spouse name: N/A  . Number of children: N/A  . Years of education: N/A   Occupational History  . 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 Smoking status: Former Smoker    Packs/day: 2.00    Types: Cigarettes    Start date: 07/09/1981    Quit date: 07/10/1999  . Smokeless tobacco: Never Used  . Alcohol use No  . Drug use: No     Comment: Mistake made in chart pt has never used meth.  . Sexual activity: No     Comment: gay but not sexually active since ~ 2008   Other Topics Concern  . Not on file   Social History Narrative   Previous Armed forces logistics/support/administrative officer.        Review of Systems  All other systems reviewed and are negative.      Objective:   Physical Exam  Constitutional: He appears well-developed and well-nourished.  Cardiovascular: Normal rate, regular rhythm and normal heart sounds.   No murmur heard. Pulmonary/Chest: Breath sounds normal. No respiratory distress. He has no wheezes. He has no rales. He exhibits no tenderness.  Abdominal: Soft. Bowel sounds are normal.  Musculoskeletal: He exhibits no edema.       Lumbar back: He exhibits tenderness and pain. He exhibits normal range of motion, no bony tenderness, no swelling, no edema, no deformity, no laceration and no spasm.       Back:  Vitals reviewed.        Assessment & Plan:   Hematuria, unspecified type - Plan: BASIC METABOLIC PANEL WITH GFR  Loin pain hematuria syndrome Had a 20-minute discussion with the patient.  He has still not heard back from the nephrologist.  My staff is called in twice I have asked my staff to call a different nephrology office to try to expedite the referral as the patient is justifiably very frustrated and upset.  He is in severe pain.  However his review of systems is otherwise negative  and does not speak to a different cause of his pain.  Therefore we will try the patient on gabapentin 300 mg p.o. 3 times daily along with diclofenac 75 mg p.o. twice daily.  I will give him Dilaudid for breakthrough pain.  He can take 2 mg every 4 hours as needed.  He plans to take this at night when he gets home from work and prior to going to sleep so 2 tablets a day would be the plan.  Therefore 60 tablets should last a month.  If this is not beneficial, I will consult pain clinic but I am hoping that nephrology number  1 can confirm the diagnosis and number 2 can also help [place] in the management of his pain.

## 2017-04-05 NOTE — Patient Instructions (Addendum)
Keep up the good work! We will see you back in 6 months for sleep apnea check up, and if you continue to do well on CPAP I will see you once a year thereafter.   Please continue using your CPAP regularly. While your insurance requires that you use CPAP at least 4 hours each night on 70% of the nights, I recommend, that you not skip any nights and use it throughout the night if you can. Getting used to CPAP and staying with the treatment long term does take time and patience and discipline. Untreated obstructive sleep apnea when it is moderate to severe can have an adverse impact on cardiovascular health and raise her risk for heart disease, arrhythmias, hypertension, congestive heart failure, stroke and diabetes. Untreated obstructive sleep apnea causes sleep disruption, nonrestorative sleep, and sleep deprivation. This can have an impact on your day to day functioning and cause daytime sleepiness and impairment of cognitive function, memory loss, mood disturbance, and problems focussing. Using CPAP regularly can improve these symptoms.

## 2017-04-05 NOTE — Progress Notes (Signed)
Subjective:    Patient ID: [name] is a 51 y.o. male.  HPI     Interim history:   [name] is a 51 year old right-handed gentleman with an underlying medical history of hyperlipidemia, peptic ulcer disease, kidney stone, TIA, Chronic diastolic CHF, remote history of smoking and morbid obesity with a BMI of over 56, who presents for follow-up consultation of his obstructive sleep apnea, after recent sleep study testing. The patient is unaccompanied today. I first met him on 12/20/2016 at the request of his primary care physician, at which time the patient reported snoring and excessive daytime somnolence as well as oxygen desaturations in the mornings. I suggested we proceed with sleep study testing. He had a baseline sleep study, followed by a CPAP titration study. I went over his test results with him in detail today. Baseline sleep study from 01/14/2017 showed a sleep efficiency of 77.7%, sleep latency of 29.5 minutes, REM latency of 212 minutes. He had absence of slow-wave sleep, an increased percentage of stage II sleep, and REM sleep was reduced at 14%. His total AHI was 41.9 per hour, REM AHI was 49.1 per hour, supine AHI was 45.8 per hour. Average oxygen saturation was 95%, nadir was 68%. He had no significant PLMS. Based on his test results I advised him to return for a full night CPAP titration study. He had this on 01/28/2017. Sleep efficiency was 82.3%, sleep latency 42.5 minutes, REM latency 197 minutes. He was fitted with medium nasal pillows, could not tolerate a full face mask. CPAP was titrated from 5 cm to 9 cm. On the final pressure he had very little sleep, AHI of 0 per hour, O2 nadir of 91%. Based on his test results I prescribed CPAP therapy for home use at a pressure of 9 cm.  Today, 04/05/2017: I reviewed his CPAP compliance data from 03/04/2017 through 04/02/2017, which is a total of 30 days, during which time he used his CPAP 29 days with percent used days greater than 4 hours  at 97%, indicating excellent compliance with an average usage of 8 hours and 50 minutes, residual AHI at goal at 1.3 per hour, leak on the higher end with the 95th percentile at 19.7 L/m on a pressure of 9 cm with EPR of 3. He reports doing better, using nasal pillows. No more AM AHs. He sleeps well. He has a hx of hematuria, no night to night nocturia.   The patient's allergies, current medications, family history, past medical history, past social history, past surgical history and problem list were reviewed and updated as appropriate.   Previously (copied from previous notes for reference):   12/20/2016: (He) reports snoring and excessive daytime somnolence. I reviewed your office note from 10/10/2016, he reported abnormal oxygen desaturations first thing in the morning, particularly when lying down or after first waking up. His Epworth sleepiness score is 11 out of 24, fatigue score is 56 out of 63. He is single and lives with 2 friends. He quit smoking in 2002, does not currently drink any alcohol, does endorse drinking quite a bit of caffeine in the form of 2 cups of coffee in the mornings and 3 bottles of soda during the day. He works at [employer], Radiation protection practitioner.  BT is around 8 PM, WT is around 5-6:30 AM. His older brother has OSA. He has had frequent AM HAs, does not take meds for it. He denies telltale symptoms of restless leg syndrome. He is a restless sleeper and  tosses and turns a lot. He is bothered by a kidney stone. He feels like he is going to pass a kidney stone. He is going to see urology for this. He has been followed by cardiology for his chronic diastolic CHF, I reviewed the last office note when he saw the nurse practitioner on 11/13/2016, his Lasix was increased for 3 days at the time. He denies night to night nocturia.  His Past Medical History Is Significant For: Past Medical History:  Diagnosis Date  . History of Doppler ultrasound    Carotid US 11/17: Stable  1-39% bilateral ICA stenosis; Normal subclavian arteries, bilaterally.  Marland Kitchen History of echocardiogram    Echo 11/17: EF 60-65, normal wall motion, grade 1 diastolic dysfunction, aortic sclerosis without stenosis, trivial MR, normal RVSF, moderate RAE  . Hyperlipidemia 02/21/14   diet controlled, no medications  . Left acetabular fracture ([place])    nonoperative around age 1  . Stroke ([place]) 02/21/14   TIA - no limitaions from stroke  . Ulcer    Hx - no longer a problem per patient    His Past Surgical History Is Significant For: Past Surgical History:  Procedure Laterality Date  . colonscopy    . COSMETIC SURGERY     Dermal tatoo removal face from accident  . ESOPHAGOGASTRODUODENOSCOPY N/A 08/07/2016   Procedure: ESOPHAGOGASTRODUODENOSCOPY (EGD);  Surgeon: Manus Gunning, MD;  Location: [facility];  Service: Gastroenterology;  Laterality: N/A;  . KNEE ARTHROSCOPY W/ MENISCAL REPAIR     Both knees  . RIGHT/LEFT HEART CATH AND CORONARY ANGIOGRAPHY N/A 08/01/2016   Procedure: Right/Left Heart Cath and Coronary Angiography;  Surgeon: Sherren Mocha, MD;  Location: [facility];  Service: Cardiovascular;  Laterality: N/A;  . TONSILLECTOMY    . UPPER GASTROINTESTINAL ENDOSCOPY     hx ulcer, no longer a problem per patient  . WISDOM TOOTH EXTRACTION      His Family History Is Significant For: Family History  Problem Relation Age of Onset  . Hyperlipidemia Mother   . Hypertension Mother   . Heart disease Father   . Hyperlipidemia Father   . Hypertension Father   . Heart attack Father   . Heart disease Sister   . Hyperlipidemia Sister   . Hypertension Sister   . Hyperlipidemia Brother   . Hypertension Brother   . Diabetes Brother   . Colon polyps Brother   . Cancer Maternal Grandmother   . Hearing loss Maternal Grandmother   . Hypertension Maternal Grandmother   . Esophageal cancer Maternal Grandmother   . Cancer Maternal Grandfather   . Colon cancer Maternal  Grandfather   . Cancer Paternal Grandmother   . Heart disease Paternal Grandmother   . Hypertension Paternal Grandmother   . Cancer Paternal Grandfather   . Diabetes Brother   . Heart disease Brother   . Hyperlipidemia Brother   . Hypertension Brother   . Colon cancer Maternal Uncle   . Rectal cancer Neg Hx   . Stomach cancer Neg Hx     His Social History Is Significant For: Social History   Social History  . Marital status: Single    Spouse name: N/A  . Number of children: N/A  . Years of education: N/A   Occupational History  . 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 Smoking status: Former Smoker    Packs/day: 2.00    Types: Cigarettes    Start  date: 07/09/1981    Quit date: 07/10/1999  . Smokeless tobacco: Never Used  . Alcohol use No  . Drug use: No     Comment: Mistake made in chart pt has never used meth.  . Sexual activity: No     Comment: gay but not sexually active since ~ 2008   Other Topics Concern  . None   Social History Narrative   Previous Armed forces logistics/support/administrative officer.    His Allergies Are:  Allergies  Allergen Reactions  . Demerol [Meperidine] Anaphylaxis  . Oxycodone Rash  :   His Current Medications Are:  Outpatient Encounter Prescriptions as of 04/05/2017  Medication Sig  . aspirin EC 81 MG tablet Take 1 tablet (81 mg total) by mouth daily.  . benazepril (LOTENSIN) 40 MG tablet Take 1 tablet (40 mg total) by mouth daily.  . furosemide (LASIX) 40 MG tablet Take 40 mg by mouth 2 (two) times daily.  Marland Kitchen losartan (COZAAR) 100 MG tablet Take 1 tablet (100 mg total) by mouth daily.  . metoprolol succinate (TOPROL-XL) 50 MG 24 hr tablet TAKE 1 TABLET DAILY WITH OR IMMEDIATELY FOLLOWING A MEAL  . naproxen sodium (ALEVE) 220 MG tablet Take 220 mg by mouth.  . nitroGLYCERIN (NITROSTAT) 0.4 MG SL tablet Place 1 tablet (0.4 mg total) under the tongue every 5 (five) minutes as needed for chest pain.  Marland Kitchen oxymetazoline (AFRIN) 0.05 %  nasal spray Place 1 spray into both nostrils 2 (two) times daily.  . pantoprazole (PROTONIX) 40 MG tablet Take 40 mg by mouth 2 (two) times daily.  Marland Kitchen Phenylephrine-APAP-Guaifenesin (TYLENOL SINUS SEVERE PO) Take by mouth.  . potassium chloride SA (KLOR-CON M20) 20 MEQ tablet Take 1.5 tablets (30 mEq total) by mouth daily.  . [DISCONTINUED] benazepril (LOTENSIN) 40 MG tablet TAKE 1 TABLET DAILY (Patient not taking: Reported on 04/05/2017)  . [DISCONTINUED] HYDROmorphone (DILAUDID) 2 MG tablet Take 1 tablet (2 mg total) by mouth every 6 (six) hours as needed for severe pain. (Patient not taking: Reported on 04/05/2017)  . [DISCONTINUED] pantoprazole (PROTONIX) 40 MG tablet TAKE 1 TABLET TWICE A DAY (Patient not taking: Reported on 04/05/2017)  . [DISCONTINUED] promethazine (PHENERGAN) 25 MG tablet TAKE 1 TABLET BY MOUTH EVERY 6 HOURS AS NEEDED FOR NAUSEA (Patient not taking: Reported on 04/05/2017)  . [DISCONTINUED] promethazine (PHENERGAN) 25 MG tablet Take 1 tablet (25 mg total) by mouth every 6 (six) hours as needed. for nausea (Patient not taking: Reported on 04/05/2017)  . [DISCONTINUED] traMADol (ULTRAM) 50 MG tablet TAKE 1 TABLET BY MOUTH EVERY 8 HOURS AS NEEDED FOR PAIN (Patient not taking: Reported on 04/05/2017)   No facility-administered encounter medications on file as of 04/05/2017.   :  Review of Systems:  Out of a complete 14 point review of systems, all are reviewed and negative with the exception of these symptoms as listed below: Review of Systems  HENT: Positive for congestion.   Respiratory: Positive for shortness of breath.   Genitourinary:       Kidney stones  Musculoskeletal:       Kidney pain  Neurological:       Patient is here for the Cipap follow up,Pt brought machine with him today.   Objective:  Neurological Exam  Physical Exam Physical Examination:   Vitals:   04/05/17 1020  BP: 131/87  Pulse: 85   General Examination: The patient is a very pleasant 51 y.o.  male in no acute distress. He appears well-developed and well-nourished and well groomed.  HEENT: Normocephalic, atraumatic, pupils are equal, round and reactive to light and accommodation. Extraocular tracking is good without limitation to gaze excursion or nystagmus noted. Normal smooth pursuit is noted. Hearing is grossly intact. Face is symmetric with normal facial animation and normal facial sensation. Speech is clear with no dysarthria noted. There is no hypophonia. There is no lip, neck/head, jaw or voice tremor. Neck is supple with full range of passive and active motion. There are no carotid bruits on auscultation. Oropharynx exam reveals: mild mouth dryness, adequate dental hygiene and moderate airway crowding. Tongue protrudes centrally and palate elevates symmetrically. Tonsils are absent. Nasal inspection reveals no sores at the nares.   Chest: Clear to auscultation without wheezing, rhonchi or crackles noted.  Heart: S1+S2+0, regular and normal without murmurs, rubs or gallops noted.   Abdomen: Soft, non-tender and non-distended with normal bowel sounds appreciated on auscultation.  Extremities: There is trace pitting edema in the distal lower extremities bilaterally. Compression socks, knee high bilaterally in place.  Skin: Warm and dry without trophic changes noted.  Musculoskeletal: exam reveals no obvious joint deformities, tenderness or joint swelling or erythema.   Neurologically:  Mental status: The patient is awake, alert and oriented in all 4 spheres. His immediate and remote memory, attention, language skills and fund of knowledge are appropriate. There is no evidence of aphasia, agnosia, apraxia or anomia. Speech is clear with normal prosody and enunciation. Thought process is linear. Mood is normal and affect is normal.  Cranial nerves II - XII are as described above under HEENT exam. In addition: shoulder shrug is normal with equal shoulder height noted. Motor  exam: Normal bulk, strength and tone is noted. There is no tremor. Romberg is negative, after initial sway. Reflexes are 1+ throughout, trace in both knees, absent in the ankles. Fine motor skills and coordination: intact with normal finger taps, normal hand movements, normal rapid alternating patting, normal foot taps and normal foot agility.  Cerebellar testing: No dysmetria or intention tremor. There is no truncal or gait ataxia.  Sensory exam: intact to light touch in the upper and lower extremities.  Gait, station and balance: He stands easily. No veering to one side is noted. No leaning to one side is noted. Posture is age-appropriate and stance is narrow based. Gait shows normal stride length and normal pace. No problems turning are noted. Tandem walk is unremarkable.   Assessment and Plan:  In summary, Clif Serio is a very pleasant 51 year old male with an underlying medical history of hyperlipidemia, peptic ulcer disease, kidney stone, TIA, Chronic diastolic CHF, remote history of smoking and morbid obesity with a BMI of over 40, who presents for follow-up consultation of his severe obstructive sleep apnea, after sleep study testing in August 2018. He had a baseline sleep study, followed by a CPAP titration study. He did well with CPAP therapy. He is compliant with treatment and indicates good results including better sleep quality, better sleep consolidation, improvement of morning headaches. He is commended for his treatment adherence. Exam is stable. We talked about his sleep study results in detail today and also reviewed his compliance data in detail. I suggested a six-month follow-up with one of our nurse practitioners. I answered all his questions today and he was in agreement.  I spent 25 minutes in total face-to-face time with the patient, more than 50% of which was spent in counseling and coordination of care, reviewing test results, reviewing medication and discussing or reviewing the  diagnosis of  OSA, its prognosis and treatment options. Pertinent laboratory and imaging test results that were available during this visit with the patient were reviewed by me and considered in my medical decision making (see chart for details).

## 2017-04-06 ENCOUNTER — Other Ambulatory Visit: Payer: Self-pay | Admitting: Family Medicine

## 2017-04-06 ENCOUNTER — Encounter: Payer: Self-pay | Admitting: Family Medicine

## 2017-04-06 DIAGNOSIS — R319 Hematuria, unspecified: Secondary | ICD-10-CM

## 2017-04-06 DIAGNOSIS — M545 Low back pain, unspecified: Secondary | ICD-10-CM

## 2017-04-06 LAB — BASIC METABOLIC PANEL WITH GFR
BUN: 18 mg/dL (ref 7–25)
CO2: 26 mmol/L (ref 20–32)
CREATININE: 1.27 mg/dL (ref 0.70–1.33)
Calcium: 10.2 mg/dL (ref 8.6–10.3)
Chloride: 102 mmol/L (ref 98–110)
GFR, EST AFRICAN AMERICAN: 74 mL/min/{1.73_m2} (ref >=60)
GFR, EST NON AFRICAN AMERICAN: 64 mL/min/{1.73_m2} (ref >=60)
Glucose, Bld: 95 mg/dL (ref 65–99)
Potassium: 3.7 mmol/L (ref 3.5–5.3)
SODIUM: 143 mmol/L (ref 135–146)

## 2017-04-12 ENCOUNTER — Encounter: Payer: Self-pay | Admitting: Family Medicine

## 2017-04-16 DIAGNOSIS — G4733 Obstructive sleep apnea (adult) (pediatric): Secondary | ICD-10-CM | POA: Diagnosis not present

## 2017-04-16 DIAGNOSIS — N182 Chronic kidney disease, stage 2 (mild): Secondary | ICD-10-CM | POA: Diagnosis not present

## 2017-04-16 DIAGNOSIS — R319 Hematuria, unspecified: Secondary | ICD-10-CM | POA: Diagnosis not present

## 2017-04-23 DIAGNOSIS — G4733 Obstructive sleep apnea (adult) (pediatric): Secondary | ICD-10-CM | POA: Diagnosis not present

## 2017-05-14 ENCOUNTER — Encounter: Payer: Self-pay | Admitting: Family Medicine

## 2017-05-14 DIAGNOSIS — N23 Unspecified renal colic: Secondary | ICD-10-CM

## 2017-05-23 DIAGNOSIS — G4733 Obstructive sleep apnea (adult) (pediatric): Secondary | ICD-10-CM | POA: Diagnosis not present

## 2017-05-24 DIAGNOSIS — E559 Vitamin D deficiency, unspecified: Secondary | ICD-10-CM | POA: Diagnosis not present

## 2017-05-24 DIAGNOSIS — R809 Proteinuria, unspecified: Secondary | ICD-10-CM | POA: Diagnosis not present

## 2017-05-24 DIAGNOSIS — N183 Chronic kidney disease, stage 3 (moderate): Secondary | ICD-10-CM | POA: Diagnosis not present

## 2017-06-01 ENCOUNTER — Telehealth: Payer: Self-pay | Admitting: *Deleted

## 2017-06-01 ENCOUNTER — Encounter: Payer: Self-pay | Admitting: Emergency Medicine

## 2017-06-01 ENCOUNTER — Other Ambulatory Visit: Payer: Self-pay

## 2017-06-01 ENCOUNTER — Ambulatory Visit: Payer: Commercial Managed Care - PPO | Admitting: Emergency Medicine

## 2017-06-01 VITALS — BP 132/64 | HR 112 | Temp 100.0°F | Resp 16 | Ht 74.0 in | Wt 342.2 lb

## 2017-06-01 DIAGNOSIS — R319 Hematuria, unspecified: Secondary | ICD-10-CM | POA: Diagnosis not present

## 2017-06-01 DIAGNOSIS — M545 Low back pain: Secondary | ICD-10-CM | POA: Diagnosis not present

## 2017-06-01 DIAGNOSIS — G894 Chronic pain syndrome: Secondary | ICD-10-CM

## 2017-06-01 DIAGNOSIS — R0981 Nasal congestion: Secondary | ICD-10-CM

## 2017-06-01 DIAGNOSIS — R05 Cough: Secondary | ICD-10-CM

## 2017-06-01 DIAGNOSIS — R059 Cough, unspecified: Secondary | ICD-10-CM

## 2017-06-01 DIAGNOSIS — J0100 Acute maxillary sinusitis, unspecified: Secondary | ICD-10-CM | POA: Diagnosis not present

## 2017-06-01 MED ORDER — AMOXICILLIN-POT CLAVULANATE 875-125 MG PO TABS: 1.0000 | ORAL_TABLET | Freq: Two times a day (BID) | ORAL | 0 refills | Status: AC

## 2017-06-01 MED ORDER — BENZONATATE 200 MG PO CAPS: 200.0000 mg | ORAL_CAPSULE | Freq: Two times a day (BID) | ORAL | 0 refills | Status: DC | PRN

## 2017-06-01 MED ORDER — PROMETHAZINE-DM 6.25-15 MG/5ML PO SYRP: 5.0000 mL | ORAL_SOLUTION | Freq: Four times a day (QID) | ORAL | 0 refills | Status: DC | PRN

## 2017-06-01 MED ORDER — PSEUDOEPHEDRINE-GUAIFENESIN ER 60-600 MG PO TB12: 1.0000 | ORAL_TABLET | Freq: Two times a day (BID) | ORAL | 1 refills | Status: AC

## 2017-06-01 NOTE — Patient Instructions (Addendum)
     IF you received an x-ray today, you will receive an invoice from [facility]. Please contact [facility] at [phone] with questions or concerns regarding your invoice.   IF you received labwork today, you will receive an invoice from [facility]. Please contact [facility] at [phone] with questions or concerns regarding your invoice.   Our billing staff will not be able to assist you with questions regarding bills from these companies.  You will be contacted with the lab results as soon as they are available. The fastest way to get your results is to activate your My Chart account. Instructions are located on the last page of this paperwork. If you have not heard from us regarding the results in 2 weeks, please contact this office.     Sinusitis, Adult Sinusitis is soreness and inflammation of your sinuses. Sinuses are hollow spaces in the bones around your face. They are located:  Around your eyes.  In the middle of your forehead.  Behind your nose.  In your cheekbones.  Your sinuses and nasal passages are lined with a stringy fluid (mucus). Mucus normally drains out of your sinuses. When your nasal tissues get inflamed or swollen, the mucus can get trapped or blocked so air cannot flow through your sinuses. This lets bacteria, viruses, and funguses grow, and that leads to infection. Follow these instructions at home: Medicines  Take, use, or apply over-the-counter and prescription medicines only as told by your doctor. These may include nasal sprays.  If you were prescribed an antibiotic medicine, take it as told by your doctor. Do not stop taking the antibiotic even if you start to feel better. Hydrate and Humidify  Drink enough water to keep your pee (urine) clear or pale yellow.  Use a cool mist humidifier to keep the humidity level in your home above 50%.  Breathe in steam for 10-15 minutes, 3-4 times a day or as told by your doctor. You can do  this in the bathroom while a hot shower is running.  Try not to spend time in cool or dry air. Rest  Rest as much as possible.  Sleep with your head raised (elevated).  Make sure to get enough sleep each night. General instructions  Put a warm, moist washcloth on your face 3-4 times a day or as told by your doctor. This will help with discomfort.  Wash your hands often with soap and water. If there is no soap and water, use hand sanitizer.  Do not smoke. Avoid being around people who are smoking (secondhand smoke).  Keep all follow-up visits as told by your doctor. This is important. Contact a doctor if:  You have a fever.  Your symptoms get worse.  Your symptoms do not get better within 10 days. Get help right away if:  You have a very bad headache.  You cannot stop throwing up (vomiting).  You have pain or swelling around your face or eyes.  You have trouble seeing.  You feel confused.  Your neck is stiff.  You have trouble breathing. This information is not intended to replace advice given to you by your health care provider. Make sure you discuss any questions you have with your health care provider. Document Released: 11/08/2007 Document Revised: 01/16/2016 Document Reviewed: 03/17/2015 Elsevier Interactive Patient Education  2018 [employer]  

## 2017-06-01 NOTE — Telephone Encounter (Signed)
Received call from patient.   Reports that he has x1 week productive cough, fever (T-max 102) that responds to reducers, but then rebounds, and malaise.   Apologized that no appointment is available. Advised if needed, patient may go to UC/ER.

## 2017-06-01 NOTE — Progress Notes (Signed)
[name] 51 y.o.   Chief Complaint  Patient presents with  . Fever    x 1 day per patient at 102 degrees  . Cough    productive with dark green mucus    HISTORY OF PRESENT ILLNESS: This is a 51 y.o. male complaining of "sinus infection" x 1 month followed last night by fever; has productive cough and nasal discharge.  Sinus Problem  This is a new problem. The current episode started 1 to 4 weeks ago. The problem has been gradually worsening since onset. The maximum temperature recorded prior to his arrival was 101 - 101.9 F. The fever has been present for less than 1 day. The pain is mild. Associated symptoms include congestion, coughing, headaches, sinus pressure and a sore throat. Pertinent negatives include no chills, ear pain, neck pain, shortness of breath or swollen glands.     Prior to Admission medications   Medication Sig Start Date End Date Taking? Authorizing Provider  aspirin EC 81 MG tablet Take 1 tablet (81 mg total) by mouth daily. 04/11/16   Richardson Dopp T, PA-C  diclofenac (VOLTAREN) 75 MG EC tablet Take 1 tablet (75 mg total) by mouth 2 (two) times daily. 04/05/17   Susy Frizzle, MD  furosemide (LASIX) 40 MG tablet Take 40 mg by mouth 2 (two) times daily.    [provider]  gabapentin (NEURONTIN) 300 MG capsule Take 1 capsule (300 mg total) by mouth 3 (three) times daily. 04/05/17   Susy Frizzle, MD  HYDROmorphone (DILAUDID) 2 MG tablet Take 1 tablet (2 mg total) by mouth every 4 (four) hours as needed for severe pain. 04/05/17   Susy Frizzle, MD  losartan (COZAAR) 100 MG tablet Take 1 tablet (100 mg total) by mouth daily. 02/14/17   Susy Frizzle, MD  metoprolol succinate (TOPROL-XL) 50 MG 24 hr tablet TAKE 1 TABLET DAILY WITH OR IMMEDIATELY FOLLOWING A MEAL 01/12/17   Susy Frizzle, MD  naproxen sodium (ALEVE) 220 MG tablet Take 220 mg by mouth.    [provider]  nitroGLYCERIN (NITROSTAT) 0.4 MG SL tablet Place 1 tablet (0.4  mg total) under the tongue every 5 (five) minutes as needed for chest pain. 07/14/16 04/05/17  Josue Hector, MD  oxymetazoline (AFRIN) 0.05 % nasal spray Place 1 spray into both nostrils 2 (two) times daily.    [provider]  pantoprazole (PROTONIX) 40 MG tablet Take 40 mg by mouth 2 (two) times daily.    [provider]  Phenylephrine-APAP-Guaifenesin (TYLENOL SINUS SEVERE PO) Take by mouth.    [provider]  potassium chloride SA (KLOR-CON M20) 20 MEQ tablet Take 1.5 tablets (30 mEq total) by mouth daily. 06/01/16 04/05/17  Richardson Dopp T, PA-C    Allergies  Allergen Reactions  . Demerol [Meperidine] Anaphylaxis  . Oxycodone Rash    Patient Active Problem List   Diagnosis Date Noted  . Upper GI bleed   . Acute GI bleeding 08/06/2016  . Exertional chest pain 08/01/2016  . Chronic diastolic CHF (congestive heart failure) ([place]) 05/03/2016  . Essential hypertension 05/03/2016  . Flank pain 11/18/2015  . HLD (hyperlipidemia) 02/21/2014  . Hyperglycemia 02/21/2014  . TIA (transient ischemic attack) 02/19/2014    Past Medical History:  Diagnosis Date  . History of Doppler ultrasound    Carotid US 11/17: Stable 1-39% bilateral ICA stenosis; Normal subclavian arteries, bilaterally.  Marland Kitchen History of echocardiogram    Echo 11/17: EF 60-65, normal wall  motion, grade 1 diastolic dysfunction, aortic sclerosis without stenosis, trivial MR, normal RVSF, moderate RAE  . Hyperlipidemia 02/21/14   diet controlled, no medications  . Left acetabular fracture ([place])    nonoperative around age 15  . Stroke ([place]) 02/21/14   TIA - no limitaions from stroke  . Ulcer    Hx - no longer a problem per patient    Past Surgical History:  Procedure Laterality Date  . colonscopy    . COSMETIC SURGERY     Dermal tatoo removal face from accident  . ESOPHAGOGASTRODUODENOSCOPY N/A 08/07/2016   Procedure: ESOPHAGOGASTRODUODENOSCOPY (EGD);  Surgeon: Manus Gunning, MD;   Location: [facility];  Service: Gastroenterology;  Laterality: N/A;  . KNEE ARTHROSCOPY W/ MENISCAL REPAIR     Both knees  . RIGHT/LEFT HEART CATH AND CORONARY ANGIOGRAPHY N/A 08/01/2016   Procedure: Right/Left Heart Cath and Coronary Angiography;  Surgeon: Sherren Mocha, MD;  Location: [facility];  Service: Cardiovascular;  Laterality: N/A;  . TONSILLECTOMY    . UPPER GASTROINTESTINAL ENDOSCOPY     hx ulcer, no longer a problem per patient  . WISDOM TOOTH EXTRACTION      Social History   Socioeconomic History  . Marital status: Single    Spouse name: Not on file  . Number of children: Not on file  . Years of education: Not on file  . Highest education level: Not on file  Social Needs  . Financial resource strain: Not on file  . Food insecurity - worry: Not on file  . Food insecurity - inability: Not on file  . Transportation needs - medical: Not on file  . Transportation needs - non-medical: Not on file  Occupational History  . Occupation: Radiation protection practitioner     Comment: at [employer]  . Smoking status: Former Smoker    Packs/day: 2.00    Types: Cigarettes    Start date: 07/09/1981    Last attempt to quit: 07/10/1999    Years since quitting: 17.9  . Smokeless tobacco: Never Used  Substance and Sexual Activity  . Alcohol use: No    Alcohol/week: 0.0 oz  . Drug use: No    Comment: Mistake made in chart pt has never used meth.  . Sexual activity: No    Birth control/protection: Abstinence    Comment: gay but not sexually active since ~ 2008  Other Topics Concern  . Not on file  Social History Narrative   Previous Armed forces logistics/support/administrative officer.     Family History  Problem Relation Age of Onset  . Hyperlipidemia Mother   . Hypertension Mother   . Heart disease Father   . Hyperlipidemia Father   . Hypertension Father   . Heart attack Father   . Heart disease Sister   . Hyperlipidemia Sister   . Hypertension Sister   . Hyperlipidemia Brother   .  Hypertension Brother   . Diabetes Brother   . Colon polyps Brother   . Cancer Maternal Grandmother   . Hearing loss Maternal Grandmother   . Hypertension Maternal Grandmother   . Esophageal cancer Maternal Grandmother   . Cancer Maternal Grandfather   . Colon cancer Maternal Grandfather   . Cancer Paternal Grandmother   . Heart disease Paternal Grandmother   . Hypertension Paternal Grandmother   . Cancer Paternal Grandfather   . Diabetes Brother   . Heart disease Brother   . Hyperlipidemia Brother   . Hypertension Brother   . Colon cancer  Maternal Uncle   . Rectal cancer Neg Hx   . Stomach cancer Neg Hx      Review of Systems  Constitutional: Positive for fever. Negative for chills.  HENT: Positive for congestion, sinus pressure and sore throat. Negative for ear pain and nosebleeds.   Eyes: Negative.   Respiratory: Positive for cough and sputum production. Negative for shortness of breath.   Cardiovascular: Negative.  Negative for chest pain and palpitations.  Gastrointestinal: Negative.  Negative for abdominal pain, diarrhea, nausea and vomiting.  Genitourinary: Positive for hematuria (chronic).  Musculoskeletal: Negative.  Negative for back pain, myalgias and neck pain.  Skin: Negative for rash.  Neurological: Positive for headaches. Negative for dizziness, tingling, sensory change and focal weakness.  Endo/Heme/Allergies: Negative.   All other systems reviewed and are negative.   Vitals:   06/01/17 1357  BP: 132/64  Pulse: (!) 112  Resp: 16  Temp: 100 F (37.8 C)  SpO2: 96%    Physical Exam  Constitutional: He is oriented to person, place, and time. He appears well-developed and well-nourished.  HENT:  Head: Normocephalic and atraumatic.  Right Ear: External ear normal.  Left Ear: External ear normal.  Nose: Mucosal edema present. Right sinus exhibits maxillary sinus tenderness. Left sinus exhibits maxillary sinus tenderness.  Mouth/Throat: Oropharynx is  clear and moist.  Eyes: Conjunctivae and EOM are normal. Pupils are equal, round, and reactive to light.  Neck: Normal range of motion. Neck supple. No JVD present. No thyromegaly present.  Cardiovascular: Normal rate, regular rhythm and normal heart sounds.  Pulmonary/Chest: Effort normal and breath sounds normal.  Abdominal: Soft. Bowel sounds are normal. He exhibits no distension. There is no tenderness.  Musculoskeletal: Normal range of motion.  Lymphadenopathy:    He has no cervical adenopathy.  Neurological: He is alert and oriented to person, place, and time. No sensory deficit. He exhibits normal muscle tone.  Skin: Skin is warm and dry. Capillary refill takes less than 2 seconds.  Psychiatric: He has a normal mood and affect. His behavior is normal.  Vitals reviewed.    ASSESSMENT & PLAN: [name] was seen today for fever and cough.  Diagnoses and all orders for this visit:  Acute non-recurrent maxillary sinusitis -     amoxicillin-clavulanate (AUGMENTIN) 875-125 MG tablet; Take 1 tablet by mouth 2 (two) times daily for 7 days.  Cough -     promethazine-dextromethorphan (PROMETHAZINE-DM) 6.25-15 MG/5ML syrup; Take 5 mLs by mouth 4 (four) times daily as needed for cough. -     benzonatate (TESSALON) 200 MG capsule; Take 1 capsule (200 mg total) by mouth 2 (two) times daily as needed for cough.  Loin pain hematuria syndrome  Chronic pain syndrome  Sinus congestion -     pseudoephedrine-guaifenesin (MUCINEX D) 60-600 MG 12 hr tablet; Take 1 tablet by mouth every 12 (twelve) hours for 5 days.    Patient Instructions       IF you received an x-ray today, you will receive an invoice from [facility]. Please contact [facility] at [phone] with questions or concerns regarding your invoice.   IF you received labwork today, you will receive an invoice from [facility]. Please contact [facility] at [phone] with questions or concerns regarding your  invoice.   Our billing staff will not be able to assist you with questions regarding bills from these companies.  You will be contacted with the lab results as soon as they are available. The fastest way to get your results is  to activate your My Chart account. Instructions are located on the last page of this paperwork. If you have not heard from [place] regarding the results in 2 weeks, please contact this office.     Sinusitis, Adult Sinusitis is soreness and inflammation of your sinuses. Sinuses are hollow spaces in the bones around your face. They are located:  Around your eyes.  In the middle of your forehead.  Behind your nose.  In your cheekbones.  Your sinuses and nasal passages are lined with a stringy fluid (mucus). Mucus normally drains out of your sinuses. When your nasal tissues get inflamed or swollen, the mucus can get trapped or blocked so air cannot flow through your sinuses. This lets bacteria, viruses, and funguses grow, and that leads to infection. Follow these instructions at home: Medicines  Take, use, or apply over-the-counter and prescription medicines only as told by your doctor. These may include nasal sprays.  If you were prescribed an antibiotic medicine, take it as told by your doctor. Do not stop taking the antibiotic even if you start to feel better. Hydrate and Humidify  Drink enough water to keep your pee (urine) clear or pale yellow.  Use a cool mist humidifier to keep the humidity level in your home above 50%.  Breathe in steam for 10-15 minutes, 3-4 times a day or as told by your doctor. You can do this in the bathroom while a hot shower is running.  Try not to spend time in cool or dry air. Rest  Rest as much as possible.  Sleep with your head raised (elevated).  Make sure to get enough sleep each night. General instructions  Put a warm, moist washcloth on your face 3-4 times a day or as told by your doctor. This will help with  discomfort.  Wash your hands often with soap and water. If there is no soap and water, use hand sanitizer.  Do not smoke. Avoid being around people who are smoking (secondhand smoke).  Keep all follow-up visits as told by your doctor. This is important. Contact a doctor if:  You have a fever.  Your symptoms get worse.  Your symptoms do not get better within 10 days. Get help right away if:  You have a very bad headache.  You cannot stop throwing up (vomiting).  You have pain or swelling around your face or eyes.  You have trouble seeing.  You feel confused.  Your neck is stiff.  You have trouble breathing. This information is not intended to replace advice given to you by your health care provider. Make sure you discuss any questions you have with your health care provider. Document Released: 11/08/2007 Document Revised: 01/16/2016 Document Reviewed: 03/17/2015 Elsevier Interactive Patient Education  2018 Elsevier Inc.      Agustina Caroli, MD [facility]

## 2017-06-02 ENCOUNTER — Other Ambulatory Visit: Payer: Self-pay | Admitting: Physician Assistant

## 2017-06-08 DIAGNOSIS — R809 Proteinuria, unspecified: Secondary | ICD-10-CM | POA: Diagnosis not present

## 2017-06-08 DIAGNOSIS — N182 Chronic kidney disease, stage 2 (mild): Secondary | ICD-10-CM | POA: Diagnosis not present

## 2017-06-19 ENCOUNTER — Other Ambulatory Visit: Payer: Self-pay | Admitting: Family Medicine

## 2017-06-19 ENCOUNTER — Encounter: Payer: Self-pay | Admitting: Family Medicine

## 2017-06-19 MED ORDER — PROMETHAZINE HCL 25 MG PO TABS: 25.0000 mg | ORAL_TABLET | Freq: Three times a day (TID) | ORAL | 0 refills | Status: DC | PRN

## 2017-06-19 MED ORDER — HYDROMORPHONE HCL 2 MG PO TABS: 2.0000 mg | ORAL_TABLET | ORAL | 0 refills | Status: DC | PRN

## 2017-06-26 DIAGNOSIS — R109 Unspecified abdominal pain: Secondary | ICD-10-CM | POA: Diagnosis not present

## 2017-06-26 DIAGNOSIS — G894 Chronic pain syndrome: Secondary | ICD-10-CM | POA: Diagnosis not present

## 2017-06-26 DIAGNOSIS — M545 Low back pain: Secondary | ICD-10-CM | POA: Diagnosis not present

## 2017-07-09 ENCOUNTER — Encounter: Payer: Self-pay | Admitting: Family Medicine

## 2017-07-12 ENCOUNTER — Ambulatory Visit: Payer: Commercial Managed Care - PPO | Admitting: Family Medicine

## 2017-07-12 ENCOUNTER — Encounter: Payer: Self-pay | Admitting: Family Medicine

## 2017-07-12 ENCOUNTER — Other Ambulatory Visit: Payer: Self-pay | Admitting: Family Medicine

## 2017-07-12 VITALS — BP 130/80 | HR 80 | Temp 98.2°F | Resp 18 | Ht 74.0 in | Wt 336.0 lb

## 2017-07-12 DIAGNOSIS — R319 Hematuria, unspecified: Secondary | ICD-10-CM

## 2017-07-12 DIAGNOSIS — E538 Deficiency of other specified B group vitamins: Secondary | ICD-10-CM

## 2017-07-12 DIAGNOSIS — M545 Low back pain, unspecified: Secondary | ICD-10-CM

## 2017-07-12 DIAGNOSIS — G894 Chronic pain syndrome: Secondary | ICD-10-CM

## 2017-07-12 MED ORDER — HYDROMORPHONE HCL 2 MG PO TABS: 2.0000 mg | ORAL_TABLET | ORAL | 0 refills | Status: DC | PRN

## 2017-07-12 MED ORDER — VITAMIN B-12 1000 MCG PO TABS
1000.0000 ug | ORAL_TABLET | Freq: Every day | ORAL | 2 refills | Status: DC
Start: 2017-07-12 — End: 2017-10-03

## 2017-07-12 NOTE — Telephone Encounter (Signed)
Kerri at [facility] called and wanted to know if we could refill his Dilaudid as they do not RX this but will get the pt back in office to discuss other intervention with pt.  Kerri CB# - [phone]

## 2017-07-12 NOTE — Progress Notes (Signed)
Subjective:    Patient ID: [name], male    DOB: Mar 23, 1966, 52 y.o.   MRN: [id number]  HPI Please see my previous office visits.  Patient has been diagnosed with hematuria/loin pain syndrome.  I have referred the patient to a nephrologist.  Nephrology performed an extensive workup due to chronic kidney disease and proteinuria.  They agree with the diagnosis and recommended a pain clinic.  Their workup did reveal B12 deficiency with a vitamin B12 level of 179.  The patient has seen a pain clinic.  The recommended splanchic nerve block.  However the patient has tried and failed that in the past.  I have reviewed the note from the pain clinic.  Apparently there was miscommunication.  After the patient declined the nerve block or was told that the nerve block is too risky (not entirely clear), he was told to follow-up with his primary care provider regarding his pain.  I explained to the patient that that was the entire reason I sent him to a pain clinic was because of his chronic pain.  He states that they will not prescribe the medication for him and he is extremely frustrated.  I then called the pain clinic personally.  They stated that they had not refused to prescribe pain medication but that the patient had declined pain medication at his last visit.  They would be willing to prescribe this moving forward however he would need to follow-up with them. Past Medical History:  Diagnosis Date  . History of Doppler ultrasound    Carotid US 11/17: Stable 1-39% bilateral ICA stenosis; Normal subclavian arteries, bilaterally.  Marland Kitchen History of echocardiogram    Echo 11/17: EF 60-65, normal wall motion, grade 1 diastolic dysfunction, aortic sclerosis without stenosis, trivial MR, normal RVSF, moderate RAE  . Hyperlipidemia 02/21/14   diet controlled, no medications  . Left acetabular fracture ([place])    nonoperative around age 67  . Stroke ([place]) 02/21/14   TIA - no limitaions from stroke  . Ulcer    Hx -  no longer a problem per patient   Past Surgical History:  Procedure Laterality Date  . colonscopy    . COSMETIC SURGERY     Dermal tatoo removal face from accident  . ESOPHAGOGASTRODUODENOSCOPY N/A 08/07/2016   Procedure: ESOPHAGOGASTRODUODENOSCOPY (EGD);  Surgeon: Manus Gunning, MD;  Location: [facility];  Service: Gastroenterology;  Laterality: N/A;  . KNEE ARTHROSCOPY W/ MENISCAL REPAIR     Both knees  . RIGHT/LEFT HEART CATH AND CORONARY ANGIOGRAPHY N/A 08/01/2016   Procedure: Right/Left Heart Cath and Coronary Angiography;  Surgeon: Sherren Mocha, MD;  Location: [facility];  Service: Cardiovascular;  Laterality: N/A;  . TONSILLECTOMY    . UPPER GASTROINTESTINAL ENDOSCOPY     hx ulcer, no longer a problem per patient  . WISDOM TOOTH EXTRACTION     Current Outpatient Medications on File Prior to Visit  Medication Sig Dispense Refill  . aspirin EC 81 MG tablet Take 1 tablet (81 mg total) by mouth daily. 90 tablet 3  . benazepril (LOTENSIN) 40 MG tablet Take 40 mg by mouth.    . Cholecalciferol (VITAMIN D3) 2000 units TABS Take by mouth.    . furosemide (LASIX) 40 MG tablet Take 40 mg by mouth 2 (two) times daily.    . furosemide (LASIX) 40 MG tablet TAKE ONE AND ONE-HALF TABLETS DAILY 135 tablet 3  . KRILL OIL PO Take by mouth.    . loratadine-pseudoephedrine (CLARITIN-D  12-HOUR) 5-120 MG tablet Take 1 tablet by mouth 2 (two) times daily.    . metoprolol succinate (TOPROL-XL) 50 MG 24 hr tablet TAKE 1 TABLET DAILY WITH OR IMMEDIATELY FOLLOWING A MEAL 90 tablet 3  . naproxen sodium (ALEVE) 220 MG tablet Take 220 mg by mouth.    Marland Kitchen oxymetazoline (AFRIN) 0.05 % nasal spray Place 1 spray into both nostrils 2 (two) times daily.    . pantoprazole (PROTONIX) 40 MG tablet Take 40 mg by mouth 2 (two) times daily.    Marland Kitchen Phenylephrine-APAP-Guaifenesin (TYLENOL SINUS SEVERE PO) Take by mouth.    . promethazine (PHENERGAN) 25 MG tablet Take 1 tablet (25 mg total) by mouth every 8  (eight) hours as needed for nausea or vomiting. 20 tablet 0  . HYDROmorphone (DILAUDID) 2 MG tablet Take 1 tablet (2 mg total) by mouth every 4 (four) hours as needed for severe pain. (Patient not taking: Reported on 07/12/2017) 30 tablet 0  . nitroGLYCERIN (NITROSTAT) 0.4 MG SL tablet Place 1 tablet (0.4 mg total) under the tongue every 5 (five) minutes as needed for chest pain. 25 tablet 1  . potassium chloride SA (KLOR-CON M20) 20 MEQ tablet Take 1.5 tablets (30 mEq total) by mouth daily. 135 tablet 3   No current facility-administered medications on file prior to visit.    Allergies  Allergen Reactions  . Demerol [Meperidine] Anaphylaxis  . Oxycodone Rash   Social History   Socioeconomic History  . Marital status: Single    Spouse name: Not on file  . Number of children: Not on file  . Years of education: Not on file  . Highest education level: Not on file  Social Needs  . Financial resource strain: Not on file  . Food insecurity - worry: Not on file  . Food insecurity - inability: Not on file  . Transportation needs - medical: Not on file  . Transportation needs - non-medical: Not on file  Occupational History  . Occupation: Radiation protection practitioner     Comment: at [employer]  . Smoking status: Former Smoker    Packs/day: 2.00    Types: Cigarettes    Start date: 07/09/1981    Last attempt to quit: 07/10/1999    Years since quitting: 18.0  . Smokeless tobacco: Never Used  Substance and Sexual Activity  . Alcohol use: No    Alcohol/week: 0.0 oz  . Drug use: No    Comment: Mistake made in chart pt has never used meth.  . Sexual activity: No    Birth control/protection: Abstinence    Comment: gay but not sexually active since ~ 2008  Other Topics Concern  . Not on file  Social History Narrative   Previous Armed forces logistics/support/administrative officer.       Review of Systems  All other systems reviewed and are negative.      Objective:   Physical Exam  Constitutional: He  appears well-developed and well-nourished.  Cardiovascular: Normal rate, regular rhythm and normal heart sounds.  Pulmonary/Chest: Effort normal and breath sounds normal.  Vitals reviewed.         Assessment & Plan:  Loin pain hematuria syndrome  Chronic pain syndrome  B12 deficiency  Recommended the patient try oral B12 replacement with vitamin B12 1000 mcg p.o. daily and recheck a B12 level in 3 months.  If levels still show deficiency, we could switch to parenteral injections at that time.  Regarding his chronic pain, I declined to refill his pain  medication because he has an established contract with his pain clinic.  Patient was instructed to follow-up with his [facility] clinic who will assume management of his chronic pain.  I have verify that the pain clinic will assume his care if he calls and schedules a follow-up.  Patient understands and will schedule as soon as he leaves this appointment

## 2017-07-24 DIAGNOSIS — M545 Low back pain: Secondary | ICD-10-CM | POA: Diagnosis not present

## 2017-07-24 DIAGNOSIS — R109 Unspecified abdominal pain: Secondary | ICD-10-CM | POA: Diagnosis not present

## 2017-07-24 DIAGNOSIS — G894 Chronic pain syndrome: Secondary | ICD-10-CM | POA: Diagnosis not present

## 2017-07-25 DIAGNOSIS — G8929 Other chronic pain: Secondary | ICD-10-CM | POA: Insufficient documentation

## 2017-07-25 DIAGNOSIS — G894 Chronic pain syndrome: Secondary | ICD-10-CM | POA: Insufficient documentation

## 2017-08-14 DIAGNOSIS — G8929 Other chronic pain: Secondary | ICD-10-CM | POA: Diagnosis not present

## 2017-08-14 DIAGNOSIS — G894 Chronic pain syndrome: Secondary | ICD-10-CM | POA: Diagnosis not present

## 2017-08-14 DIAGNOSIS — I110 Hypertensive heart disease with heart failure: Secondary | ICD-10-CM | POA: Diagnosis not present

## 2017-08-14 DIAGNOSIS — R109 Unspecified abdominal pain: Secondary | ICD-10-CM | POA: Diagnosis not present

## 2017-08-21 DIAGNOSIS — M549 Dorsalgia, unspecified: Secondary | ICD-10-CM | POA: Diagnosis not present

## 2017-08-21 DIAGNOSIS — M545 Low back pain: Secondary | ICD-10-CM | POA: Diagnosis not present

## 2017-08-21 DIAGNOSIS — R109 Unspecified abdominal pain: Secondary | ICD-10-CM | POA: Diagnosis not present

## 2017-09-11 DIAGNOSIS — Z0000 Encounter for general adult medical examination without abnormal findings: Secondary | ICD-10-CM | POA: Diagnosis not present

## 2017-09-11 DIAGNOSIS — I509 Heart failure, unspecified: Secondary | ICD-10-CM | POA: Diagnosis not present

## 2017-09-11 DIAGNOSIS — G8929 Other chronic pain: Secondary | ICD-10-CM | POA: Diagnosis not present

## 2017-09-18 DIAGNOSIS — R109 Unspecified abdominal pain: Secondary | ICD-10-CM | POA: Diagnosis not present

## 2017-10-03 ENCOUNTER — Other Ambulatory Visit: Payer: Self-pay | Admitting: Family Medicine

## 2017-10-03 ENCOUNTER — Ambulatory Visit: Payer: Commercial Managed Care - PPO | Admitting: Adult Health

## 2017-10-16 ENCOUNTER — Encounter: Payer: Self-pay | Admitting: Adult Health

## 2017-10-16 DIAGNOSIS — R109 Unspecified abdominal pain: Secondary | ICD-10-CM | POA: Diagnosis not present

## 2017-10-17 ENCOUNTER — Ambulatory Visit (INDEPENDENT_AMBULATORY_CARE_PROVIDER_SITE_OTHER): Payer: Commercial Managed Care - PPO | Admitting: Adult Health

## 2017-10-17 ENCOUNTER — Encounter: Payer: Self-pay | Admitting: Adult Health

## 2017-10-17 VITALS — BP 143/89 | HR 78 | Ht 74.5 in | Wt 361.0 lb

## 2017-10-17 DIAGNOSIS — G4733 Obstructive sleep apnea (adult) (pediatric): Secondary | ICD-10-CM | POA: Diagnosis not present

## 2017-10-17 DIAGNOSIS — Z9989 Dependence on other enabling machines and devices: Secondary | ICD-10-CM

## 2017-10-17 NOTE — Progress Notes (Addendum)
PATIENT: [name] DOB: [birth year]  REASON FOR VISIT: follow up HISTORY FROM: patient  HISTORY OF PRESENT ILLNESS: Today 10/17/17:  [name] is a 52 year old male with a history of obstructive sleep apnea on CPAP.  His download indicates that he uses machine 30 out of 30 days for compliance of 100%.  He uses machine greater than 4 hours each night.  On average he uses his machine 8 hours and 25 minutes.  His residual AHI is 1.3 on 9 cm of water with EPR of 3.  He does not have a significant leak.  He states that his machine continues to work well for him.  He is no longer waking up with headaches or feeling disoriented.  He does state that since he started using the CPAP he is congested.  He is using the nasal pillows.  Reports that he is unable to try any other mask due to claustrophobia.  He returns today for an evaluation.  HISTORY 04/05/2017: I reviewed his CPAP compliance data from 03/04/2017 through 04/02/2017, which is a total of 30 days, during which time he used his CPAP 29 days with percent used days greater than 4 hours at 97%, indicating excellent compliance with an average usage of 8 hours and 50 minutes, residual AHI at goal at 1.3 per hour, leak on the higher end with the 95th percentile at 19.7 L/m on a pressure of 9 cm with EPR of 3. He reports doing better, using nasal pillows. No more AM AHs. He sleeps well. He has a hx of hematuria, no night to night nocturia.     REVIEW OF SYSTEMS: Out of a complete 14 system review of symptoms, the patient complains only of the following symptoms, and all other reviewed systems are negative.  Appetite change, fatigue, unexpected weight change, excessive sweating, excessive eating, cough, shortness of breath, chest tightness, leg swelling, constipation, diarrhea, nausea, vomiting, apnea, daytime sleepiness, aching muscles, muscle cramps, urine decrease, bloody urine  ALLERGIES: Allergies  Allergen Reactions  . Demerol [Meperidine]  Anaphylaxis  . Oxycodone Rash    HOME MEDICATIONS: Outpatient Medications Prior to Visit  Medication Sig Dispense Refill  . aspirin EC 81 MG tablet Take 1 tablet (81 mg total) by mouth daily. 90 tablet 3  . benazepril (LOTENSIN) 40 MG tablet Take 40 mg by mouth.    . Cholecalciferol (VITAMIN D3) 2000 units TABS Take by mouth.    . CVS VITAMIN B12 1000 MCG tablet TAKE 1 TABLET BY MOUTH EVERY DAY 30 tablet 11  . furosemide (LASIX) 40 MG tablet Take 40 mg by mouth 2 (two) times daily.    . furosemide (LASIX) 40 MG tablet TAKE ONE AND ONE-HALF TABLETS DAILY 135 tablet 3  . HYDROmorphone (DILAUDID) 2 MG tablet Take 1 tablet (2 mg total) by mouth every 4 (four) hours as needed for severe pain. 30 tablet 0  . KRILL OIL PO Take by mouth.    . loratadine-pseudoephedrine (CLARITIN-D 12-HOUR) 5-120 MG tablet Take 1 tablet by mouth 2 (two) times daily.    . metoprolol succinate (TOPROL-XL) 50 MG 24 hr tablet TAKE 1 TABLET DAILY WITH OR IMMEDIATELY FOLLOWING A MEAL 90 tablet 3  . naproxen sodium (ALEVE) 220 MG tablet Take 220 mg by mouth.    . nitroGLYCERIN (NITROSTAT) 0.4 MG SL tablet Place 1 tablet (0.4 mg total) under the tongue every 5 (five) minutes as needed for chest pain. 25 tablet 1  . oxymetazoline (AFRIN) 0.05 % nasal spray Place  1 spray into both nostrils 2 (two) times daily.    . pantoprazole (PROTONIX) 40 MG tablet Take 40 mg by mouth 2 (two) times daily.    Marland Kitchen Phenylephrine-APAP-Guaifenesin (TYLENOL SINUS SEVERE PO) Take by mouth.    . potassium chloride SA (KLOR-CON M20) 20 MEQ tablet Take 1.5 tablets (30 mEq total) by mouth daily. 135 tablet 3  . promethazine (PHENERGAN) 25 MG tablet Take 1 tablet (25 mg total) by mouth every 8 (eight) hours as needed for nausea or vomiting. 20 tablet 0   No facility-administered medications prior to visit.     PAST MEDICAL HISTORY: Past Medical History:  Diagnosis Date  . History of Doppler ultrasound    Carotid US 11/17: Stable 1-39% bilateral  ICA stenosis; Normal subclavian arteries, bilaterally.  Marland Kitchen History of echocardiogram    Echo 11/17: EF 60-65, normal wall motion, grade 1 diastolic dysfunction, aortic sclerosis without stenosis, trivial MR, normal RVSF, moderate RAE  . Hyperlipidemia 02/21/14   diet controlled, no medications  . Left acetabular fracture ([place])    nonoperative around age 29  . Stroke ([place]) 02/21/14   TIA - no limitaions from stroke  . Ulcer    Hx - no longer a problem per patient    PAST SURGICAL HISTORY: Past Surgical History:  Procedure Laterality Date  . colonscopy    . COSMETIC SURGERY     Dermal tatoo removal face from accident  . ESOPHAGOGASTRODUODENOSCOPY N/A 08/07/2016   Procedure: ESOPHAGOGASTRODUODENOSCOPY (EGD);  Surgeon: Manus Gunning, MD;  Location: [facility];  Service: Gastroenterology;  Laterality: N/A;  . KNEE ARTHROSCOPY W/ MENISCAL REPAIR     Both knees  . RIGHT/LEFT HEART CATH AND CORONARY ANGIOGRAPHY N/A 08/01/2016   Procedure: Right/Left Heart Cath and Coronary Angiography;  Surgeon: Sherren Mocha, MD;  Location: [facility];  Service: Cardiovascular;  Laterality: N/A;  . TONSILLECTOMY    . UPPER GASTROINTESTINAL ENDOSCOPY     hx ulcer, no longer a problem per patient  . WISDOM TOOTH EXTRACTION      FAMILY HISTORY: Family History  Problem Relation Age of Onset  . Hyperlipidemia Mother   . Hypertension Mother   . Heart disease Father   . Hyperlipidemia Father   . Hypertension Father   . Heart attack Father   . Heart disease Sister   . Hyperlipidemia Sister   . Hypertension Sister   . Hyperlipidemia Brother   . Hypertension Brother   . Diabetes Brother   . Colon polyps Brother   . Cancer Maternal Grandmother   . Hearing loss Maternal Grandmother   . Hypertension Maternal Grandmother   . Esophageal cancer Maternal Grandmother   . Cancer Maternal Grandfather   . Colon cancer Maternal Grandfather   . Cancer Paternal Grandmother   . Heart disease  Paternal Grandmother   . Hypertension Paternal Grandmother   . Cancer Paternal Grandfather   . Diabetes Brother   . Heart disease Brother   . Hyperlipidemia Brother   . Hypertension Brother   . Colon cancer Maternal Uncle   . Rectal cancer Neg Hx   . Stomach cancer Neg Hx     SOCIAL HISTORY: Social History   Socioeconomic History  . Marital status: Single    Spouse name: Not on file  . Number of children: Not on file  . Years of education: Not on file  . Highest education level: Not on file  Occupational History  . Occupation: Radiation protection practitioner     Comment: at  [employer]  Social Needs  . Financial resource strain: Not on file  . Food insecurity:    Worry: Not on file    Inability: Not on file  . Transportation needs:    Medical: Not on file    Non-medical: Not on file  Tobacco Use  . Smoking status: Former Smoker    Packs/day: 2.00    Types: Cigarettes    Start date: 07/09/1981    Last attempt to quit: 07/10/1999    Years since quitting: 18.2  . Smokeless tobacco: Never Used  Substance and Sexual Activity  . Alcohol use: No    Alcohol/week: 0.0 oz  . Drug use: No    Comment: Mistake made in chart pt has never used meth.  . Sexual activity: Never    Birth control/protection: Abstinence    Comment: gay but not sexually active since ~ 2008  Lifestyle  . Physical activity:    Days per week: Not on file    Minutes per session: Not on file  . Stress: Not on file  Relationships  . Social connections:    Talks on phone: Not on file    Gets together: Not on file    Attends religious service: Not on file    Active member of club or organization: Not on file    Attends meetings of clubs or organizations: Not on file    Relationship status: Not on file  . Intimate partner violence:    Fear of current or ex partner: Not on file    Emotionally abused: Not on file    Physically abused: Not on file    Forced sexual activity: Not on file  Other Topics Concern  .  Not on file  Social History Narrative   Previous Armed forces logistics/support/administrative officer.       PHYSICAL EXAM  Vitals:   10/17/17 1021  BP: (!) 143/89  Pulse: 78  Weight: (!) 361 lb (163.7 kg)  Height: 6' 2.5" (1.892 m)   Body mass index is 45.73 kg/m.  Generalized: Well developed, in no acute distress   Neurological examination  Mentation: Alert oriented to time, place, history taking. Follows all commands speech and language fluent Cranial nerve II-XII: Pupils were equal round reactive to light. Extraocular movements were full, visual field were full on confrontational test. Facial sensation and strength were normal. Uvula tongue midline. Head turning and shoulder shrug  were normal and symmetric. Motor: The motor testing reveals 5 over 5 strength of all 4 extremities. Good symmetric motor tone is noted throughout.  Sensory: Sensory testing is intact to soft touch on all 4 extremities. No evidence of extinction is noted.  Coordination: Cerebellar testing reveals good finger-nose-finger and heel-to-shin bilaterally.   Reflexes: Deep tendon reflexes are symmetric and normal bilaterally.   DIAGNOSTIC DATA (LABS, IMAGING, TESTING) - I reviewed patient records, labs, notes, testing and imaging myself where available.  Lab Results  Component Value Date   WBC 7.6 12/14/2016   HGB 14.7 12/14/2016   HCT 42.5 12/14/2016   MCV 85.9 12/14/2016   PLT 246 12/14/2016      Component Value Date/Time   NA 143 04/05/2017 1613   NA 142 11/20/2016 0828   K 3.7 04/05/2017 1613   CL 102 04/05/2017 1613   CO2 26 04/05/2017 1613   GLUCOSE 95 04/05/2017 1613   BUN 18 04/05/2017 1613   BUN 22 11/20/2016 0828   CREATININE 1.27 04/05/2017 1613   CALCIUM 10.2 04/05/2017 1613   PROT  7.5 12/14/2016 1159   ALBUMIN 4.3 12/14/2016 1159   AST 19 12/14/2016 1159   ALT 22 12/14/2016 1159   ALKPHOS 58 12/14/2016 1159   BILITOT 0.5 12/14/2016 1159   GFRNONAA 64 04/05/2017 1613   GFRAA 74 04/05/2017 1613   Lab  Results  Component Value Date   CHOL 154 01/20/2016   HDL 43 01/20/2016   LDLCALC 95 01/20/2016   TRIG 79 01/20/2016   CHOLHDL 3.6 01/20/2016   Lab Results  Component Value Date   HGBA1C 5.8 (H) 06/08/2014     ASSESSMENT AND PLAN 52 y.o. year old male  has a past medical history of History of Doppler ultrasound, History of echocardiogram, Hyperlipidemia (02/21/14), Left acetabular fracture ([facility]), Stroke ([facility]) (02/21/14), and Ulcer. here with :  1.  Obstructive sleep apnea on CPAP  The patient CPAP download shows excellent compliance and good treatment of his apnea.  He is encouraged to continue using the CPAP nightly.  I have advised that we could potentially try a different style of mask to see if congestion improves but the patient deferred.  I advised that he could also see ENT for congestion.  Patient voiced understanding.  He will follow-up in 1 year or sooner if needed.   I spent 15 minutes with the patient. 50% of this time was spent discussing his CPAP download   Ward Givens, MSN, NP-C 10/17/2017, 10:12 AM [facility] [address] [place], [place] [address] [phone]  I reviewed the above note and documentation by the Nurse Practitioner and agree with the history, physical exam, assessment and plan as outlined above. I was immediately available for face-to-face consultation. Star Age, MD, PhD [facility])

## 2017-10-17 NOTE — Patient Instructions (Signed)
Your Plan:  Continue using CPAP nightly and >4 hours each night If your symptoms worsen or you develop new symptoms please let us know.   Thank you for coming to see us at [facility]. I hope we have been able to provide you high quality care today.  You may receive a patient satisfaction survey over the next few weeks. We would appreciate your feedback and comments so that we may continue to improve ourselves and the health of our patients.  

## 2017-10-23 DIAGNOSIS — G4733 Obstructive sleep apnea (adult) (pediatric): Secondary | ICD-10-CM | POA: Diagnosis not present

## 2017-11-13 DIAGNOSIS — R109 Unspecified abdominal pain: Secondary | ICD-10-CM | POA: Diagnosis not present

## 2017-11-16 DIAGNOSIS — R319 Hematuria, unspecified: Secondary | ICD-10-CM | POA: Diagnosis not present

## 2017-11-16 DIAGNOSIS — I130 Hypertensive heart and chronic kidney disease with heart failure and stage 1 through stage 4 chronic kidney disease, or unspecified chronic kidney disease: Secondary | ICD-10-CM | POA: Diagnosis not present

## 2017-11-16 DIAGNOSIS — G4733 Obstructive sleep apnea (adult) (pediatric): Secondary | ICD-10-CM | POA: Diagnosis not present

## 2017-11-28 DIAGNOSIS — N398 Other specified disorders of urinary system: Secondary | ICD-10-CM | POA: Diagnosis not present

## 2017-12-11 DIAGNOSIS — R109 Unspecified abdominal pain: Secondary | ICD-10-CM | POA: Diagnosis not present

## 2017-12-20 DIAGNOSIS — I110 Hypertensive heart disease with heart failure: Secondary | ICD-10-CM | POA: Diagnosis not present

## 2017-12-20 DIAGNOSIS — G4733 Obstructive sleep apnea (adult) (pediatric): Secondary | ICD-10-CM | POA: Diagnosis not present

## 2017-12-20 DIAGNOSIS — G894 Chronic pain syndrome: Secondary | ICD-10-CM | POA: Diagnosis not present

## 2017-12-21 DIAGNOSIS — Z8673 Personal history of transient ischemic attack (TIA), and cerebral infarction without residual deficits: Secondary | ICD-10-CM | POA: Diagnosis not present

## 2017-12-28 DIAGNOSIS — Z8673 Personal history of transient ischemic attack (TIA), and cerebral infarction without residual deficits: Secondary | ICD-10-CM | POA: Diagnosis not present

## 2018-01-12 ENCOUNTER — Other Ambulatory Visit: Payer: Self-pay | Admitting: Physician Assistant

## 2018-01-16 DIAGNOSIS — G894 Chronic pain syndrome: Secondary | ICD-10-CM | POA: Diagnosis not present

## 2018-01-25 DIAGNOSIS — R319 Hematuria, unspecified: Secondary | ICD-10-CM | POA: Diagnosis not present

## 2018-01-25 DIAGNOSIS — M545 Low back pain: Secondary | ICD-10-CM | POA: Diagnosis not present

## 2018-01-31 DIAGNOSIS — I5033 Acute on chronic diastolic (congestive) heart failure: Secondary | ICD-10-CM | POA: Diagnosis not present

## 2018-01-31 DIAGNOSIS — I110 Hypertensive heart disease with heart failure: Secondary | ICD-10-CM | POA: Diagnosis not present

## 2018-01-31 DIAGNOSIS — R002 Palpitations: Secondary | ICD-10-CM | POA: Diagnosis not present

## 2018-01-31 DIAGNOSIS — I509 Heart failure, unspecified: Secondary | ICD-10-CM | POA: Diagnosis not present

## 2018-02-01 DIAGNOSIS — G4733 Obstructive sleep apnea (adult) (pediatric): Secondary | ICD-10-CM | POA: Diagnosis not present

## 2018-02-18 DIAGNOSIS — G894 Chronic pain syndrome: Secondary | ICD-10-CM | POA: Diagnosis not present

## 2018-02-26 DIAGNOSIS — R911 Solitary pulmonary nodule: Secondary | ICD-10-CM | POA: Diagnosis not present

## 2018-02-26 DIAGNOSIS — R161 Splenomegaly, not elsewhere classified: Secondary | ICD-10-CM | POA: Diagnosis not present

## 2018-03-06 DIAGNOSIS — Z8673 Personal history of transient ischemic attack (TIA), and cerebral infarction without residual deficits: Secondary | ICD-10-CM | POA: Insufficient documentation

## 2018-03-06 DIAGNOSIS — K219 Gastro-esophageal reflux disease without esophagitis: Secondary | ICD-10-CM | POA: Insufficient documentation

## 2018-03-11 DIAGNOSIS — Z8673 Personal history of transient ischemic attack (TIA), and cerebral infarction without residual deficits: Secondary | ICD-10-CM | POA: Diagnosis not present

## 2018-03-11 DIAGNOSIS — Z7982 Long term (current) use of aspirin: Secondary | ICD-10-CM | POA: Diagnosis not present

## 2018-03-11 DIAGNOSIS — Z7902 Long term (current) use of antithrombotics/antiplatelets: Secondary | ICD-10-CM | POA: Diagnosis not present

## 2018-03-14 DIAGNOSIS — Z8673 Personal history of transient ischemic attack (TIA), and cerebral infarction without residual deficits: Secondary | ICD-10-CM | POA: Diagnosis not present

## 2018-03-14 DIAGNOSIS — I517 Cardiomegaly: Secondary | ICD-10-CM | POA: Diagnosis not present

## 2018-03-15 DIAGNOSIS — N182 Chronic kidney disease, stage 2 (mild): Secondary | ICD-10-CM | POA: Diagnosis not present

## 2018-03-15 DIAGNOSIS — I129 Hypertensive chronic kidney disease with stage 1 through stage 4 chronic kidney disease, or unspecified chronic kidney disease: Secondary | ICD-10-CM | POA: Diagnosis not present

## 2018-03-15 DIAGNOSIS — N398 Other specified disorders of urinary system: Secondary | ICD-10-CM | POA: Diagnosis not present

## 2018-03-19 DIAGNOSIS — G894 Chronic pain syndrome: Secondary | ICD-10-CM | POA: Diagnosis not present

## 2018-04-11 DIAGNOSIS — Z136 Encounter for screening for cardiovascular disorders: Secondary | ICD-10-CM | POA: Diagnosis not present

## 2018-04-16 DIAGNOSIS — N182 Chronic kidney disease, stage 2 (mild): Secondary | ICD-10-CM | POA: Diagnosis not present

## 2018-04-16 DIAGNOSIS — R310 Gross hematuria: Secondary | ICD-10-CM | POA: Diagnosis not present

## 2018-04-16 DIAGNOSIS — I129 Hypertensive chronic kidney disease with stage 1 through stage 4 chronic kidney disease, or unspecified chronic kidney disease: Secondary | ICD-10-CM | POA: Diagnosis not present

## 2018-04-18 DIAGNOSIS — R310 Gross hematuria: Secondary | ICD-10-CM | POA: Diagnosis not present

## 2018-04-22 DIAGNOSIS — I110 Hypertensive heart disease with heart failure: Secondary | ICD-10-CM | POA: Diagnosis not present

## 2018-04-22 DIAGNOSIS — N029 Recurrent and persistent hematuria with unspecified morphologic changes: Secondary | ICD-10-CM | POA: Diagnosis not present

## 2018-04-22 DIAGNOSIS — N23 Unspecified renal colic: Secondary | ICD-10-CM | POA: Diagnosis not present

## 2018-04-24 DIAGNOSIS — I5032 Chronic diastolic (congestive) heart failure: Secondary | ICD-10-CM | POA: Diagnosis not present

## 2018-04-24 DIAGNOSIS — R9431 Abnormal electrocardiogram [ECG] [EKG]: Secondary | ICD-10-CM | POA: Diagnosis not present

## 2018-04-24 DIAGNOSIS — I10 Essential (primary) hypertension: Secondary | ICD-10-CM | POA: Diagnosis not present

## 2018-04-24 DIAGNOSIS — E669 Obesity, unspecified: Secondary | ICD-10-CM | POA: Diagnosis not present

## 2018-05-09 DIAGNOSIS — I129 Hypertensive chronic kidney disease with stage 1 through stage 4 chronic kidney disease, or unspecified chronic kidney disease: Secondary | ICD-10-CM | POA: Diagnosis not present

## 2018-05-14 DIAGNOSIS — G4733 Obstructive sleep apnea (adult) (pediatric): Secondary | ICD-10-CM | POA: Diagnosis not present

## 2018-05-14 DIAGNOSIS — G894 Chronic pain syndrome: Secondary | ICD-10-CM | POA: Diagnosis not present

## 2018-06-11 ENCOUNTER — Other Ambulatory Visit: Payer: Self-pay | Admitting: Physician Assistant

## 2018-06-11 NOTE — Telephone Encounter (Signed)
Pharmacy is requesting furosemide 40mg  one and a half tablets daily; we have on file 40mg  two times daily. Please address. Thank you

## 2018-06-24 NOTE — Telephone Encounter (Signed)
Pt has not been seen since 11/2016.  He needs appt with his cardiologist please arrange.

## 2018-06-26 DIAGNOSIS — G894 Chronic pain syndrome: Secondary | ICD-10-CM | POA: Diagnosis not present

## 2018-06-26 DIAGNOSIS — I509 Heart failure, unspecified: Secondary | ICD-10-CM | POA: Diagnosis not present

## 2018-06-26 DIAGNOSIS — I110 Hypertensive heart disease with heart failure: Secondary | ICD-10-CM | POA: Diagnosis not present

## 2018-07-04 IMAGING — NM NM MISC PROCEDURE
3 series · 18 of 18 positions shown · non-contrast
Comparison: none

[Series 1: rest_(id)_sa · 6.4mm · 6.40mm/px · 6 of 64 frames shown]
[frame 6/64]
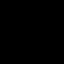
[frame 16/64]
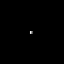
[frame 27/64]
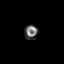
[frame 38/64]
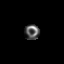
[frame 48/64]
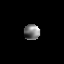
[frame 59/64]
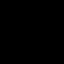

[Series 1: stress-gsp_(id)_sa · 6.4mm · 6.40mm/px · 6 of 512 frames shown]
[frame 43/512]
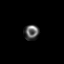
[frame 128/512]
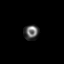
[frame 214/512]
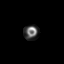
[frame 299/512]
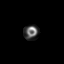
[frame 384/512]
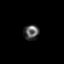
[frame 470/512]
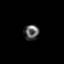

[Series 1: stress-sum-em_(id)_sa · 6.4mm · 6.40mm/px · 6 of 64 frames shown]
[frame 6/64]
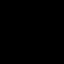
[frame 16/64]
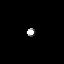
[frame 27/64]
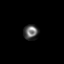
[frame 38/64]
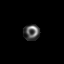
[frame 48/64]
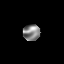
[frame 59/64]
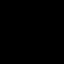

[18 of 18 positions shown; findings below may reference images not displayed]

Canned report from images found in remote index.

Refer to host system for actual result text.

## 2018-07-19 DIAGNOSIS — G894 Chronic pain syndrome: Secondary | ICD-10-CM | POA: Diagnosis not present

## 2018-07-23 DIAGNOSIS — Z0189 Encounter for other specified special examinations: Secondary | ICD-10-CM | POA: Diagnosis not present

## 2018-07-23 DIAGNOSIS — K921 Melena: Secondary | ICD-10-CM | POA: Diagnosis not present

## 2018-07-23 DIAGNOSIS — K5900 Constipation, unspecified: Secondary | ICD-10-CM | POA: Diagnosis not present

## 2018-07-29 DIAGNOSIS — Z8673 Personal history of transient ischemic attack (TIA), and cerebral infarction without residual deficits: Secondary | ICD-10-CM | POA: Diagnosis not present

## 2018-07-29 DIAGNOSIS — Z7982 Long term (current) use of aspirin: Secondary | ICD-10-CM | POA: Diagnosis not present

## 2018-07-29 DIAGNOSIS — R4789 Other speech disturbances: Secondary | ICD-10-CM | POA: Diagnosis not present

## 2018-08-02 DIAGNOSIS — N398 Other specified disorders of urinary system: Secondary | ICD-10-CM | POA: Diagnosis not present

## 2018-08-02 DIAGNOSIS — G8929 Other chronic pain: Secondary | ICD-10-CM | POA: Diagnosis not present

## 2018-08-05 DIAGNOSIS — R221 Localized swelling, mass and lump, neck: Secondary | ICD-10-CM | POA: Diagnosis not present

## 2018-08-05 DIAGNOSIS — Z0189 Encounter for other specified special examinations: Secondary | ICD-10-CM | POA: Diagnosis not present

## 2018-08-22 ENCOUNTER — Encounter: Payer: Self-pay | Admitting: Gastroenterology

## 2018-10-21 ENCOUNTER — Ambulatory Visit: Payer: Commercial Managed Care - PPO | Admitting: Adult Health

## 2020-09-27 DIAGNOSIS — Z0271 Encounter for disability determination: Secondary | ICD-10-CM

## 2021-02-17 ENCOUNTER — Other Ambulatory Visit: Payer: Self-pay

## 2021-02-17 ENCOUNTER — Encounter: Payer: Self-pay | Admitting: Gastroenterology

## 2021-02-17 ENCOUNTER — Telehealth: Payer: Self-pay

## 2021-02-17 ENCOUNTER — Ambulatory Visit: Payer: Commercial Managed Care - PPO | Admitting: Gastroenterology

## 2021-02-17 ENCOUNTER — Ambulatory Visit (INDEPENDENT_AMBULATORY_CARE_PROVIDER_SITE_OTHER): Payer: No Typology Code available for payment source | Admitting: Gastroenterology

## 2021-02-17 VITALS — BP 121/76 | HR 77 | Temp 98.6°F | Ht 74.5 in | Wt 311.4 lb

## 2021-02-17 DIAGNOSIS — Z135 Encounter for screening for eye and ear disorders: Secondary | ICD-10-CM | POA: Insufficient documentation

## 2021-02-17 DIAGNOSIS — Z8719 Personal history of other diseases of the digestive system: Secondary | ICD-10-CM | POA: Diagnosis not present

## 2021-02-17 DIAGNOSIS — K921 Melena: Secondary | ICD-10-CM | POA: Insufficient documentation

## 2021-02-17 DIAGNOSIS — IMO0002 Reserved for concepts with insufficient information to code with codable children: Secondary | ICD-10-CM | POA: Insufficient documentation

## 2021-02-17 DIAGNOSIS — G4730 Sleep apnea, unspecified: Secondary | ICD-10-CM

## 2021-02-17 DIAGNOSIS — E876 Hypokalemia: Secondary | ICD-10-CM | POA: Insufficient documentation

## 2021-02-17 DIAGNOSIS — I509 Heart failure, unspecified: Secondary | ICD-10-CM | POA: Insufficient documentation

## 2021-02-17 DIAGNOSIS — R55 Syncope and collapse: Secondary | ICD-10-CM | POA: Insufficient documentation

## 2021-02-17 DIAGNOSIS — L603 Nail dystrophy: Secondary | ICD-10-CM | POA: Insufficient documentation

## 2021-02-17 DIAGNOSIS — K2100 Gastro-esophageal reflux disease with esophagitis, without bleeding: Secondary | ICD-10-CM

## 2021-02-17 DIAGNOSIS — F0632 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: Secondary | ICD-10-CM | POA: Insufficient documentation

## 2021-02-17 DIAGNOSIS — M609 Myositis, unspecified: Secondary | ICD-10-CM | POA: Insufficient documentation

## 2021-02-17 DIAGNOSIS — F332 Major depressive disorder, recurrent severe without psychotic features: Secondary | ICD-10-CM | POA: Insufficient documentation

## 2021-02-17 DIAGNOSIS — K0252 Dental caries on pit and fissure surface penetrating into dentin: Secondary | ICD-10-CM | POA: Insufficient documentation

## 2021-02-17 DIAGNOSIS — Z86010 Personal history of colonic polyps: Secondary | ICD-10-CM | POA: Insufficient documentation

## 2021-02-17 DIAGNOSIS — N398 Other specified disorders of urinary system: Secondary | ICD-10-CM | POA: Insufficient documentation

## 2021-02-17 DIAGNOSIS — N200 Calculus of kidney: Secondary | ICD-10-CM | POA: Insufficient documentation

## 2021-02-17 DIAGNOSIS — R1310 Dysphagia, unspecified: Secondary | ICD-10-CM | POA: Insufficient documentation

## 2021-02-17 DIAGNOSIS — R61 Generalized hyperhidrosis: Secondary | ICD-10-CM | POA: Insufficient documentation

## 2021-02-17 DIAGNOSIS — R45851 Suicidal ideations: Secondary | ICD-10-CM | POA: Insufficient documentation

## 2021-02-17 DIAGNOSIS — E669 Obesity, unspecified: Secondary | ICD-10-CM | POA: Insufficient documentation

## 2021-02-17 DIAGNOSIS — R0602 Shortness of breath: Secondary | ICD-10-CM | POA: Insufficient documentation

## 2021-02-17 DIAGNOSIS — K635 Polyp of colon: Secondary | ICD-10-CM | POA: Insufficient documentation

## 2021-02-17 DIAGNOSIS — F339 Major depressive disorder, recurrent, unspecified: Secondary | ICD-10-CM | POA: Insufficient documentation

## 2021-02-17 DIAGNOSIS — R310 Gross hematuria: Secondary | ICD-10-CM | POA: Insufficient documentation

## 2021-02-17 HISTORY — DX: Sleep apnea, unspecified: G47.30

## 2021-02-17 NOTE — Telephone Encounter (Signed)
A blood thinner request was faxed to the [facility] for patient to hold his Plavix. Awaiting on Dr. Joslyn Devon response.

## 2021-02-17 NOTE — Progress Notes (Signed)
Jonathon Bellows MD, MRCP(U.K) [address]  [place]  [place], [place] [address]  Main: [phone]  Fax: [phone]   Gastroenterology Consultation  Referring Provider:     [facility] Primary Care Physician:  Rickard Rhymes, DO Primary Gastroenterologist:  Dr. Jonathon Bellows  Reason for Consultation:     Discuss prior endoscopy findings        HPI:   [name] is a 55 y.o. y/o male referred for consultation & management  by Dr. Garald Balding, Cleda Daub, DO.    Reviewed the patient's old records and he was seen by gastroenterologist in [place] back in 2018.  At that point of time he had just recovered from a gastrointestinal bleed when he was hospitalized and an EGD showed grade B esophagitis and a Schatzki's ring.  To clean based gastric ulcers were also noted along with antral gastritis.  Biopsies showed chronic inactive gastritis negative H. pylori.  Last colonoscopy was in 2017 tubular adenomas x3 3 to 5 mm in size and he was suggested to have a 5-year follow-up.  He is recently had a colonoscopy at [facility] and was told everything was good does not require 1 anytime soon.  He is on Protonix twice a day takes it before his meals still has breakthrough heartburn symptoms.  Denies any dysphagia.  Has not had a recent endoscopy.  He is on Plavix.  He already uses a wedge pillow at night.  He avoids eating for 2 to 3 hours before bedtime.  Has lost about 70 pounds of weight intentionally. Past Medical History:  Diagnosis Date   History of Doppler ultrasound    Carotid US 11/17: Stable 1-39% bilateral ICA stenosis; Normal subclavian arteries, bilaterally.   History of echocardiogram    Echo 11/17: EF 60-65, normal wall motion, grade 1 diastolic dysfunction, aortic sclerosis without stenosis, trivial MR, normal RVSF, moderate RAE   Hyperlipidemia 02/21/14   diet controlled, no medications   Left acetabular fracture ([facility])    nonoperative around age 27   Stroke  ([place]) 02/21/14   TIA - no limitaions from stroke   Ulcer    Hx - no longer a problem per patient    Past Surgical History:  Procedure Laterality Date   colonscopy     COSMETIC SURGERY     Dermal tatoo removal face from accident   ESOPHAGOGASTRODUODENOSCOPY N/A 08/07/2016   Procedure: ESOPHAGOGASTRODUODENOSCOPY (EGD);  Surgeon: Manus Gunning, MD;  Location: [facility];  Service: Gastroenterology;  Laterality: N/A;   KNEE ARTHROSCOPY W/ MENISCAL REPAIR     Both knees   RIGHT/LEFT HEART CATH AND CORONARY ANGIOGRAPHY N/A 08/01/2016   Procedure: Right/Left Heart Cath and Coronary Angiography;  Surgeon: Sherren Mocha, MD;  Location: [facility];  Service: Cardiovascular;  Laterality: N/A;   TONSILLECTOMY     UPPER GASTROINTESTINAL ENDOSCOPY     hx ulcer, no longer a problem per patient   WISDOM TOOTH EXTRACTION      Prior to Admission medications   Medication Sig Start Date End Date Taking? Authorizing Provider  aspirin EC 81 MG tablet Take 1 tablet (81 mg total) by mouth daily. 04/11/16   Richardson Dopp T, PA-C  benazepril (LOTENSIN) 40 MG tablet Take 40 mg by mouth. 02/04/16   [provider]  Cholecalciferol (VITAMIN D3) 2000 units TABS Take by mouth.    [provider]  [facility]S VITAMIN B12 1000 MCG tablet TAKE 1 TABLET BY MOUTH EVERY DAY 10/03/17  Susy Frizzle, MD  furosemide (LASIX) 40 MG tablet Take 40 mg by mouth 2 (two) times daily.    [provider]  HYDROmorphone (DILAUDID) 2 MG tablet Take 1 tablet (2 mg total) by mouth every 4 (four) hours as needed for severe pain. Patient taking differently: Take 2 mg by mouth 3 (three) times daily.  07/12/17   Susy Frizzle, MD  KRILL OIL PO Take by mouth.    [provider]  loratadine-pseudoephedrine (CLARITIN-D 12-HOUR) 5-120 MG tablet Take 1 tablet by mouth 2 (two) times daily.    [provider]  metoprolol succinate (TOPROL-XL) 50 MG 24 hr tablet TAKE 1 TABLET DAILY WITH OR  IMMEDIATELY FOLLOWING A MEAL 01/12/17   Susy Frizzle, MD  naproxen sodium (ALEVE) 220 MG tablet Take 220 mg by mouth.    [provider]  nitroGLYCERIN (NITROSTAT) 0.4 MG SL tablet Place 1 tablet (0.4 mg total) under the tongue every 5 (five) minutes as needed for chest pain. 07/14/16 04/05/17  Josue Hector, MD  oxymetazoline (AFRIN) 0.05 % nasal spray Place 1 spray into both nostrils 2 (two) times daily.    [provider]  pantoprazole (PROTONIX) 40 MG tablet Take 40 mg by mouth 2 (two) times daily.    [provider]  pantoprazole (PROTONIX) 40 MG tablet Take 1 tablet (40 mg total) by mouth 2 (two) times daily. 01/14/18   Milus Banister, MD  Phenylephrine-APAP-Guaifenesin (TYLENOL SINUS SEVERE PO) Take by mouth.    [provider]  potassium chloride SA (KLOR-CON M20) 20 MEQ tablet Take 1.5 tablets (30 mEq total) by mouth daily. 06/01/16 04/05/17  Richardson Dopp T, PA-C  promethazine (PHENERGAN) 12.5 MG tablet Take 12.5 mg by mouth every 6 (six) hours as needed. for nausea 09/07/17   [provider]  promethazine (PHENERGAN) 25 MG tablet Take 1 tablet (25 mg total) by mouth every 8 (eight) hours as needed for nausea or vomiting. 06/19/17   Susy Frizzle, MD    Family History  Problem Relation Age of Onset   Hyperlipidemia Mother    Hypertension Mother    Heart disease Father    Hyperlipidemia Father    Hypertension Father    Heart attack Father    Heart disease Sister    Hyperlipidemia Sister    Hypertension Sister    Hyperlipidemia Brother    Hypertension Brother    Diabetes Brother    Colon polyps Brother    Cancer Maternal Grandmother    Hearing loss Maternal Grandmother    Hypertension Maternal Grandmother    Esophageal cancer Maternal Grandmother    Cancer Maternal Grandfather    Colon cancer Maternal Grandfather    Cancer Paternal Grandmother    Heart disease Paternal Grandmother    Hypertension Paternal Grandmother     Cancer Paternal Grandfather    Diabetes Brother    Heart disease Brother    Hyperlipidemia Brother    Hypertension Brother    Colon cancer Maternal Uncle    Rectal cancer Neg Hx    Stomach cancer Neg Hx      Social History   Tobacco Use   Smoking status: Former    Packs/day: 2.00    Types: Cigarettes    Start date: 07/09/1981    Quit date: 07/10/1999    Years since quitting: 21.6   Smokeless tobacco: Never  Substance Use Topics   Alcohol use: No    Alcohol/week: 0.0 standard drinks   Drug  use: No    Comment: Mistake made in chart pt has never used meth.    Allergies as of 02/17/2021 - Review Complete 10/17/2017  Allergen Reaction Noted   Bupropion  02/04/2021   Demerol [meperidine] Anaphylaxis 07/09/2012   Fluoxetine Other (See Comments) 02/04/2021   Amitriptyline Other (See Comments) 05/19/2019   Oxycodone Rash 07/09/2012   Sucralfate Nausea Only, Other (See Comments), and Nausea And Vomiting 03/31/2020   Oxycodone-acetaminophen  02/20/2002    Review of Systems:    All systems reviewed and negative except where noted in HPI.   Physical Exam:  There were no vitals taken for this visit. No LMP for male patient. Psych:  Alert and cooperative. Normal mood and affect. General:   Alert,  Well-developed, well-nourished, pleasant and cooperative in NAD Head:  Normocephalic and atraumatic. Eyes:  Sclera clear, no icterus.   Conjunctiva pink. Ears:  Normal auditory acuity. Nose:  No deformity, discharge, or lesions. Lungs:  Respirations even and unlabored.  Clear throughout to auscultation.   No wheezes, crackles, or rhonchi. No acute distress. Heart:  Regular rate and rhythm; no murmurs, clicks, rubs, or gallops. Abdomen:  Normal bowel sounds.  No bruits.  Soft, non-tender and non-distended without masses, hepatosplenomegaly or hernias noted.  No guarding or rebound tenderness.    Neurologic:  Alert and oriented x3;  grossly normal neurologically. Psych:  Alert and  cooperative. Normal mood and affect.  Imaging Studies: No results found.  Assessment and Plan:   [name] is a 55 y.o. y/o male is here to see me for acid reflux.  He has symptoms despite being on maximum dose of Protonix 40 mg twice daily.With a history of esophagitis it is almost certain he has significant reflux.  In addition we will need to ensure he does not have Barrett's esophagus as he did not have an endoscopy to check for resolution of the inflammation.   Plan 1.  EGD to rule out Barrett's esophagus. 2.  Add famotidine daily at night.  If no better at next visit we will plan to change to [place].  And could consider impedance pH testing down the road and discussion about antireflux surgery.  I have discussed alternative options, risks & benefits,  which include, but are not limited to, bleeding, infection, perforation,respiratory complication & drug reaction.  The patient agrees with this plan & written consent will be obtained.    Follow up in 8 to 12-week  Dr Jonathon Bellows MD,MRCP(U.K)

## 2021-02-24 NOTE — Telephone Encounter (Signed)
Blood thinner request faxed again as STAT to the [facility]. Awaiting on response.

## 2021-03-08 ENCOUNTER — Encounter: Payer: Self-pay | Admitting: Gastroenterology

## 2021-03-08 ENCOUNTER — Encounter
Admission: RE | Disposition: A | Discharge status: Home / Self Care | Payer: Self-pay | Source: Ambulatory Visit | Attending: Gastroenterology

## 2021-03-08 ENCOUNTER — Ambulatory Visit: Payer: No Typology Code available for payment source

## 2021-03-08 ENCOUNTER — Ambulatory Visit
Admission: RE | Admit: 2021-03-08 | Discharge: 2021-03-08 | Disposition: A | Discharge status: Home / Self Care | Payer: No Typology Code available for payment source | Source: Ambulatory Visit | Attending: Gastroenterology | Admitting: Gastroenterology

## 2021-03-08 DIAGNOSIS — Z885 Allergy status to narcotic agent status: Secondary | ICD-10-CM | POA: Insufficient documentation

## 2021-03-08 DIAGNOSIS — Z79899 Other long term (current) drug therapy: Secondary | ICD-10-CM | POA: Insufficient documentation

## 2021-03-08 DIAGNOSIS — Z7902 Long term (current) use of antithrombotics/antiplatelets: Secondary | ICD-10-CM | POA: Diagnosis not present

## 2021-03-08 DIAGNOSIS — T18128A Food in esophagus causing other injury, initial encounter: Secondary | ICD-10-CM | POA: Diagnosis not present

## 2021-03-08 DIAGNOSIS — Z8719 Personal history of other diseases of the digestive system: Secondary | ICD-10-CM

## 2021-03-08 DIAGNOSIS — Z9109 Other allergy status, other than to drugs and biological substances: Secondary | ICD-10-CM | POA: Diagnosis not present

## 2021-03-08 DIAGNOSIS — Z87891 Personal history of nicotine dependence: Secondary | ICD-10-CM | POA: Diagnosis not present

## 2021-03-08 DIAGNOSIS — Z7984 Long term (current) use of oral hypoglycemic drugs: Secondary | ICD-10-CM | POA: Diagnosis not present

## 2021-03-08 DIAGNOSIS — K2100 Gastro-esophageal reflux disease with esophagitis, without bleeding: Secondary | ICD-10-CM | POA: Diagnosis not present

## 2021-03-08 DIAGNOSIS — Z87892 Personal history of anaphylaxis: Secondary | ICD-10-CM | POA: Diagnosis not present

## 2021-03-08 DIAGNOSIS — X58XXXA Exposure to other specified factors, initial encounter: Secondary | ICD-10-CM | POA: Insufficient documentation

## 2021-03-08 HISTORY — DX: Chronic kidney disease, unspecified: N18.9

## 2021-03-08 HISTORY — DX: Other complications of anesthesia, initial encounter: T88.59XA

## 2021-03-08 HISTORY — DX: Heart failure, unspecified: I50.9

## 2021-03-08 HISTORY — PX: ESOPHAGOGASTRODUODENOSCOPY (EGD) WITH PROPOFOL: SHX5813

## 2021-03-08 LAB — GLUCOSE, CAPILLARY: Glucose-Capillary: 210 mg/dL — ABNORMAL HIGH (ref 70–99)

## 2021-03-08 SURGERY — ESOPHAGOGASTRODUODENOSCOPY (EGD) WITH PROPOFOL
Anesthesia: General

## 2021-03-08 MED ORDER — LIDOCAINE HCL (CARDIAC) PF 100 MG/5ML IV SOSY
PREFILLED_SYRINGE | INTRAVENOUS | Status: DC | PRN
  Administered 2021-03-08: 50 mg via INTRAVENOUS

## 2021-03-08 MED ORDER — SODIUM CHLORIDE 0.9 % IV SOLN: INTRAVENOUS | Status: DC

## 2021-03-08 MED ORDER — PROPOFOL 500 MG/50ML IV EMUL
INTRAVENOUS | Status: DC | PRN
  Administered 2021-03-08: 150 ug/kg/min via INTRAVENOUS

## 2021-03-08 MED ORDER — LIDOCAINE HCL (PF) 2 % IJ SOLN
INTRAMUSCULAR | Status: AC
  Filled 2021-03-08: qty 5

## 2021-03-08 MED ORDER — PROPOFOL 500 MG/50ML IV EMUL
INTRAVENOUS | Status: AC
  Filled 2021-03-08: qty 50

## 2021-03-08 MED ORDER — PROPOFOL 10 MG/ML IV BOLUS
INTRAVENOUS | Status: DC | PRN
  Administered 2021-03-08: 100 mg via INTRAVENOUS

## 2021-03-08 NOTE — Op Note (Signed)
[facility] Patient Name: [name] Procedure Date: 03/08/2021 7:48 AM MRN: [id number] Account #: [id number] Date of Birth: Oct 29, 1965 Admit Type: Outpatient Age: 55 Room: [facility] ENDO ROOM 2 Gender: Male Note Status: Finalized Instrument Name: Upper Endoscope [id number] Procedure:             Upper GI endoscopy Indications:           Follow-up of reflux esophagitis Providers:             Jonathon Bellows MD, MD Referring MD:          No Local Md, MD (Referring MD) Medicines:             Monitored Anesthesia Care Complications:         No immediate complications. Procedure:             Pre-Anesthesia Assessment:                        - Prior to the procedure, a History and Physical was                         performed, and patient medications, allergies and                         sensitivities were reviewed. The patient's tolerance                         of previous anesthesia was reviewed.                        - The risks and benefits of the procedure and the                         sedation options and risks were discussed with the                         patient. All questions were answered and informed                         consent was obtained.                        - ASA Grade Assessment: II - A patient with mild                         systemic disease.                        After obtaining informed consent, the endoscope was                         passed under direct vision. Throughout the procedure,                         the patient's blood pressure, pulse, and oxygen                         saturations were monitored continuously. The Endoscope  was introduced through the mouth, and advanced to the                         body of the stomach. The upper GI endoscopy was                         accomplished with ease. The patient tolerated the                         procedure well. Findings:      Food was found  in the lower third of the esophagus.      A large amount of food (residue) was found in the entire examined       stomach. Did not proceed beyond stomach due to lrge qtu of food in stomch Impression:            - Food in the lower third of the esophagus.                        - A large amount of food (residue) in the stomach.                        - No specimens collected. Recommendation:        - Discharge patient to home (with escort).                        - Resume previous diet.                        - Continue present medications.                        - Repeat upper endoscopy in 2 weeks because the                         preparation was poor.                        - Recommend next procedure to be done with clears for                         24 hours, may have delayed gastric emptying but need                         to rule out gastric outlet obstruction Procedure Code(s):     --- Professional ---                        43235, 52, Esophagogastroduodenoscopy, flexible,                         transoral; diagnostic, including collection of                         specimen(s) by brushing or washing, when performed                         (separate procedure) Diagnosis Code(s):     --- Professional ---  N02.725D, Food in esophagus causing other injury,                         initial encounter                        K21.00, Gastro-esophageal reflux disease with                         esophagitis, without bleeding CPT copyright 2019 American Medical Association. All rights reserved. The codes documented in this report are preliminary and upon coder review may  be revised to meet current compliance requirements. Jonathon Bellows, MD Jonathon Bellows MD, MD 03/08/2021 7:56:19 AM This report has been signed electronically. Number of Addenda: 0 Note Initiated On: 03/08/2021 7:48 AM Estimated Blood Loss:  Estimated blood loss: none.      [facility]

## 2021-03-08 NOTE — Anesthesia Preprocedure Evaluation (Signed)
Anesthesia Evaluation  Patient identified by MRN, date of birth, ID band Patient awake    Reviewed: Allergy & Precautions, NPO status , Patient's Chart, lab work & pertinent test results  History of Anesthesia Complications Negative for: history of anesthetic complications  Airway Mallampati: III  TM Distance: >3 FB Neck ROM: Full    Dental  (+) Poor Dentition, Missing,    Pulmonary sleep apnea and Continuous Positive Airway Pressure Ventilation , neg COPD, former smoker,    breath sounds clear to auscultation- rhonchi (-) wheezing      Cardiovascular hypertension, +CHF  (-) CAD, (-) Past MI, (-) Cardiac Stents and (-) CABG  Rhythm:Regular Rate:Normal - Systolic murmurs and - Diastolic murmurs L/R heart cath 08/01/16: 1. Angiographically normal coronary arteries with no obstructive CAD 2. Vigorous LV systolic function with elevated LVEDP 3. 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 The patient has no significant CAD.    Neuro/Psych neg Seizures PSYCHIATRIC DISORDERS Depression TIA   GI/Hepatic Neg liver ROS, GERD  ,  Endo/Other  diabetes, Oral Hypoglycemic Agents  Renal/GU Renal disease: hx of nephrolithiasis.     Musculoskeletal  (+) Arthritis ,   Abdominal (+) + obese,   Peds  Hematology negative hematology ROS (+)   Anesthesia Other Findings Past Medical History: No date: CHF (congestive heart failure) (HCC) No date: CKD (chronic kidney disease) No date: Complication of anesthesia No date: Diabetes mellitus without complication (HCC) No date: History of Doppler ultrasound     Comment:  Carotid US 11/17: Stable 1-39% bilateral ICA stenosis;               Normal subclavian arteries, bilaterally. No date: History of  echocardiogram     Comment:  Echo 11/17: EF 60-65, normal wall motion, grade 1               diastolic dysfunction, aortic sclerosis without stenosis,              trivial MR, normal RVSF, moderate RAE 02/21/2014: Hyperlipidemia     Comment:  diet controlled, no medications No date: Left acetabular fracture (HCC)     Comment:  nonoperative around age 69 02/17/2021: Sleep apnea, unspecified 02/21/2014: Stroke ([place])     Comment:  TIA - no limitaions from stroke No date: Ulcer     Comment:  Hx - no longer a problem per patient   Reproductive/Obstetrics                             Anesthesia Physical Anesthesia Plan  ASA: 3  Anesthesia Plan: General   Post-op Pain Management:    Induction: Intravenous  PONV Risk Score and Plan: 1 and Propofol infusion  Airway Management Planned: Natural Airway  Additional Equipment:   Intra-op Plan:   Post-operative Plan:   Informed Consent: I have reviewed the patients History and Physical, chart, labs and discussed the procedure including the risks, benefits and alternatives for the proposed anesthesia with the patient or authorized representative who has indicated his/her understanding and acceptance.     Dental advisory given  Plan Discussed with: CRNA and Anesthesiologist  Anesthesia Plan Comments:         Anesthesia Quick Evaluation

## 2021-03-08 NOTE — Addendum Note (Signed)
Addendum  created 03/08/21 [id number] by Hedda Slade, CRNA   Intraprocedure Meds edited

## 2021-03-08 NOTE — H&P (Signed)
Scott Bellows, MD [address], [place], [place], [place], [address] [address], [place], [place], [place], [address] Phone: [phone]  Fax: [phone]  Primary Care Physician:  [facility]   Pre-Procedure History & Physical: HPI:  FRASER BUSCHE is a 55 y.o. male is here for an endoscopy    Past Medical History:  Diagnosis Date   CHF (congestive heart failure) ([place])    CKD (chronic kidney disease)    Complication of anesthesia    Diabetes mellitus without complication ([place])    History of Doppler ultrasound    Carotid US 11/17: Stable 1-39% bilateral ICA stenosis; Normal subclavian arteries, bilaterally.   History of echocardiogram    Echo 11/17: EF 60-65, normal wall motion, grade 1 diastolic dysfunction, aortic sclerosis without stenosis, trivial MR, normal RVSF, moderate RAE   Hyperlipidemia 02/21/2014   diet controlled, no medications   Left acetabular fracture ([place])    nonoperative around age 83   Sleep apnea, unspecified 02/17/2021   Stroke ([place]) 02/21/2014   TIA - no limitaions from stroke   Ulcer    Hx - no longer a problem per patient    Past Surgical History:  Procedure Laterality Date   colonscopy     COSMETIC SURGERY     Dermal tatoo removal face from accident   ESOPHAGOGASTRODUODENOSCOPY N/A 08/07/2016   Procedure: ESOPHAGOGASTRODUODENOSCOPY (EGD);  Surgeon: Manus Gunning, MD;  Location: [place];  Service: Gastroenterology;  Laterality: N/A;   KNEE ARTHROSCOPY W/ MENISCAL REPAIR     Both knees   RIGHT/LEFT HEART CATH AND CORONARY ANGIOGRAPHY N/A 08/01/2016   Procedure: Right/Left Heart Cath and Coronary Angiography;  Surgeon: Sherren Mocha, MD;  Location: [facility];  Service: Cardiovascular;  Laterality: N/A;   TONSILLECTOMY     UPPER GASTROINTESTINAL ENDOSCOPY     hx ulcer, no longer a problem per patient   WISDOM TOOTH EXTRACTION      Prior to Admission medications   Medication Sig Start Date End Date  Taking? Authorizing Provider  allopurinol (ZYLOPRIM) 100 MG tablet Take 2 tablets by mouth daily. 04/30/18 11/12/21 Yes [provider]  cetirizine (ZYRTEC) 10 MG tablet Take 1 tablet by mouth 2 (two) times daily. 10/13/19  Yes [provider]  empagliflozin (JARDIANCE) 25 MG TABS tablet TAKE ONE-HALF TABLET BY MOUTH ONCE DAILY FOR DIABETES, HEART AND KIDNEYS 03/31/20 04/01/21 Yes [provider]  ezetimibe (ZETIA) 10 MG tablet Take 1 tablet by mouth daily. 08/11/20 08/12/21 Yes [provider]  metFORMIN (GLUCOPHAGE-XR) 500 MG 24 hr tablet TAKE FOUR TABLETS BY MOUTH DAILY FOR DIABETES (ANNUAL KIDNEY FUNCTION TESTING IS NEEDED) 08/26/20 08/27/21 Yes [provider]  metoCLOPramide (REGLAN) 10 MG tablet TAKE ONE TABLET BY MOUTH TWICE A DAY TAKE 1 EACH MORNING AND 1 EACH EVENING TO HELP STOMACH EMPTY AND DECREASE  NAUSEA. 12/20/20 12/21/21 Yes [provider]  metolazone (ZAROXOLYN) 2.5 MG tablet TAKE ONE TABLET BY MOUTH DAILY AS NEEDED FOR WEIGHT UP 5 LBS OR MORE 02/12/20 08/12/21 Yes [provider]  metoprolol succinate (TOPROL-XL) 50 MG 24 hr tablet TAKE 1 TABLET DAILY WITH OR IMMEDIATELY FOLLOWING A MEAL 01/12/17  Yes Susy Frizzle, MD  oxymetazoline (AFRIN) 0.05 % nasal spray Place 1 spray into both nostrils 2 (two) times daily.   Yes [provider]  pantoprazole (PROTONIX) 40 MG tablet Take 1 tablet (40 mg total) by mouth 2 (two) times daily. 01/14/18  Yes Milus Banister, MD  pantoprazole (PROTONIX) 40 MG tablet in the morning and at bedtime. 06/20/17  Yes [provider]  Semaglutide,0.25 or 0.5MG /DOS, 2 MG/1.5ML SOPN once a week. 05/06/20  Yes [provider]  tamsulosin (FLOMAX) 0.4 MG CAPS capsule TAKE ONE CAPSULE BY MOUTH DAILY 30 MINUTES AFTER LAST MEAL OF THE DAY 02/02/21  Yes [provider]  venlafaxine XR (EFFEXOR-XR) 37.5 MG 24 hr capsule TAKE ONE CAPSULE BY MOUTH DAILY FOR 14 DAYS 02/11/21  Yes  [provider]  ascorbic acid (VITAMIN C) 500 MG tablet TAKE ONE TABLET BY MOUTH EVERY OTHER DAY -TAKE WITH IRON 11/12/20   [provider]  aspirin EC 81 MG tablet Take 1 tablet (81 mg total) by mouth daily. Patient not taking: Reported on 03/08/2021 04/11/16   Richardson Dopp T, PA-C  benazepril (LOTENSIN) 40 MG tablet Take 40 mg by mouth. Patient not taking: Reported on 03/08/2021 02/04/16   [provider]  buPROPion (WELLBUTRIN XL) 150 MG 24 hr tablet Take 1 tablet by mouth daily. Patient not taking: Reported on 03/08/2021 01/20/21 12/23/21  [provider]  Cholecalciferol 50 MCG (2000 UT) CAPS Take by mouth.    [provider]  clopidogrel (PLAVIX) 75 MG tablet Take 1 tablet by mouth daily. 11/30/20 12/01/21  [provider]  cyanocobalamin (,VITAMIN B-12,) 1000 MCG/ML injection INJECT 1 ML (1000MCG) INTRAMUSCULARLY MONTHLY 08/26/20 08/27/21  [provider]  diazepam (VALIUM) 5 MG tablet TAKE TWO TABLETS BY MOUTH AS DIRECTED BY YOUR MEDICAL PROVIDER -- TAKE 2 TABLETS BY MOUTH 20 MINUTES PRIOR TO PROCEDURE- MUST HAVE DRIVER Patient not taking: Reported on 03/08/2021 04/07/20   [provider]  DULoxetine (CYMBALTA) 30 MG capsule Take 1 capsule by mouth in the morning and at bedtime. Patient not taking: Reported on 03/08/2021 08/04/20 08/05/21  [provider]  enoxaparin (LOVENOX) 40 MG/0.4ML injection Inject into the skin. Patient not taking: Reported on 03/08/2021    [provider]  ferrous sulfate 325 (65 FE) MG tablet TAKE ONE TABLET BY MOUTH EVERY OTHER DAY - TAKE WITH VITAMIN C 11/12/20   [provider]  FLUoxetine (PROZAC) 20 MG capsule TAKE TWO CAPSULES BY MOUTH DAILY FOR MENTAL HEALTH Patient not taking: Reported on 03/08/2021 10/13/20 10/14/21  [provider]  glucose 4 GM chewable tablet Chew by mouth. Patient not taking: Reported on 03/08/2021 08/26/20   [provider]  HYDROmorphone  (DILAUDID) 2 MG tablet Take 1 tablet (2 mg total) by mouth every 4 (four) hours as needed for severe pain. Patient not taking: Reported on 03/08/2021 07/12/17   Susy Frizzle, MD  KRILL OIL PO Take by mouth. Patient not taking: Reported on 03/08/2021    [provider]  loratadine-pseudoephedrine (CLARITIN-D 12-HOUR) 5-120 MG tablet Take 1 tablet by mouth 2 (two) times daily. Patient not taking: Reported on 03/08/2021    [provider]  naloxone [facility]) 2 MG/2ML injection CALL 911. For suspected opioid overdose, spray 1 mL in each nostril.  Repeat after 3 minutes if no or minimal response. 02/09/21   [provider]  naproxen sodium (ALEVE) 220 MG tablet Take 220 mg by mouth. Patient not taking: Reported on 03/08/2021    [provider]  nitroGLYCERIN (NITROSTAT) 0.4 MG SL tablet Place under the tongue as needed.    [provider]  Phenylephrine-APAP-Guaifenesin (TYLENOL SINUS SEVERE PO) Take by mouth. Patient not taking: Reported on 03/08/2021    [provider]  potassium chloride SA (KLOR-CON M20) 20 MEQ  tablet Take 1.5 tablets (30 mEq total) by mouth daily. 06/01/16 02/17/21  Richardson Dopp T, PA-C  promethazine (PHENERGAN) 12.5 MG tablet Take 12.5 mg by mouth every 6 (six) hours as needed. for nausea Patient not taking: Reported on 03/08/2021 09/07/17   [provider]  promethazine (PHENERGAN) 25 MG tablet Take 1 tablet (25 mg total) by mouth every 8 (eight) hours as needed for nausea or vomiting. Patient not taking: Reported on 03/08/2021 06/19/17   Susy Frizzle, MD  sertraline (ZOLOFT) 50 MG tablet Take 1 tablet by mouth daily. Patient not taking: Reported on 03/08/2021 02/10/21   [provider]  spironolactone (ALDACTONE) 25 MG tablet Take 0.5 tablets by mouth daily. Patient not taking: Reported on 03/08/2021 11/03/20 11/04/21  [provider]    Allergies as of 02/17/2021 - Review Complete 02/17/2021  Allergen  Reaction Noted   Bupropion  02/04/2021   Demerol [meperidine] Anaphylaxis 07/09/2012   Fluoxetine Other (See Comments) 02/04/2021   Amitriptyline Other (See Comments) 05/19/2019   Oxycodone Rash 07/09/2012   Sucralfate Nausea Only, Other (See Comments), and Nausea And Vomiting 03/31/2020   Demerol [meperidine hcl]  02/20/2002   Oxycodone-acetaminophen  02/20/2002    Family History  Problem Relation Age of Onset   Hyperlipidemia Mother    Hypertension Mother    Heart disease Father    Hyperlipidemia Father    Hypertension Father    Heart attack Father    Heart disease Sister    Hyperlipidemia Sister    Hypertension Sister    Hyperlipidemia Brother    Hypertension Brother    Diabetes Brother    Colon polyps Brother    Cancer Maternal Grandmother    Hearing loss Maternal Grandmother    Hypertension Maternal Grandmother    Esophageal cancer Maternal Grandmother    Cancer Maternal Grandfather    Colon cancer Maternal Grandfather    Cancer Paternal Grandmother    Heart disease Paternal Grandmother    Hypertension Paternal Grandmother    Cancer Paternal Grandfather    Diabetes Brother    Heart disease Brother    Hyperlipidemia Brother    Hypertension Brother    Colon cancer Maternal Uncle    Rectal cancer Neg Hx    Stomach cancer Neg Hx     Social History   Socioeconomic History   Marital status: Single    Spouse name: Not on file   Number of children: Not on file   Years of education: Not on file   Highest education level: Not on file  Occupational History   Occupation: Radiation protection practitioner     Comment: at [employer] Use   Smoking status: Former    Packs/day: 2.00    Types: Cigarettes    Start date: 07/09/1981    Quit date: 07/10/1999    Years since quitting: 21.6   Smokeless tobacco: Never  Vaping Use   Vaping Use: Never used  Substance and Sexual Activity   Alcohol use: No    Alcohol/week: 0.0 standard drinks   Drug use: No    Comment:  Mistake made in chart pt has never used meth.   Sexual activity: Never    Birth control/protection: Abstinence    Comment: gay but not sexually active since ~ 2008  Other Topics Concern   Not on file  Social History Narrative   Previous Armed forces logistics/support/administrative officer.    Social Determinants of Health   Financial Resource Strain: Not on file  Food Insecurity: Not on  file  Transportation Needs: Not on file  Physical Activity: Not on file  Stress: Not on file  Social Connections: Not on file  Intimate Partner Violence: Not on file    Review of Systems: See HPI, otherwise negative ROS  Physical Exam: BP (!) 129/92   Pulse (!) 106   Temp (!) 97 F (36.1 C) (Temporal)   Resp 18   Ht 6\' 2"  (1.88 m)   Wt (!) 137.4 kg   SpO2 95%   BMI 38.90 kg/m  General:   Alert,  pleasant and cooperative in NAD Head:  Normocephalic and atraumatic. Neck:  Supple; no masses or thyromegaly. Lungs:  Clear throughout to auscultation, normal respiratory effort.    Heart:  +S1, +S2, Regular rate and rhythm, No edema. Abdomen:  Soft, nontender and nondistended. Normal bowel sounds, without guarding, and without rebound.   Neurologic:  Alert and  oriented x4;  grossly normal neurologically.  Impression/Plan: KARRIEM MUENCH is here for an endoscopy  to be performed for  evaluation of esophagitis    Risks, benefits, limitations, and alternatives regarding endoscopy have been reviewed with the patient.  Questions have been answered.  All parties agreeable.   Scott Bellows, MD  03/08/2021, 7:47 AM

## 2021-03-08 NOTE — Transfer of Care (Signed)
Immediate Anesthesia Transfer of Care Note  Patient: [name]  Procedure(s) Performed: ESOPHAGOGASTRODUODENOSCOPY (EGD) WITH PROPOFOL  Patient Location: PACU  Anesthesia Type:General  Level of Consciousness: drowsy  Airway & Oxygen Therapy: Patient Spontanous Breathing and Patient connected to nasal cannula oxygen  Post-op Assessment: Report given to RN and Post -op Vital signs reviewed and stable  Post vital signs: Reviewed and stable  Last Vitals:  Vitals Value Taken Time  BP 103/63 03/08/21 0758  Temp    Pulse 90 03/08/21 0758  Resp 20 03/08/21 0758  SpO2 93 % 03/08/21 0758  Vitals shown include unvalidated device data.  Last Pain:  Vitals:   03/08/21 0701  TempSrc: Temporal  PainSc: 5          Complications: No notable events documented.

## 2021-03-08 NOTE — Anesthesia Postprocedure Evaluation (Signed)
Anesthesia Post Note  Patient: [name]  Procedure(s) Performed: ESOPHAGOGASTRODUODENOSCOPY (EGD) WITH PROPOFOL  Patient location during evaluation: Endoscopy Anesthesia Type: General Level of consciousness: awake and alert and oriented Pain management: pain level controlled Vital Signs Assessment: post-procedure vital signs reviewed and stable Respiratory status: spontaneous breathing, nonlabored ventilation and respiratory function stable Cardiovascular status: blood pressure returned to baseline and stable Postop Assessment: no signs of nausea or vomiting Anesthetic complications: no   No notable events documented.   Last Vitals:  Vitals:   03/08/21 0805 03/08/21 0808  BP:  113/78  Pulse:    Resp:    Temp: (!) 35.6 C   SpO2:      Last Pain:  Vitals:   03/08/21 0823  TempSrc:   PainSc: 5                  Kyen Taite

## 2021-03-09 ENCOUNTER — Encounter: Payer: Self-pay | Admitting: Gastroenterology

## 2021-03-09 ENCOUNTER — Telehealth: Payer: Self-pay | Admitting: Gastroenterology

## 2021-03-09 NOTE — Telephone Encounter (Signed)
Daryll Drown, you want this patient to repeat an EGD in two weeks? Please let me know what the diagnosis you will be doing it under. Thank you.

## 2021-03-09 NOTE — Telephone Encounter (Signed)
Pt. Says he had a procedure done on yesterday 03/08/21. He says Dr. Vicente Males told him call back and schedule for another one in tow weeks. Pt calling to schedule endoscopy

## 2021-03-10 ENCOUNTER — Other Ambulatory Visit: Payer: Self-pay

## 2021-03-10 DIAGNOSIS — Z8719 Personal history of other diseases of the digestive system: Secondary | ICD-10-CM

## 2021-03-10 DIAGNOSIS — K219 Gastro-esophageal reflux disease without esophagitis: Secondary | ICD-10-CM

## 2021-03-10 NOTE — Telephone Encounter (Signed)
Yes , Recommend next procedure to be done with clears for 24 hours, may have delayed gastric emptying - gastroparesis

## 2021-03-10 NOTE — Telephone Encounter (Signed)
Patient called back and I was able to reschedule him on 03/22/2021 and I also gave him Dr. Georgeann Oppenheim recommendations and patient agreed. Patient had no further questions.

## 2021-03-14 DIAGNOSIS — F32A Depression, unspecified: Secondary | ICD-10-CM | POA: Insufficient documentation

## 2021-03-21 ENCOUNTER — Encounter: Payer: Self-pay | Admitting: Gastroenterology

## 2021-03-22 ENCOUNTER — Encounter
Admission: RE | Disposition: A | Discharge status: Home / Self Care | Payer: Self-pay | Source: Ambulatory Visit | Attending: Gastroenterology

## 2021-03-22 ENCOUNTER — Encounter: Payer: Self-pay | Admitting: Gastroenterology

## 2021-03-22 ENCOUNTER — Other Ambulatory Visit: Payer: Self-pay

## 2021-03-22 ENCOUNTER — Ambulatory Visit: Payer: No Typology Code available for payment source | Admitting: Anesthesiology

## 2021-03-22 ENCOUNTER — Ambulatory Visit
Admission: RE | Admit: 2021-03-22 | Discharge: 2021-03-22 | Disposition: A | Discharge status: Home / Self Care | Payer: No Typology Code available for payment source | Source: Ambulatory Visit | Attending: Gastroenterology | Admitting: Gastroenterology

## 2021-03-22 DIAGNOSIS — Z8719 Personal history of other diseases of the digestive system: Secondary | ICD-10-CM

## 2021-03-22 DIAGNOSIS — Z87891 Personal history of nicotine dependence: Secondary | ICD-10-CM | POA: Diagnosis not present

## 2021-03-22 DIAGNOSIS — Z7985 Long-term (current) use of injectable non-insulin antidiabetic drugs: Secondary | ICD-10-CM | POA: Diagnosis not present

## 2021-03-22 DIAGNOSIS — Z7984 Long term (current) use of oral hypoglycemic drugs: Secondary | ICD-10-CM | POA: Diagnosis not present

## 2021-03-22 DIAGNOSIS — K219 Gastro-esophageal reflux disease without esophagitis: Secondary | ICD-10-CM | POA: Diagnosis present

## 2021-03-22 DIAGNOSIS — Z8673 Personal history of transient ischemic attack (TIA), and cerebral infarction without residual deficits: Secondary | ICD-10-CM | POA: Diagnosis not present

## 2021-03-22 DIAGNOSIS — Z79899 Other long term (current) drug therapy: Secondary | ICD-10-CM | POA: Insufficient documentation

## 2021-03-22 DIAGNOSIS — Z888 Allergy status to other drugs, medicaments and biological substances status: Secondary | ICD-10-CM | POA: Diagnosis not present

## 2021-03-22 DIAGNOSIS — Z79891 Long term (current) use of opiate analgesic: Secondary | ICD-10-CM | POA: Diagnosis not present

## 2021-03-22 DIAGNOSIS — Z7902 Long term (current) use of antithrombotics/antiplatelets: Secondary | ICD-10-CM | POA: Diagnosis not present

## 2021-03-22 DIAGNOSIS — Z9861 Coronary angioplasty status: Secondary | ICD-10-CM | POA: Insufficient documentation

## 2021-03-22 DIAGNOSIS — Z885 Allergy status to narcotic agent status: Secondary | ICD-10-CM | POA: Insufficient documentation

## 2021-03-22 HISTORY — DX: Gastroparesis: K31.84

## 2021-03-22 HISTORY — PX: ESOPHAGOGASTRODUODENOSCOPY (EGD) WITH PROPOFOL: SHX5813

## 2021-03-22 HISTORY — DX: Abnormal radiologic findings on diagnostic imaging of unspecified kidney: R93.429

## 2021-03-22 HISTORY — DX: Type 2 diabetes mellitus with unspecified diabetic retinopathy without macular edema: E11.319

## 2021-03-22 LAB — GLUCOSE, CAPILLARY: Glucose-Capillary: 104 mg/dL — ABNORMAL HIGH (ref 70–99)

## 2021-03-22 SURGERY — ESOPHAGOGASTRODUODENOSCOPY (EGD) WITH PROPOFOL
Anesthesia: General

## 2021-03-22 MED ORDER — DEXMEDETOMIDINE HCL IN NACL 200 MCG/50ML IV SOLN
INTRAVENOUS | Status: DC | PRN
  Administered 2021-03-22: 8 ug via INTRAVENOUS

## 2021-03-22 MED ORDER — PROPOFOL 500 MG/50ML IV EMUL
INTRAVENOUS | Status: AC
  Filled 2021-03-22: qty 50

## 2021-03-22 MED ORDER — PROPOFOL 10 MG/ML IV BOLUS
INTRAVENOUS | Status: DC | PRN
  Administered 2021-03-22: 30 mg via INTRAVENOUS
  Administered 2021-03-22: 100 mg via INTRAVENOUS

## 2021-03-22 MED ORDER — LIDOCAINE HCL (PF) 2 % IJ SOLN
INTRAMUSCULAR | Status: AC
  Filled 2021-03-22: qty 5

## 2021-03-22 MED ORDER — SODIUM CHLORIDE 0.9 % IV SOLN: INTRAVENOUS | Status: DC

## 2021-03-22 MED ORDER — PROPOFOL 10 MG/ML IV BOLUS
INTRAVENOUS | Status: AC
  Filled 2021-03-22: qty 20

## 2021-03-22 MED ORDER — LIDOCAINE HCL (CARDIAC) PF 100 MG/5ML IV SOSY
PREFILLED_SYRINGE | INTRAVENOUS | Status: DC | PRN
  Administered 2021-03-22: 50 mg via INTRAVENOUS

## 2021-03-22 NOTE — H&P (Signed)
Jonathon Bellows, MD [address], [place], [place], [place], [address] [address], [place], [place], [place], [address] Phone: [phone]  Fax: [phone]  Primary Care Physician:  [facility]   Pre-Procedure History & Physical: HPI:  RAFIK KOPPEL is a 55 y.o. male is here for an endoscopy    Past Medical History:  Diagnosis Date   Abnormal CT scan, kidney    mass on right kidney, will have biopsy on NOv 3rd   CHF (congestive heart failure) (HCC)    CKD (chronic kidney disease)    Complication of anesthesia    Diabetes mellitus without complication ([place])    Diabetic retinopathy ([place])    Gastroparesis    History of Doppler ultrasound    Carotid US 11/17: Stable 1-39% bilateral ICA stenosis; Normal subclavian arteries, bilaterally.   History of echocardiogram    Echo 11/17: EF 60-65, normal wall motion, grade 1 diastolic dysfunction, aortic sclerosis without stenosis, trivial MR, normal RVSF, moderate RAE   Hyperlipidemia 02/21/2014   diet controlled, no medications   Left acetabular fracture ([place])    nonoperative around age 77   Sleep apnea, unspecified 02/17/2021   Stroke ([place]) 02/21/2014   TIA - no limitaions from stroke   Ulcer    Hx - no longer a problem per patient    Past Surgical History:  Procedure Laterality Date   CARDIAC CATHETERIZATION     colonscopy     CORONARY ANGIOPLASTY     COSMETIC SURGERY     Dermal tatoo removal face from accident   ESOPHAGOGASTRODUODENOSCOPY N/A 08/07/2016   Procedure: ESOPHAGOGASTRODUODENOSCOPY (EGD);  Surgeon: Manus Gunning, MD;  Location: [facility];  Service: Gastroenterology;  Laterality: N/A;   ESOPHAGOGASTRODUODENOSCOPY (EGD) WITH PROPOFOL N/A 03/08/2021   Procedure: ESOPHAGOGASTRODUODENOSCOPY (EGD) WITH PROPOFOL;  Surgeon: Jonathon Bellows, MD;  Location: [facility] ENDOSCOPY;  Service: Gastroenterology;  Laterality: N/A;   KNEE ARTHROSCOPY W/ MENISCAL REPAIR     Both knees   RIGHT/LEFT HEART CATH  AND CORONARY ANGIOGRAPHY N/A 08/01/2016   Procedure: Right/Left Heart Cath and Coronary Angiography;  Surgeon: Sherren Mocha, MD;  Location: [facility];  Service: Cardiovascular;  Laterality: N/A;   TONSILLECTOMY     TUBAL LIGATION     UPPER GASTROINTESTINAL ENDOSCOPY     hx ulcer, no longer a problem per patient   WISDOM TOOTH EXTRACTION      Prior to Admission medications   Medication Sig Start Date End Date Taking? Authorizing Provider  allopurinol (ZYLOPRIM) 100 MG tablet Take 2 tablets by mouth daily. 04/30/18 11/12/21 Yes [provider]  buprenorphine (BUTRANS) 10 MCG/HR [place] onto the skin once a week.   Yes [provider]  buprenorphine (BUTRANS) 20 MCG/HR [place] onto the skin once a week.   Yes [provider]  cetirizine (ZYRTEC) 10 MG tablet Take 1 tablet by mouth 2 (two) times daily. 10/13/19  Yes [provider]  empagliflozin (JARDIANCE) 25 MG TABS tablet TAKE ONE-HALF TABLET BY MOUTH ONCE DAILY FOR DIABETES, HEART AND KIDNEYS 03/31/20 04/01/21 Yes [provider]  ezetimibe (ZETIA) 10 MG tablet Take 1 tablet by mouth daily. 08/11/20 08/12/21 Yes [provider]  furosemide (LASIX) 20 MG tablet Take 20 mg by mouth.   Yes [provider]  furosemide (LASIX) 20 MG tablet Take 20 mg by mouth.   Yes [provider]  furosemide (LASIX) 40 MG tablet Take 40 mg by mouth.   Yes [provider]  metoCLOPramide (REGLAN) 10 MG tablet TAKE ONE TABLET BY MOUTH TWICE A DAY TAKE 1 EACH MORNING AND 1 EACH EVENING TO HELP STOMACH EMPTY AND DECREASE  NAUSEA. 12/20/20 12/21/21 Yes [provider]  metolazone (ZAROXOLYN) 2.5 MG tablet TAKE ONE TABLET BY MOUTH DAILY AS NEEDED FOR WEIGHT UP 5 LBS OR MORE 02/12/20 08/12/21 Yes [provider]  metoprolol succinate (TOPROL-XL) 50 MG 24 hr tablet TAKE 1 TABLET DAILY WITH OR IMMEDIATELY FOLLOWING A MEAL 01/12/17  Yes Susy Frizzle, MD   oxymetazoline (AFRIN) 0.05 % nasal spray Place 1 spray into both nostrils 2 (two) times daily.   Yes [provider]  pantoprazole (PROTONIX) 40 MG tablet Take 1 tablet (40 mg total) by mouth 2 (two) times daily. 01/14/18  Yes Milus Banister, MD  potassium chloride SA (KLOR-CON M20) 20 MEQ tablet Take 1.5 tablets (30 mEq total) by mouth daily. 06/01/16 03/22/21 Yes Weaver, Scott T, PA-C  promethazine (PHENERGAN) 12.5 MG tablet Take 12.5 mg by mouth every 6 (six) hours as needed. for nausea 09/07/17  Yes [provider]  tamsulosin (FLOMAX) 0.4 MG CAPS capsule TAKE ONE CAPSULE BY MOUTH DAILY 30 MINUTES AFTER LAST MEAL OF THE DAY 02/02/21  Yes [provider]  venlafaxine XR (EFFEXOR-XR) 37.5 MG 24 hr capsule TAKE ONE CAPSULE BY MOUTH DAILY FOR 14 DAYS 02/11/21  Yes [provider]  ascorbic acid (VITAMIN C) 500 MG tablet TAKE ONE TABLET BY MOUTH EVERY OTHER DAY -TAKE WITH IRON 11/12/20   [provider]  aspirin EC 81 MG tablet Take 1 tablet (81 mg total) by mouth daily. Patient not taking: Reported on 03/08/2021 04/11/16   Richardson Dopp T, PA-C  benazepril (LOTENSIN) 40 MG tablet Take 40 mg by mouth. Patient not taking: Reported on 03/08/2021 02/04/16   [provider]  buPROPion (WELLBUTRIN XL) 150 MG 24 hr tablet Take 1 tablet by mouth daily. Patient not taking: Reported on 03/08/2021 01/20/21 12/23/21  [provider]  Cholecalciferol 50 MCG (2000 UT) CAPS Take by mouth.    [provider]  clopidogrel (PLAVIX) 75 MG tablet Take 1 tablet by mouth daily. 11/30/20 12/01/21  [provider]  cyanocobalamin (,VITAMIN B-12,) 1000 MCG/ML injection INJECT 1 ML (1000MCG) INTRAMUSCULARLY MONTHLY 08/26/20 08/27/21  [provider]  diazepam (VALIUM) 5 MG tablet TAKE TWO TABLETS BY MOUTH AS DIRECTED BY YOUR MEDICAL PROVIDER -- TAKE 2 TABLETS BY MOUTH 20 MINUTES PRIOR TO PROCEDURE- MUST HAVE DRIVER Patient not taking: Reported on  03/08/2021 04/07/20   [provider]  DULoxetine (CYMBALTA) 30 MG capsule Take 1 capsule by mouth in the morning and at bedtime. Patient not taking: Reported on 03/08/2021 08/04/20 08/05/21  [provider]  enoxaparin (LOVENOX) 40 MG/0.4ML injection Inject into the skin. Patient not taking: Reported on 03/08/2021    [provider]  ferrous sulfate 325 (65 FE) MG tablet TAKE ONE TABLET BY MOUTH EVERY OTHER DAY - TAKE WITH VITAMIN C 11/12/20   [provider]  FLUoxetine (PROZAC) 20 MG capsule TAKE TWO CAPSULES BY MOUTH DAILY FOR MENTAL HEALTH Patient not taking: Reported on 03/08/2021 10/13/20 10/14/21  [provider]  glucose 4 GM chewable tablet Chew by mouth. Patient not taking: Reported on 03/08/2021 08/26/20   [provider]  HYDROmorphone (DILAUDID) 2 MG tablet Take 1 tablet (2 mg total) by mouth every 4 (four) hours as needed for severe pain. Patient not taking: Reported on 03/08/2021 07/12/17   Jenna Luo  T, MD  KRILL OIL PO Take by mouth. Patient not taking: Reported on 03/08/2021    [provider]  loratadine-pseudoephedrine (CLARITIN-D 12-HOUR) 5-120 MG tablet Take 1 tablet by mouth 2 (two) times daily. Patient not taking: Reported on 03/08/2021    [provider]  metFORMIN (GLUCOPHAGE-XR) 500 MG 24 hr tablet TAKE FOUR TABLETS BY MOUTH DAILY FOR DIABETES (ANNUAL KIDNEY FUNCTION TESTING IS NEEDED) 08/26/20 08/27/21  [provider]  naloxone Karma Greaser) 2 MG/2ML injection CALL 911. For suspected opioid overdose, spray 1 mL in each nostril.  Repeat after 3 minutes if no or minimal response. 02/09/21   [provider]  naproxen sodium (ALEVE) 220 MG tablet Take 220 mg by mouth. Patient not taking: Reported on 03/08/2021    [provider]  nitroGLYCERIN (NITROSTAT) 0.4 MG SL tablet Place under the tongue as needed.    [provider]  pantoprazole (PROTONIX) 40 MG tablet in the morning and at  bedtime. 06/20/17   [provider]  Phenylephrine-APAP-Guaifenesin (TYLENOL SINUS SEVERE PO) Take by mouth. Patient not taking: Reported on 03/08/2021    [provider]  promethazine (PHENERGAN) 25 MG tablet Take 1 tablet (25 mg total) by mouth every 8 (eight) hours as needed for nausea or vomiting. Patient not taking: Reported on 03/08/2021 06/19/17   Susy Frizzle, MD  Semaglutide,0.25 or 0.5MG /DOS, 2 MG/1.5ML SOPN once a week. 05/06/20   [provider]  sertraline (ZOLOFT) 50 MG tablet Take 1 tablet by mouth daily. Patient not taking: Reported on 03/08/2021 02/10/21   [provider]  spironolactone (ALDACTONE) 25 MG tablet Take 0.5 tablets by mouth daily. Patient not taking: Reported on 03/08/2021 11/03/20 11/04/21  [provider]    Allergies as of 03/11/2021 - Review Complete 03/08/2021  Allergen Reaction Noted   Bupropion  02/04/2021   Demerol [meperidine] Anaphylaxis 07/09/2012   Fluoxetine Other (See Comments) 02/04/2021   Amitriptyline Other (See Comments) 05/19/2019   Oxycodone Rash 07/09/2012   Sucralfate Nausea Only, Other (See Comments), and Nausea And Vomiting 03/31/2020   Demerol [meperidine hcl]  02/20/2002   Oxycodone-acetaminophen  02/20/2002    Family History  Problem Relation Age of Onset   Hyperlipidemia Mother    Hypertension Mother    Heart disease Father    Hyperlipidemia Father    Hypertension Father    Heart attack Father    Heart disease Sister    Hyperlipidemia Sister    Hypertension Sister    Hyperlipidemia Brother    Hypertension Brother    Diabetes Brother    Colon polyps Brother    Cancer Maternal Grandmother    Hearing loss Maternal Grandmother    Hypertension Maternal Grandmother    Esophageal cancer Maternal Grandmother    Cancer Maternal Grandfather    Colon cancer Maternal Grandfather    Cancer Paternal Grandmother    Heart disease Paternal Grandmother    Hypertension Paternal Grandmother     Cancer Paternal Grandfather    Diabetes Brother    Heart disease Brother    Hyperlipidemia Brother    Hypertension Brother    Colon cancer Maternal Uncle    Rectal cancer Neg Hx    Stomach cancer Neg Hx     Social History   Socioeconomic History   Marital status: Single    Spouse name: Not on file   Number of children: Not on file   Years of education: Not on file   Highest education level: Not on file  Occupational  History   Occupation: Radiation protection practitioner     Comment: at [employer] Use   Smoking status: Former    Packs/day: 2.00    Types: Cigarettes    Start date: 07/09/1981    Quit date: 07/10/1999    Years since quitting: 21.7   Smokeless tobacco: Never  Vaping Use   Vaping Use: Never used  Substance and Sexual Activity   Alcohol use: No    Alcohol/week: 0.0 standard drinks   Drug use: No    Comment: Mistake made in chart pt has never used meth.   Sexual activity: Never    Birth control/protection: Abstinence    Comment: gay but not sexually active since ~ 2008  Other Topics Concern   Not on file  Social History Narrative   Previous Armed forces logistics/support/administrative officer.    Social Determinants of Health   Financial Resource Strain: Not on file  Food Insecurity: Not on file  Transportation Needs: Not on file  Physical Activity: Not on file  Stress: Not on file  Social Connections: Not on file  Intimate Partner Violence: Not on file    Review of Systems: See HPI, otherwise negative ROS  Physical Exam: BP 109/72   Pulse 70   Temp (!) 97 F (36.1 C) (Temporal)   Resp 20   Ht 6' 2.5" (1.892 m)   Wt (!) 141.5 kg   SpO2 95%   BMI 39.52 kg/m  General:   Alert,  pleasant and cooperative in NAD Head:  Normocephalic and atraumatic. Neck:  Supple; no masses or thyromegaly. Lungs:  Clear throughout to auscultation, normal respiratory effort.    Heart:  +S1, +S2, Regular rate and rhythm, No edema. Abdomen:  Soft, nontender and nondistended. Normal bowel sounds,  without guarding, and without rebound.   Neurologic:  Alert and  oriented x4;  grossly normal neurologically.  Impression/Plan: [name] is here for an endoscopy  to be performed for  evaluation of GERD    Risks, benefits, limitations, and alternatives regarding endoscopy have been reviewed with the patient.  Questions have been answered.  All parties agreeable.   Jonathon Bellows, MD  03/22/2021, 9:09 AM

## 2021-03-22 NOTE — Anesthesia Postprocedure Evaluation (Signed)
Anesthesia Post Note  Patient: [name]  Procedure(s) Performed: ESOPHAGOGASTRODUODENOSCOPY (EGD) WITH PROPOFOL  Patient location during evaluation: PACU Anesthesia Type: General Level of consciousness: awake and alert, oriented and patient cooperative Pain management: pain level controlled Vital Signs Assessment: post-procedure vital signs reviewed and stable Respiratory status: spontaneous breathing, nonlabored ventilation and respiratory function stable Cardiovascular status: blood pressure returned to baseline and stable Postop Assessment: adequate PO intake Anesthetic complications: no   No notable events documented.   Last Vitals:  Vitals:   03/22/21 1002 03/22/21 1015  BP:  97/67  Pulse:    Resp:    Temp: (!) 36.1 C   SpO2:  96%    Last Pain:  Vitals:   03/22/21 1015  TempSrc:   PainSc: 0-No pain                 Darrin Nipper

## 2021-03-22 NOTE — Telephone Encounter (Signed)
I just received a Plavix hold from the [place] and they stated that the patient could hold his Plavix 7 days prior to his procedure and restart it 1 day after procedure. However, after opening his chart, I noticed that the patient had been in the hospital and Dr. Vicente Males had scoped him today.

## 2021-03-22 NOTE — Transfer of Care (Signed)
Immediate Anesthesia Transfer of Care Note  Patient: [name]  Procedure(s) Performed: ESOPHAGOGASTRODUODENOSCOPY (EGD) WITH PROPOFOL  Patient Location: PACU and Endoscopy Unit  Anesthesia Type:General  Level of Consciousness: drowsy and patient cooperative  Airway & Oxygen Therapy: Patient Spontanous Breathing and Patient connected to face mask oxygen  Post-op Assessment: Report given to RN and Post -op Vital signs reviewed and stable  Post vital signs: Reviewed and stable  Last Vitals:  Vitals Value Taken Time  BP 100/68 03/22/21 0940  Temp    Pulse 69 03/22/21 0948  Resp 11 03/22/21 0948  SpO2 98 % 03/22/21 0948  Vitals shown include unvalidated device data.  Last Pain:  Vitals:   03/22/21 0905  TempSrc: Temporal  PainSc: 5          Complications: No notable events documented.

## 2021-03-22 NOTE — Anesthesia Preprocedure Evaluation (Addendum)
Anesthesia Evaluation  Patient identified by MRN, date of birth, ID band Patient awake    Reviewed: Allergy & Precautions, NPO status , Patient's Chart, lab work & pertinent test results  History of Anesthesia Complications Negative for: history of anesthetic complications  Airway Mallampati: II   Neck ROM: Full    Dental  (+)    Pulmonary sleep apnea, Continuous Positive Airway Pressure Ventilation and Oxygen sleep apnea , former smoker (quit 2001),    Pulmonary exam normal breath sounds clear to auscultation       Cardiovascular hypertension, +CHF  Normal cardiovascular exam Rhythm:Regular Rate:Normal  ECG 02/06/21: normal   Neuro/Psych PSYCHIATRIC DISORDERS Depression Chronic pain TIA (on Plavix)   GI/Hepatic GERD  ,  Endo/Other  diabetesObesity   Renal/GU Renal disease (CKD)     Musculoskeletal  (+) Arthritis ,   Abdominal   Peds  Hematology negative hematology ROS (+)   Anesthesia Other Findings   Reproductive/Obstetrics                            Anesthesia Physical Anesthesia Plan  ASA: 3  Anesthesia Plan: General   Post-op Pain Management:    Induction: Intravenous  PONV Risk Score and Plan: 2 and Propofol infusion, TIVA and Treatment may vary due to age or medical condition  Airway Management Planned: Natural Airway  Additional Equipment:   Intra-op Plan:   Post-operative Plan:   Informed Consent: I have reviewed the patients History and Physical, chart, labs and discussed the procedure including the risks, benefits and alternatives for the proposed anesthesia with the patient or authorized representative who has indicated his/her understanding and acceptance.       Plan Discussed with: CRNA  Anesthesia Plan Comments:         Anesthesia Quick Evaluation

## 2021-03-22 NOTE — Op Note (Signed)
[facility]astroenter[facility] Patient Name: [name] Procedure Date: 03/22/2021 9:11 AM MRN: [id number] Account #: [id number] Date of Birth: [birth year] Admit Type: Outpatient Age: 55 Room: [facility] ENDO ROOM 2 Gender: Male Note Status: Finalized Instrument Name: Upper Endoscope [id number] Procedure:             Upper GI endoscopy Indications:           Follow-up of esophageal reflux Providers:             Jonathon Bellows MD, MD Referring MD:          No Local Md, MD (Referring MD) Medicines:             Monitored Anesthesia Care Complications:         No immediate complications. Procedure:             Pre-Anesthesia Assessment:                        - Prior to the procedure, a History and Physical was                         performed, and patient medications, allergies and                         sensitivities were reviewed. The patient's tolerance                         of previous anesthesia was reviewed.                        - The risks and benefits of the procedure and the                         sedation options and risks were discussed with the                         patient. All questions were answered and informed                         consent was obtained.                        - ASA Grade Assessment: II - A patient with mild                         systemic disease.                        After obtaining informed consent, the endoscope was                         passed under direct vision. Throughout the procedure,                         the patient's blood pressure, pulse, and oxygen                         saturations were monitored continuously. The Endoscope  was introduced through the mouth, and advanced to the                         third part of duodenum. The upper GI endoscopy was                         accomplished with ease. The patient tolerated the                         procedure well. Findings:      The  esophagus was normal.      The stomach was normal.      The examined duodenum was normal. Impression:            - Normal esophagus.                        - Normal stomach.                        - Normal examined duodenum.                        - No specimens collected. Recommendation:        - Discharge patient to home (with escort).                        - Resume previous diet.                        - Continue present medications.                        - Return to my office as previously scheduled. Procedure Code(s):     --- Professional ---                        [phone], Esophagogastroduodenoscopy, flexible,                         transoral; diagnostic, including collection of                         specimen(s) by brushing or washing, when performed                         (separate procedure) Diagnosis Code(s):     --- Professional ---                        K21.9, Gastro-esophageal reflux disease without                         esophagitis CPT copyright 2019 American Medical Association. All rights reserved. The codes documented in this report are preliminary and upon coder review may  be revised to meet current compliance requirements. Jonathon Bellows, MD Jonathon Bellows MD, MD 03/22/2021 9:36:36 AM This report has been signed electronically. Number of Addenda: 0 Note Initiated On: 03/22/2021 9:11 AM Estimated Blood Loss:  Estimated blood loss: none.      [facility]

## 2021-03-23 ENCOUNTER — Encounter: Payer: Self-pay | Admitting: Gastroenterology

## 2021-05-18 ENCOUNTER — Telehealth: Payer: Self-pay | Admitting: Gastroenterology

## 2021-05-18 NOTE — Telephone Encounter (Signed)
LVM for patient to reschedule appt. Dr. Vicente Males is out of town.

## 2021-05-19 ENCOUNTER — Ambulatory Visit: Payer: Self-pay | Admitting: Gastroenterology

## 2021-07-06 ENCOUNTER — Ambulatory Visit (INDEPENDENT_AMBULATORY_CARE_PROVIDER_SITE_OTHER): Payer: No Typology Code available for payment source | Admitting: Gastroenterology

## 2021-07-06 ENCOUNTER — Other Ambulatory Visit: Payer: Self-pay

## 2021-07-06 ENCOUNTER — Encounter: Payer: Self-pay | Admitting: Gastroenterology

## 2021-07-06 VITALS — BP 129/87 | HR 83 | Temp 97.3°F | Wt 309.0 lb

## 2021-07-06 DIAGNOSIS — K3184 Gastroparesis: Secondary | ICD-10-CM

## 2021-07-06 DIAGNOSIS — K219 Gastro-esophageal reflux disease without esophagitis: Secondary | ICD-10-CM | POA: Diagnosis not present

## 2021-07-06 DIAGNOSIS — R195 Other fecal abnormalities: Secondary | ICD-10-CM

## 2021-07-06 DIAGNOSIS — F331 Major depressive disorder, recurrent, moderate: Secondary | ICD-10-CM | POA: Insufficient documentation

## 2021-07-06 MED ORDER — PANTOPRAZOLE SODIUM 20 MG PO TBEC: 20.0000 mg | DELAYED_RELEASE_TABLET | Freq: Two times a day (BID) | ORAL | 11 refills | Status: AC

## 2021-07-06 MED ORDER — NA SULFATE-K SULFATE-MG SULF 17.5-3.13-1.6 GM/177ML PO SOLN: 354.0000 mL | Freq: Once | ORAL | 0 refills | Status: AC

## 2021-07-06 NOTE — Patient Instructions (Addendum)
Please decrease your Pantoprazole to 20 MG a twice a day.  Gastroparesis Gastroparesis is a condition in which food takes longer than normal to empty from the stomach. This condition is also known as delayed gastric emptying. It is usually a long-term (chronic) condition. There is no cure, but there are treatments and things that you can do at home to help relieve symptoms. Treating the underlying condition that causes gastroparesis can also help relieve symptoms. What are the causes? In many cases, the cause of this condition is not known. Possible causes include: A hormone (endocrine) disorder, such as hypothyroidism or diabetes. A nervous system disease, such as Parkinson's disease or multiple sclerosis. Cancer, infection, or surgery that affects the stomach or vagus nerve. The vagus nerve runs from your chest, through your neck, and to the lower part of your brain. A connective tissue disorder, such as scleroderma. Certain medicines. What increases the risk? You are more likely to develop this condition if: You have certain disorders or diseases. These may include: An endocrine disorder. An eating disorder. Amyloidosis. Scleroderma. Parkinson's disease. Multiple sclerosis. Cancer or infection of the stomach or the vagus nerve. You have had surgery on your stomach or vagus nerve. You take certain medicines. You are male. What are the signs or symptoms? Symptoms of this condition include: Feeling full after eating very little or a loss of appetite. Nausea, vomiting, or heartburn. Bloating of your abdomen. Inconsistent blood sugar (glucose) levels on blood tests. Unexplained weight loss. Acid from the stomach coming up into the esophagus (gastroesophageal reflux). Sudden tightening (spasm) of the stomach, which can be painful. Symptoms may come and go. Some people may not notice any symptoms. How is this diagnosed? This condition is diagnosed with tests, such as: Tests that  check how long it takes food to move through the stomach and intestines. These tests include: Upper gastrointestinal (GI) series. For this test, you drink a liquid that shows up well on X-rays, and then X-rays are taken of your intestines. Gastric emptying scintigraphy. For this test, you eat food that contains a small amount of radioactive material, and then scans are taken. Wireless capsule GI monitoring system. For this test, you swallow a pill (capsule) that records information about how foods and fluid move through your stomach. Gastric manometry. For this test, a tube is passed down your throat and into your stomach to measure electrical and muscular activity. Endoscopy. For this test, a long, thin tube with a camera and light on the end is passed down your throat and into your stomach to check for problems in your stomach lining. Ultrasound. This test uses sound waves to create images of the inside of your body. This can help rule out gallbladder disease or pancreatitis as a cause of your symptoms. How is this treated? There is no cure for this condition, but treatment and home care may relieve symptoms. Treatment may include: Treating the underlying cause. Managing your symptoms by making changes to your diet and exercise habits. Taking medicines to control nausea and vomiting and to stimulate stomach muscles. Getting food through a feeding tube in the hospital. This may be done in severe cases. Having surgery to insert a device called a gastric electrical stimulator into your body. This device helps improve stomach emptying and control nausea and vomiting. Follow these instructions at home: Take over-the-counter and prescription medicines only as told by your health care provider. Follow instructions from your health care provider about eating or drinking restrictions. Your health care  provider may recommend that you: Eat smaller meals more often. Eat low-fat foods. Eat low-fiber forms of  high-fiber foods. For example, eat cooked vegetables instead of raw vegetables. Have only liquid foods instead of solid foods. Liquid foods are easier to digest. Drink enough fluid to keep your urine pale yellow. Exercise as often as told by your health care provider. Keep all follow-up visits. This is important. Contact a health care provider if you: Notice that your symptoms do not improve with treatment. Have new symptoms. Get help right away if you: Have severe pain in your abdomen that does not improve with treatment. Have nausea that is severe or does not go away. Vomit every time you drink fluids. Summary Gastroparesis is a long-term (chronic) condition in which food takes longer than normal to empty from the stomach. Symptoms include nausea, vomiting, heartburn, bloating of your abdomen, and loss of appetite. Eating smaller portions, low-fat foods, and low-fiber forms of high-fiber foods may help you manage your symptoms. Get help right away if you have severe pain in your abdomen. This information is not intended to replace advice given to you by your health care provider. Make sure you discuss any questions you have with your health care provider. Document Revised: 09/29/2019 Document Reviewed: 09/29/2019 Elsevier Patient Education  2022 [employer].

## 2021-07-06 NOTE — Progress Notes (Signed)
Jonathon Bellows MD, MRCP(U.K) [address]  [place]  [place], [place] [address]  Main: [phone]  Fax: [phone]   Primary Care Physician: [facility]  Primary Gastroenterologist:  Dr. Jonathon Bellows   Chief Complaint  Patient presents with   Gastroesophageal Reflux   stool occult    HPI: [name] is a 56 y.o. male    Summary of history : Initially referred and seen on 02/17/2021 for GERD with esophagitis.  Previously seen by gastroenterologist in [place] in 2018  At that point of time he had just recovered from a gastrointestinal bleed when he was hospitalized and an EGD showed grade B esophagitis and a Schatzki's ring.  clean based gastric ulcers were also noted along with antral gastritis.  Biopsies showed chronic inactive gastritis negative H. pylori.    Last colonoscopy was in 2017 tubular adenomas x3 3 to 5 mm in size and he was suggested to have a 5-year follow-up.  Subsequently had a colonoscopy at [facility] and was told everything was good and does not require repeat anytime soon. He has been on Protonix twice a day takes it before his meals ,still had breakthrough heartburn symptoms.  Has lost about 70 pounds of weight intentionally.  Interval history   02/17/2021-07/06/2021   03/08/2021: EGD: Food found in the lower third of esophagus large amount of food seen in the stomach procedure had to be discontinued plan was repeat endoscopy 2 weeks due to poor progression with 24 hours of clears  03/22/2021: EGD: EGD normal Since his last visit he said he has been doing very well in terms of his reflux no symptoms takes Protonix twice a day 40 mg twice daily along with famotidine at night.  HbA1c 6.1.  No unintentional weight loss.  He states that he had a fit test at the [place] and was positive and was referred back to see [place] for a colonoscopy.  Denies any blood in stool.  He is on Plavix.   Current Outpatient Medications  Medication Sig Dispense Refill    allopurinol (ZYLOPRIM) 100 MG tablet Take 1 tablet by mouth daily.     Ascorbic Acid 500 MG CHEW Chew 1 tablet by mouth daily.     aspirin EC 81 MG tablet Take 1 tablet (81 mg total) by mouth daily. 90 tablet 3   buprenorphine (BUTRANS) 10 MCG/HR PTWK Place 1 patch onto the skin once a week.     buPROPion (WELLBUTRIN XL) 150 MG 24 hr tablet Take 1 tablet by mouth daily.     cetirizine (ZYRTEC) 10 MG tablet Take 1 tablet by mouth 2 (two) times daily.     Cholecalciferol 50 MCG (2000 UT) CAPS Take by mouth.     clopidogrel (PLAVIX) 75 MG tablet Take 1 tablet by mouth daily.     cyanocobalamin (,VITAMIN B-12,) 1000 MCG/ML injection Inject 1,000 mcg into the muscle 1 day or 1 dose.     diazepam (VALIUM) 5 MG tablet      DULoxetine (CYMBALTA) 30 MG capsule Take 1 capsule by mouth in the morning and at bedtime.     empagliflozin (JARDIANCE) 25 MG TABS tablet Take 1 tablet by mouth daily.     enoxaparin (LOVENOX) 40 MG/0.4ML injection Inject into the skin.     ezetimibe (ZETIA) 10 MG tablet Take 1 tablet by mouth daily.     ferrous sulfate 325 (65 FE) MG tablet TAKE ONE TABLET BY MOUTH EVERY OTHER DAY - TAKE  WITH VITAMIN C     FLUoxetine (PROZAC) 20 MG capsule      furosemide (LASIX) 20 MG tablet Take 20 mg by mouth.     furosemide (LASIX) 20 MG tablet Take 20 mg by mouth.     furosemide (LASIX) 40 MG tablet Take 40 mg by mouth.     glucose 4 GM chewable tablet Chew by mouth.     HYDROmorphone (DILAUDID) 2 MG tablet Take 1 tablet (2 mg total) by mouth every 4 (four) hours as needed for severe pain. 30 tablet 0   KRILL OIL PO Take by mouth.     loratadine-pseudoephedrine (CLARITIN-D 12-HOUR) 5-120 MG tablet Take 1 tablet by mouth 2 (two) times daily.     metformin (FORTAMET) 500 MG (OSM) 24 hr tablet Take 1 tablet by mouth.     metoCLOPramide (REGLAN) 10 MG tablet TAKE ONE TABLET BY MOUTH TWICE A DAY TAKE 1 EACH MORNING AND 1 EACH EVENING TO HELP STOMACH EMPTY AND DECREASE  NAUSEA.     metolazone  (ZAROXOLYN) 2.5 MG tablet TAKE ONE TABLET BY MOUTH DAILY AS NEEDED FOR WEIGHT UP 5 LBS OR MORE     metoprolol succinate (TOPROL-XL) 100 MG 24 hr tablet Take 1 tablet by mouth daily.     naloxone (NARCAN) 2 MG/2ML injection CALL 911. For suspected opioid overdose, spray 1 mL in each nostril.  Repeat after 3 minutes if no or minimal response.     naproxen sodium (ALEVE) 220 MG tablet Take 220 mg by mouth.     nitroGLYCERIN (NITROSTAT) 0.4 MG SL tablet Place under the tongue as needed.     oxymetazoline (AFRIN) 0.05 % nasal spray Place 1 spray into both nostrils 2 (two) times daily.     pantoprazole (PROTONIX) 40 MG tablet in the morning and at bedtime.     Phenylephrine-APAP-Guaifenesin (TYLENOL SINUS SEVERE PO) Take by mouth.     promethazine (PHENERGAN) 12.5 MG tablet Take 12.5 mg by mouth every 6 (six) hours as needed. for nausea  1   promethazine (PHENERGAN) 25 MG tablet Take 1 tablet (25 mg total) by mouth every 8 (eight) hours as needed for nausea or vomiting. 20 tablet 0   Semaglutide-Weight Management 0.5 MG/0.5ML SOAJ Inject 0.5 mg into the skin.     sertraline (ZOLOFT) 50 MG tablet Take 1 tablet by mouth daily.     spironolactone (ALDACTONE) 25 MG tablet Take 0.5 tablets by mouth daily.     tamsulosin (FLOMAX) 0.4 MG CAPS capsule TAKE ONE CAPSULE BY MOUTH DAILY 30 MINUTES AFTER LAST MEAL OF THE DAY     venlafaxine XR (EFFEXOR-XR) 37.5 MG 24 hr capsule TAKE ONE CAPSULE BY MOUTH DAILY FOR 14 DAYS     potassium chloride SA (KLOR-CON M20) 20 MEQ tablet Take 1.5 tablets (30 mEq total) by mouth daily. 135 tablet 3   No current facility-administered medications for this visit.    Allergies as of 07/06/2021 - Review Complete 03/22/2021  Allergen Reaction Noted   Bupropion  02/04/2021   Demerol [meperidine] Anaphylaxis 07/09/2012   Fluoxetine Other (See Comments) 02/04/2021   Amitriptyline Other (See Comments) 05/19/2019   Oxycodone Rash 07/09/2012   Sucralfate Nausea Only, Other (See  Comments), and Nausea And Vomiting 03/31/2020   Demerol [meperidine hcl]  02/20/2002   Oxycodone-acetaminophen  02/20/2002   Sertraline Itching 02/17/2021    ROS:  General: Negative for anorexia, weight loss, fever, chills, fatigue, weakness. ENT: Negative for hoarseness, difficulty swallowing , nasal congestion. CV: Negative  for chest pain, angina, palpitations, dyspnea on exertion, peripheral edema.  Respiratory: Negative for dyspnea at rest, dyspnea on exertion, cough, sputum, wheezing.  GI: See history of present illness. GU:  Negative for dysuria, hematuria, urinary incontinence, urinary frequency, nocturnal urination.  Endo: Negative for unusual weight change.    Physical Examination:   BP 129/87    Pulse 83    Temp (!) 97.3 F (36.3 C) (Oral)    Wt (!) 309 lb (140.2 kg)    BMI 39.14 kg/m   General: Well-nourished, well-developed in no acute distress.  Eyes: No icterus. Conjunctivae pink. Mouth: Oropharyngeal mucosa moist and pink , no lesions erythema or exudate. Lungs: Clear to auscultation bilaterally. Non-labored. Heart: Regular rate and rhythm, no murmurs rubs or gallops.  Abdomen: Bowel sounds are normal, nontender, nondistended, no hepatosplenomegaly or masses, no abdominal bruits or hernia , no rebound or guarding.   Extremities: No lower extremity edema. No clubbing or deformities. Neuro: Alert and oriented x 3.  Grossly intact. Skin: Warm and dry, no jaundice.   Psych: Alert and cooperative, normal mood and affect.   Imaging Studies: No results found.  Assessment and Plan:   ASHTIAN VILLACIS is a 56 y.o. y/o male  here to see me for acid reflux and esophagitis.  I performed an EGD in October 2022 and there was food in the stomach despite fasting overnight.  Likely has underlying gastroparesis.  Repeat EGD did not show any evidence of gastric outlet obstruction or Barrett's esophagus or esophagitis. He may have diabetic gastroparesis   Plan 1.  Gastroparesis  diet.  Avoid eating for 2 hours before bedtime and colectomy controlled with aim to keep blood sugars over 150 mg/dL.  And he has symptoms of reflux or bloating or abdominal distention to commence on liquid diet and avoid food with high fiber content to allow emptying of the gastric contents  2.  Decrease dose of Protonix from 40 mg twice daily to 20 mg twice daily along with famotidine to see if he can tolerate a lower dose and be asymptomatic.  If doing well in 6 months we will plan to reduce the dose of Prilosec to 20 mg once a day as he is adhering to lifestyle changes  3.  Fit test positive proceed with colonoscopy  Will need Plavix holding instructions   I have discussed alternative options, risks & benefits,  which include, but are not limited to, bleeding, infection, perforation,respiratory complication & drug reaction.  The patient agrees with this plan & written consent will be obtained.     Dr Jonathon Bellows  MD,MRCP [facility]) Follow up in 6 months

## 2021-07-07 ENCOUNTER — Telehealth: Payer: Self-pay

## 2021-07-07 MED ORDER — GOLYTELY 236 G PO SOLR: 4000.0000 mL | Freq: Once | ORAL | 0 refills | Status: AC

## 2021-07-07 NOTE — Telephone Encounter (Signed)
[facility] left a voicemail called and states the suprep is not covered on insurance. They need Golytely called in for patient. Sent Goltely to the pharmacy per pharmacy request. Sent patient a mychart message on how to take it

## 2021-07-18 ENCOUNTER — Encounter
Admission: RE | Disposition: A | Discharge status: Home / Self Care | Payer: Self-pay | Source: Ambulatory Visit | Attending: Gastroenterology

## 2021-07-18 ENCOUNTER — Ambulatory Visit
Admission: RE | Admit: 2021-07-18 | Discharge: 2021-07-18 | Disposition: A | Discharge status: Home / Self Care | Payer: No Typology Code available for payment source | Source: Ambulatory Visit | Attending: Gastroenterology | Admitting: Gastroenterology

## 2021-07-18 ENCOUNTER — Encounter: Payer: Self-pay | Admitting: Gastroenterology

## 2021-07-18 ENCOUNTER — Ambulatory Visit: Payer: No Typology Code available for payment source | Admitting: Certified Registered"

## 2021-07-18 DIAGNOSIS — Z87891 Personal history of nicotine dependence: Secondary | ICD-10-CM | POA: Diagnosis not present

## 2021-07-18 DIAGNOSIS — D128 Benign neoplasm of rectum: Secondary | ICD-10-CM | POA: Diagnosis not present

## 2021-07-18 DIAGNOSIS — D122 Benign neoplasm of ascending colon: Secondary | ICD-10-CM | POA: Diagnosis not present

## 2021-07-18 DIAGNOSIS — R195 Other fecal abnormalities: Secondary | ICD-10-CM | POA: Insufficient documentation

## 2021-07-18 DIAGNOSIS — K3184 Gastroparesis: Secondary | ICD-10-CM

## 2021-07-18 DIAGNOSIS — K635 Polyp of colon: Secondary | ICD-10-CM

## 2021-07-18 HISTORY — DX: Personal history of urinary calculi: Z87.442

## 2021-07-18 HISTORY — PX: COLONOSCOPY WITH PROPOFOL: SHX5780

## 2021-07-18 LAB — GLUCOSE, CAPILLARY: Glucose-Capillary: 103 mg/dL — ABNORMAL HIGH (ref 70–99)

## 2021-07-18 SURGERY — COLONOSCOPY WITH PROPOFOL
Anesthesia: General

## 2021-07-18 MED ORDER — SODIUM CHLORIDE 0.9 % IV SOLN
INTRAVENOUS | Status: DC
  Administered 2021-07-18: 1000 mL via INTRAVENOUS

## 2021-07-18 MED ORDER — DEXMEDETOMIDINE (PRECEDEX) IN NS 20 MCG/5ML (4 MCG/ML) IV SYRINGE
PREFILLED_SYRINGE | INTRAVENOUS | Status: DC | PRN
  Administered 2021-07-18: 12 ug via INTRAVENOUS

## 2021-07-18 MED ORDER — LIDOCAINE HCL (CARDIAC) PF 100 MG/5ML IV SOSY
PREFILLED_SYRINGE | INTRAVENOUS | Status: DC | PRN
  Administered 2021-07-18: 50 mg via INTRAVENOUS

## 2021-07-18 MED ORDER — PROPOFOL 500 MG/50ML IV EMUL
INTRAVENOUS | Status: DC | PRN
  Administered 2021-07-18: 150 ug/kg/min via INTRAVENOUS

## 2021-07-18 MED ORDER — PROPOFOL 10 MG/ML IV BOLUS
INTRAVENOUS | Status: DC | PRN
  Administered 2021-07-18: 70 mg via INTRAVENOUS

## 2021-07-18 MED ORDER — PROPOFOL 500 MG/50ML IV EMUL
INTRAVENOUS | Status: AC
  Filled 2021-07-18: qty 50

## 2021-07-18 NOTE — H&P (Signed)
Jonathon Bellows, MD [address] [place], [place], [place], [address] [address], [place], [place], [place], [address] Phone: [phone]  Fax: [phone]  Primary Care Physician:  [facility]   Pre-Procedure History & Physical: HPI:  [name] is a 56 y.o. male is here for an colonoscopy.   Past Medical History:  Diagnosis Date   Abnormal CT scan, kidney    mass on right kidney, will have biopsy on NOv 3rd   CHF (congestive heart failure) (HCC)    CKD (chronic kidney disease)    Complication of anesthesia    Diabetes mellitus without complication (HCC)    Diabetic retinopathy ([place])    Gastroparesis    History of Doppler ultrasound    Carotid US 11/17: Stable 1-39% bilateral ICA stenosis; Normal subclavian arteries, bilaterally.   History of echocardiogram    Echo 11/17: EF 60-65, normal wall motion, grade 1 diastolic dysfunction, aortic sclerosis without stenosis, trivial MR, normal RVSF, moderate RAE   History of kidney stones    Hyperlipidemia 02/21/2014   diet controlled, no medications   Left acetabular fracture ([place])    nonoperative around age 47   Sleep apnea, unspecified 02/17/2021   Stroke ([place]) 02/21/2014   TIA - no limitaions from stroke   Ulcer    Hx - no longer a problem per patient    Past Surgical History:  Procedure Laterality Date   CARDIAC CATHETERIZATION     colonscopy     CORONARY ANGIOPLASTY     COSMETIC SURGERY     Dermal tatoo removal face from accident   ESOPHAGOGASTRODUODENOSCOPY N/A 08/07/2016   Procedure: ESOPHAGOGASTRODUODENOSCOPY (EGD);  Surgeon: Manus Gunning, MD;  Location: [facility];  Service: Gastroenterology;  Laterality: N/A;   ESOPHAGOGASTRODUODENOSCOPY (EGD) WITH PROPOFOL N/A 03/08/2021   Procedure: ESOPHAGOGASTRODUODENOSCOPY (EGD) WITH PROPOFOL;  Surgeon: Jonathon Bellows, MD;  Location: [facility] ENDOSCOPY;  Service: Gastroenterology;  Laterality: N/A;   ESOPHAGOGASTRODUODENOSCOPY (EGD) WITH PROPOFOL  N/A 03/22/2021   Procedure: ESOPHAGOGASTRODUODENOSCOPY (EGD) WITH PROPOFOL;  Surgeon: Jonathon Bellows, MD;  Location: [facility] ENDOSCOPY;  Service: Gastroenterology;  Laterality: N/A;   KNEE ARTHROSCOPY W/ MENISCAL REPAIR     Both knees   RIGHT/LEFT HEART CATH AND CORONARY ANGIOGRAPHY N/A 08/01/2016   Procedure: Right/Left Heart Cath and Coronary Angiography;  Surgeon: Sherren Mocha, MD;  Location: [facility];  Service: Cardiovascular;  Laterality: N/A;   TONSILLECTOMY     UPPER GASTROINTESTINAL ENDOSCOPY     hx ulcer, no longer a problem per patient   WISDOM TOOTH EXTRACTION      Prior to Admission medications   Medication Sig Start Date End Date Taking? Authorizing Provider  Ascorbic Acid 500 MG CHEW Chew 1 tablet by mouth daily. 11/12/20  Yes [provider]  cetirizine (ZYRTEC) 10 MG tablet Take 1 tablet by mouth 2 (two) times daily. 10/13/19  Yes [provider]  Cholecalciferol 50 MCG (2000 UT) CAPS Take by mouth.   Yes [provider]  cyanocobalamin (,VITAMIN B-12,) 1000 MCG/ML injection Inject 1,000 mcg into the muscle 1 day or 1 dose.   Yes [provider]  empagliflozin (JARDIANCE) 25 MG TABS tablet Take 1 tablet by mouth daily. 03/24/21  Yes [provider]  ezetimibe (ZETIA) 10 MG tablet Take 1 tablet by mouth daily. 08/11/20 08/12/21 Yes [provider]  ferrous sulfate 325 (65 FE) MG tablet TAKE ONE TABLET BY MOUTH EVERY OTHER DAY - TAKE WITH VITAMIN C 11/12/20  Yes  [provider]  furosemide (LASIX) 20 MG tablet Take 20 mg by mouth.   Yes [provider]  furosemide (LASIX) 40 MG tablet Take 40 mg by mouth.   Yes [provider]  KRILL OIL PO Take by mouth.   Yes [provider]  loratadine-pseudoephedrine (CLARITIN-D 12-HOUR) 5-120 MG tablet Take 1 tablet by mouth 2 (two) times daily.   Yes [provider]  metoCLOPramide (REGLAN) 10 MG tablet TAKE ONE TABLET BY MOUTH TWICE A DAY  TAKE 1 EACH MORNING AND 1 EACH EVENING TO HELP STOMACH EMPTY AND DECREASE  NAUSEA. 12/20/20 12/21/21 Yes [provider]  metoprolol succinate (TOPROL-XL) 100 MG 24 hr tablet Take 1 tablet by mouth daily. 03/07/21 08/12/21 Yes [provider]  pantoprazole (PROTONIX) 20 MG tablet Take 1 tablet (20 mg total) by mouth 2 (two) times daily before a meal. 07/06/21  Yes Jonathon Bellows, MD  Semaglutide-Weight Management 0.5 MG/0.5ML SOAJ Inject 0.5 mg into the skin.   Yes [provider]  tamsulosin (FLOMAX) 0.4 MG CAPS capsule TAKE ONE CAPSULE BY MOUTH DAILY 30 MINUTES AFTER LAST MEAL OF THE DAY 02/02/21  Yes [provider]  venlafaxine XR (EFFEXOR-XR) 37.5 MG 24 hr capsule TAKE ONE CAPSULE BY MOUTH DAILY FOR 14 DAYS 02/11/21  Yes [provider]  allopurinol (ZYLOPRIM) 100 MG tablet Take 1 tablet by mouth daily. Patient not taking: Reported on 07/18/2021    [provider]  aspirin EC 81 MG tablet Take 1 tablet (81 mg total) by mouth daily. Patient not taking: Reported on 07/18/2021 04/11/16   Richardson Dopp T, PA-C  buPROPion (WELLBUTRIN XL) 150 MG 24 hr tablet Take 1 tablet by mouth daily. Patient not taking: Reported on 07/18/2021 01/20/21 12/23/21  [provider]  clopidogrel (PLAVIX) 75 MG tablet Take 1 tablet by mouth daily.    [provider]  diazepam (VALIUM) 5 MG tablet  04/07/20   [provider]  DULoxetine (CYMBALTA) 30 MG capsule Take 1 capsule by mouth in the morning and at bedtime. Patient not taking: Reported on 07/18/2021 08/04/20 08/05/21  [provider]  enoxaparin (LOVENOX) 40 MG/0.4ML injection Inject into the skin. Patient not taking: Reported on 07/18/2021    [provider]  FLUoxetine (PROZAC) 20 MG capsule  10/13/20 10/14/21  [provider]  furosemide (LASIX) 20 MG tablet Take 20 mg by mouth.    [provider]  glucose 4 GM chewable tablet Chew by mouth. 08/26/20   [provider]  HYDROmorphone (DILAUDID) 2 MG tablet Take 1 tablet (2 mg total) by mouth every 4 (four) hours as needed for severe pain. Patient not taking: Reported on 07/18/2021 07/12/17   Susy Frizzle, MD  metformin (FORTAMET) 500 MG (OSM) 24 hr tablet Take 1 tablet by mouth.    [provider]  metolazone (ZAROXOLYN) 2.5 MG tablet TAKE ONE TABLET BY MOUTH DAILY AS NEEDED FOR WEIGHT UP 5 LBS OR MORE 02/12/20 08/12/21  [provider]  naloxone (NARCAN) 2 MG/2ML injection CALL 911. For suspected opioid overdose, spray 1 mL in each nostril.  Repeat after 3 minutes if no or minimal response. 02/09/21   [provider]  naproxen sodium (ALEVE) 220 MG tablet Take 220 mg by mouth. Patient not taking: Reported on 07/18/2021    [provider]  nitroGLYCERIN (NITROSTAT) 0.4 MG SL tablet Place under the tongue as needed.    [provider]  oxymetazoline (AFRIN) 0.05 % nasal spray  Place 1 spray into both nostrils 2 (two) times daily. Patient not taking: Reported on 07/18/2021    [provider]  Phenylephrine-APAP-Guaifenesin (TYLENOL SINUS SEVERE PO) Take by mouth.    [provider]  potassium chloride SA (KLOR-CON M20) 20 MEQ tablet Take 1.5 tablets (30 mEq total) by mouth daily. 06/01/16 03/22/21  Richardson Dopp T, PA-C  promethazine (PHENERGAN) 12.5 MG tablet Take 12.5 mg by mouth every 6 (six) hours as needed. for nausea 09/07/17   [provider]  promethazine (PHENERGAN) 25 MG tablet Take 1 tablet (25 mg total) by mouth every 8 (eight) hours as needed for nausea or vomiting. 06/19/17   Susy Frizzle, MD  sertraline (ZOLOFT) 50 MG tablet Take 1 tablet by mouth daily. Patient not taking: Reported on 07/18/2021 02/10/21   [provider]  spironolactone (ALDACTONE) 25 MG tablet Take 0.5 tablets by mouth daily. Patient not taking: Reported on 07/18/2021 11/03/20 11/04/21  [provider]    Allergies as of 07/06/2021 -  Review Complete 03/22/2021  Allergen Reaction Noted   Bupropion  02/04/2021   Demerol [meperidine] Anaphylaxis 07/09/2012   Fluoxetine Other (See Comments) 02/04/2021   Amitriptyline Other (See Comments) 05/19/2019   Oxycodone Rash 07/09/2012   Sucralfate Nausea Only, Other (See Comments), and Nausea And Vomiting 03/31/2020   Demerol [meperidine hcl]  02/20/2002   Oxycodone-acetaminophen  02/20/2002   Sertraline Itching 02/17/2021    Family History  Problem Relation Age of Onset   Hyperlipidemia Mother    Hypertension Mother    Heart disease Father    Hyperlipidemia Father    Hypertension Father    Heart attack Father    Heart disease Sister    Hyperlipidemia Sister    Hypertension Sister    Hyperlipidemia Brother    Hypertension Brother    Diabetes Brother    Colon polyps Brother    Cancer Maternal Grandmother    Hearing loss Maternal Grandmother    Hypertension Maternal Grandmother    Esophageal cancer Maternal Grandmother    Cancer Maternal Grandfather    Colon cancer Maternal Grandfather    Cancer Paternal Grandmother    Heart disease Paternal Grandmother    Hypertension Paternal Grandmother    Cancer Paternal Grandfather    Diabetes Brother    Heart disease Brother    Hyperlipidemia Brother    Hypertension Brother    Colon cancer Maternal Uncle    Rectal cancer Neg Hx    Stomach cancer Neg Hx     Social History   Socioeconomic History   Marital status: Single    Spouse name: Not on file   Number of children: Not on file   Years of education: Not on file   Highest education level: Not on file  Occupational History   Occupation: Radiation protection practitioner     Comment: at [employer] Use   Smoking status: Former    Packs/day: 2.00    Types: Cigarettes    Start date: 07/09/1981    Quit date: 07/10/1999    Years since quitting: 22.0   Smokeless tobacco: Never  Vaping Use   Vaping Use: Never used  Substance and Sexual Activity   Alcohol use: No     Alcohol/week: 0.0 standard drinks   Drug use: No    Comment: Mistake made in chart pt has never used meth.   Sexual activity: Never    Birth control/protection: Abstinence    Comment: gay but not sexually active since ~ 2008  Other Topics Concern   Not on file  Social History Narrative   Previous Armed forces logistics/support/administrative officer.    Social Determinants of Health   Financial Resource Strain: Not on file  Food Insecurity: Not on file  Transportation Needs: Not on file  Physical Activity: Not on file  Stress: Not on file  Social Connections: Not on file  Intimate Partner Violence: Not on file    Review of Systems: See HPI, otherwise negative ROS  Physical Exam: BP (!) 139/92    Pulse (!) 113    Temp (!) 97.4 F (36.3 C) (Temporal)    Resp 15    Ht 6\' 2"  (1.88 m)    Wt (!) 140 kg    SpO2 98%    BMI 39.63 kg/m  General:   Alert,  pleasant and cooperative in NAD Head:  Normocephalic and atraumatic. Neck:  Supple; no masses or thyromegaly. Lungs:  Clear throughout to auscultation, normal respiratory effort.    Heart:  +S1, +S2, Regular rate and rhythm, No edema. Abdomen:  Soft, nontender and nondistended. Normal bowel sounds, without guarding, and without rebound.   Neurologic:  Alert and  oriented x4;  grossly normal neurologically.  Impression/Plan: [name] is here for an colonoscopy to be performed for positive FIT test . Risks, benefits, limitations, and alternatives regarding  colonoscopy have been reviewed with the patient.  Questions have been answered.  All parties agreeable.   Jonathon Bellows, MD  07/18/2021, 11:25 AM

## 2021-07-18 NOTE — Anesthesia Postprocedure Evaluation (Signed)
Anesthesia Post Note  Patient: [name]  Procedure(s) Performed: COLONOSCOPY WITH PROPOFOL  Patient location during evaluation: Endoscopy Anesthesia Type: General Level of consciousness: awake and alert Pain management: pain level controlled Vital Signs Assessment: post-procedure vital signs reviewed and stable Respiratory status: spontaneous breathing, nonlabored ventilation, respiratory function stable and patient connected to nasal cannula oxygen Cardiovascular status: blood pressure returned to baseline and stable Postop Assessment: no apparent nausea or vomiting Anesthetic complications: no   No notable events documented.   Last Vitals:  Vitals:   07/18/21 1221 07/18/21 1231  BP: 104/73 109/74  Pulse: 84 85  Resp: 13 13  Temp: (!) 36.1 C   SpO2: 95% 95%    Last Pain:  Vitals:   07/18/21 1231  TempSrc:   PainSc: 0-No pain                 Arita Miss

## 2021-07-18 NOTE — Anesthesia Preprocedure Evaluation (Signed)
Anesthesia Evaluation  Patient identified by MRN, date of birth, ID band Patient awake    Reviewed: Allergy & Precautions, NPO status , Patient's Chart, lab work & pertinent test results  History of Anesthesia Complications Negative for: history of anesthetic complications  Airway Mallampati: II  TM Distance: >3 FB Neck ROM: Full    Dental  (+) Missing, Poor Dentition,    Pulmonary sleep apnea, Continuous Positive Airway Pressure Ventilation and Oxygen sleep apnea , neg COPD, Patient abstained from smoking.Not current smoker, former smoker,    Pulmonary exam normal breath sounds clear to auscultation       Cardiovascular Exercise Tolerance: Good METShypertension, +CHF  (-) CAD and (-) Past MI Normal cardiovascular exam(-) dysrhythmias  Rhythm:Regular Rate:Normal - Systolic murmurs ECG 10/10/82: normal TTE 2020: Study Conclusions   - Left ventricle: The cavity size was normal. Wall thickness was  normal. Systolic function was normal. The estimated ejection  fraction was in the range of 60% to 65%. Wall motion was normal;  there were no regional wall motion abnormalities. Doppler  parameters are consistent with abnormal left ventricular  relaxation (grade 1 diastolic dysfunction). The E/e&' ratio is  between 8-15, suggesting indeterminate LV filling pressure.  - Aortic valve: Trileaflet. Sclerosis without stenosis. There was  no regurgitation.  - Mitral valve: Mildly thickened leaflets . There was trivial  regurgitation.  - Left atrium: The atrium was normal in size.  - Right ventricle: The cavity size was mildly dilated. Systolic  function was normal.  - Right atrium: Moderately dilated.  - Systemic veins: The IVC is not visualized.    Neuro/Psych PSYCHIATRIC DISORDERS Depression Chronic pain TIA (on Plavix)   GI/Hepatic GERD  ,(+)     (-) substance abuse  ,   Endo/Other  diabetesMorbid  obesityObesity   Renal/GU Renal disease (CKD)     Musculoskeletal  (+) Arthritis ,   Abdominal (+) + obese,   Peds  Hematology negative hematology ROS (+)   Anesthesia Other Findings Past Medical History: No date: Abnormal CT scan, kidney     Comment:  mass on right kidney, will have biopsy on NOv 3rd No date: CHF (congestive heart failure) (HCC) No date: CKD (chronic kidney disease) No date: Complication of anesthesia No date: Diabetes mellitus without complication (HCC) No date: Diabetic retinopathy ([place]) No date: Gastroparesis No date: History of Doppler ultrasound     Comment:  Carotid US 11/17: Stable 1-39% bilateral ICA stenosis;               Normal subclavian arteries, bilaterally. No date: History of echocardiogram     Comment:  Echo 11/17: EF 60-65, normal wall motion, grade 1               diastolic dysfunction, aortic sclerosis without stenosis,              trivial MR, normal RVSF, moderate RAE No date: History of kidney stones 02/21/2014: Hyperlipidemia     Comment:  diet controlled, no medications No date: Left acetabular fracture ([place])     Comment:  nonoperative around age 43 02/17/2021: Sleep apnea, unspecified 02/21/2014: Stroke ([place])     Comment:  TIA - no limitaions from stroke No date: Ulcer     Comment:  Hx - no longer a problem per patient  Reproductive/Obstetrics                             Anesthesia Physical  Anesthesia Plan  ASA: 3  Anesthesia Plan: General   Post-op Pain Management: Minimal or no pain anticipated   Induction: Intravenous  PONV Risk Score and Plan: 2 and Propofol infusion, TIVA and Treatment may vary due to age or medical condition  Airway Management Planned: Natural Airway and Nasal CPAP  Additional Equipment: None  Intra-op Plan:   Post-operative Plan:   Informed Consent: I have reviewed the patients History and Physical, chart, labs and discussed the procedure including the risks,  benefits and alternatives for the proposed anesthesia with the patient or authorized representative who has indicated his/her understanding and acceptance.     Dental advisory given  Plan Discussed with: CRNA  Anesthesia Plan Comments: (Discussed risks of anesthesia with patient, including possibility of difficulty with spontaneous ventilation under anesthesia necessitating airway intervention, PONV, and rare risks such as cardiac or respiratory or neurological events, and allergic reactions. Discussed the role of CRNA in patient's perioperative care. Patient understands. Patient informed about increased incidence of above perioperative risk due to high BMI. Patient understands. )        Anesthesia Quick Evaluation

## 2021-07-18 NOTE — Anesthesia Procedure Notes (Signed)
Procedure Name: MAC Date/Time: 07/18/2021 11:44 AM Performed by: Biagio Borg, CRNA Pre-anesthesia Checklist: Patient identified, Emergency Drugs available, Suction available, Patient being monitored and Timeout performed Patient Re-evaluated:Patient Re-evaluated prior to induction Oxygen Delivery Method: Supernova nasal CPAP Induction Type: IV induction Placement Confirmation: positive ETCO2 and CO2 detector

## 2021-07-18 NOTE — Op Note (Signed)
[facility] Gastroenterology Patient Name: [name] Procedure Date: 07/18/2021 11:39 AM MRN: [id number] Account #: [id number] Date of Birth: [birth year] Admit Type: Outpatient Age: 55 Room: [facility] ENDO ROOM 4 Gender: Male Note Status: Finalized Instrument Name: Jasper Riling [id number] Procedure:             Colonoscopy Indications:           Positive fecal immunochemical test Providers:             Jonathon Bellows MD, MD Referring MD:          [facility] (Referring MD) Medicines:             Monitored Anesthesia Care Complications:         No immediate complications. Procedure:             Pre-Anesthesia Assessment:                        - Prior to the procedure, a History and Physical was                         performed, and patient medications, allergies and                         sensitivities were reviewed. The patient's tolerance                         of previous anesthesia was reviewed.                        - The risks and benefits of the procedure and the                         sedation options and risks were discussed with the                         patient. All questions were answered and informed                         consent was obtained.                        - ASA Grade Assessment: II - A patient with mild                         systemic disease.                        After obtaining informed consent, the colonoscope was                         passed under direct vision. Throughout the procedure,                         the patient's blood pressure, pulse, and oxygen                         saturations were monitored continuously. The                         Colonoscope was introduced through  the anus and                         advanced to the the cecum, identified by the                         appendiceal orifice. The colonoscopy was performed                         with ease. The patient tolerated the procedure well.                          The quality of the bowel preparation was good. Findings:      The perianal and digital rectal examinations were normal.      Two sessile polyps were found in the ascending colon. The polyps were 4       to 5 mm in size. These polyps were removed with a cold snare. Resection       and retrieval were complete.      A 7 mm polyp was found in the rectum. The polyp was sessile. The polyp       was removed with a cold snare. Resection and retrieval were complete.      A 3 mm polyp was found in the descending colon. The polyp was sessile.       The polyp was removed with a jumbo cold forceps. Resection and retrieval       were complete.      The exam was otherwise without abnormality on direct and retroflexion       views. Impression:            - Two 4 to 5 mm polyps in the ascending colon, removed                         with a cold snare. Resected and retrieved.                        - One 7 mm polyp in the rectum, removed with a cold                         snare. Resected and retrieved.                        - One 3 mm polyp in the descending colon, removed with                         a jumbo cold forceps. Resected and retrieved.                        - The examination was otherwise normal on direct and                         retroflexion views. Recommendation:        - Discharge patient to home (with escort).                        - Resume previous diet.                        -  Continue present medications.                        - Await pathology results.                        - Repeat colonoscopy for surveillance based on                         pathology results. Procedure Code(s):     --- Professional ---                        [phone], Colonoscopy, flexible; with removal of                         tumor(s), polyp(s), or other lesion(s) by snare                         technique                        [id number], 59, Colonoscopy, flexible; with biopsy, single                          or multiple Diagnosis Code(s):     --- Professional ---                        K63.5, Polyp of colon                        K62.1, Rectal polyp                        R19.5, Other fecal abnormalities CPT copyright 2019 American Medical Association. All rights reserved. The codes documented in this report are preliminary and upon coder review may  be revised to meet current compliance requirements. Jonathon Bellows, MD Jonathon Bellows MD, MD 07/18/2021 12:09:31 PM This report has been signed electronically. Number of Addenda: 0 Note Initiated On: 07/18/2021 11:39 AM Scope Withdrawal Time: 0 hours 19 minutes 39 seconds  Total Procedure Duration: 0 hours 21 minutes 22 seconds  Estimated Blood Loss:  Estimated blood loss: none.      [facility]

## 2021-07-18 NOTE — Transfer of Care (Signed)
Immediate Anesthesia Transfer of Care Note  Patient: [name]  Procedure(s) Performed: COLONOSCOPY WITH PROPOFOL  Patient Location: PACU and Endoscopy Unit  Anesthesia Type:General  Level of Consciousness: drowsy  Airway & Oxygen Therapy: Patient Spontanous Breathing  Post-op Assessment: Report given to RN and Post -op Vital signs reviewed and stable  Post vital signs: Reviewed and stable  Last Vitals:  Vitals Value Taken Time  BP 108/63 07/18/21 1211  Temp    Pulse 86 07/18/21 1212  Resp 14 07/18/21 1212  SpO2 97 % 07/18/21 1212  Vitals shown include unvalidated device data.  Last Pain:  Vitals:   07/18/21 1211  TempSrc:   PainSc: Asleep         Complications: No notable events documented.

## 2021-07-19 ENCOUNTER — Encounter: Payer: Self-pay | Admitting: Gastroenterology

## 2021-07-19 LAB — SURGICAL PATHOLOGY

## 2021-07-24 ENCOUNTER — Encounter: Payer: Self-pay | Admitting: Gastroenterology

## 2022-01-03 ENCOUNTER — Ambulatory Visit: Payer: Non-veteran care | Admitting: Gastroenterology

## 2022-02-22 ENCOUNTER — Ambulatory Visit (INDEPENDENT_AMBULATORY_CARE_PROVIDER_SITE_OTHER): Payer: No Typology Code available for payment source | Admitting: Pulmonary Disease

## 2022-02-22 ENCOUNTER — Encounter: Payer: Self-pay | Admitting: Pulmonary Disease

## 2022-02-22 ENCOUNTER — Ambulatory Visit (INDEPENDENT_AMBULATORY_CARE_PROVIDER_SITE_OTHER): Payer: No Typology Code available for payment source

## 2022-02-22 VITALS — BP 120/74 | HR 99 | Temp 98.0°F | Ht 74.5 in | Wt 310.2 lb

## 2022-02-22 DIAGNOSIS — R0609 Other forms of dyspnea: Secondary | ICD-10-CM

## 2022-02-22 MED ORDER — FLUTICASONE-SALMETEROL 250-50 MCG/ACT IN AEPB: 1.0000 | INHALATION_SPRAY | Freq: Two times a day (BID) | RESPIRATORY_TRACT | 3 refills | Status: DC

## 2022-02-22 NOTE — Progress Notes (Signed)
$'@Patient'o$  ID: [name], male    DOB: 1965/11/02, 56 y.o.   MRN: [id number]  No chief complaint on file.   Referring provider: Joseph Art, MD  HPI:   56 y.o. man whom we are seeing in consultation for evaluation of nocturnal hypoxemia despite OSA treatment with CPAP and oxygen bleed and is well as dyspnea on exertion.  Note x2 from [place] reviewed.  Notes that over the last several months he has been hypoxemic at night.  He is pulse oximeter he wears at night is connected to his phone app and his CPAP.  It demonstrates low oxygen pretty consistently dropping below 90% every night.  He currently is on CPAP therapy with 3 L oxygen bleed in.  He has been in contact with his sleep doctor.  He has an upcoming appointment next month.  If oxygen continues to be low they plan for CPAP titration.  Also with worsening dyspnea over time.  He states that this pretty clearly relates to times when he has extra swelling or fluid overload.  Notes his breathing is worse.  Breathing worse when lying down or supine.  Otherwise, no times a day with things are better or worse.  No other position to make things better or worse.  No other seasonal or environmental factors he can identify to make things better or worse.  No other alleviating or exacerbating factors.  He does endorse worsening nasal allergies this spring for several months.  Historically not much of an issue but they were quite bad.  A lot of nasal congestion, postnasal drip.  Some worsening shortness of breath as well.  He was treated with a prednisone course for some disc issues in the spine and this greatly cleared up his allergic symptoms and help with his breathing some.  He reports recent CT abdomen pelvis images demonstrated atelectasis in the bases.  He knows of no abnormalities on chest imaging recently, no ILD, fibrosis etc.  Diagnosis sleep apnea 01/2017 based on review of CPAP titration study.,  Time not similar water via nasal pillow.  Most  recent chest imaging, chest x-ray 2018 personally reviewed and interpreted as clear lungs bilaterally.  TTE 2017 demonstrated grade 1 diastolic dysfunction, MVR, dilated RV.  Most recent TTE 02/13/2022 demonstrated normal estimated RVSP, normal RV and RA size, normal RV function.  Left heart catheterization in 2018 with LVEDP of 22.  Right heart cath was not pursued as difficulty cannulating due to stenosis in the veins per report on operative note.Marland Kitchen  PMH: Gout, seasonal allergies, diabetes, diastolic dysfunction Surgical history: Tonsillectomy, wisdom tooth extraction Family history: Mother with hyperlipidemia, angina, father with CAD, hyperlipidemia, hypertension Social history: Former smoker, quit around year 2000, lives in [place] / Pulmonary Flowsheets:   ACT:      No data to display          MMRC:     No data to display          Epworth:      No data to display          Tests:   FENO:  No results found for: "NITRICOXIDE"  PFT:     No data to display          WALK:      No data to display          Imaging: Personally reviewed and as per EMR and discussion in this note No results found.  Lab  Results: Personally reviewed CBC    Component Value Date/Time   WBC 7.6 12/14/2016 1159   RBC 4.95 12/14/2016 1159   HGB 14.7 12/14/2016 1159   HGB 14.1 07/27/2016 0853   HCT 42.5 12/14/2016 1159   HCT 42.9 07/27/2016 0853   PLT 246 12/14/2016 1159   PLT 247 07/27/2016 0853   MCV 85.9 12/14/2016 1159   MCV 87 07/27/2016 0853   MCH 29.7 12/14/2016 1159   MCHC 34.6 12/14/2016 1159   RDW 13.9 12/14/2016 1159   RDW 13.6 07/27/2016 0853   LYMPHSABS 2.0 09/21/2016 1033   LYMPHSABS 1.8 07/27/2016 0853   MONOABS 0.6 09/21/2016 1033   EOSABS 0.1 09/21/2016 1033   EOSABS 0.1 07/27/2016 0853   BASOSABS 0.1 09/21/2016 1033   BASOSABS 0.0 07/27/2016 0853    BMET    Component Value Date/Time   NA 143 04/05/2017 1613   NA  142 11/20/2016 0828   K 3.7 04/05/2017 1613   CL 102 04/05/2017 1613   CO2 26 04/05/2017 1613   GLUCOSE 95 04/05/2017 1613   BUN 18 04/05/2017 1613   BUN 22 11/20/2016 0828   CREATININE 1.27 04/05/2017 1613   CALCIUM 10.2 04/05/2017 1613   GFRNONAA 64 04/05/2017 1613   GFRAA 74 04/05/2017 1613    BNP    Component Value Date/Time   BNP 24.9 04/12/2016 0820    ProBNP No results found for: "PROBNP"  Specialty Problems       Pulmonary Problems   Shortness of breath   Sleep apnea, unspecified    Allergies  Allergen Reactions   Bupropion     Other reaction(s): Nightmares, Suicidal thoughts, Nightmares, Suicidal thoughts   Demerol [Meperidine] Anaphylaxis   Fluoxetine Other (See Comments)    Other reaction(s): Nightmares, Suicidal thoughts   Amitriptyline Other (See Comments)    Other reaction(s): Hallucinations   Oxycodone Rash   Sucralfate Nausea Only, Other (See Comments) and Nausea And Vomiting    Other reaction(s): Abdominal Pain   Demerol [Meperidine Hcl]     Other reaction(s): OTHER REACTION   Eplerenone     diarrhea   Metformin And Related     diarrhea   Oxycodone-Acetaminophen     Other reaction(s): HIVES   Sertraline Itching    Other reaction(s): Cough    Immunization History  Administered Date(s) Administered   Influenza,inj,Quad PF,6+ Mos 03/23/2020, 05/16/2021   Moderna Covid-19 Vaccine Bivalent Booster 76yr & up 05/16/2021   Moderna Sars-Covid-2 Vaccination 08/22/2019, 09/19/2019, 06/08/2020   PNEUMOCOCCAL CONJUGATE-20 09/27/2021   Tetanus Immune Globulin 03/06/2015   Zoster Recombinat (Shingrix) 09/27/2021, 01/24/2022    Past Medical History:  Diagnosis Date   Abnormal CT scan, kidney    mass on right kidney, will have biopsy on NOv 3rd   CHF (congestive heart failure) (HCC)    CKD (chronic kidney disease)    Complication of anesthesia    Diabetes mellitus without complication [place]    Diabetic retinopathy ([facility]    Gastroparesis     History of Doppler ultrasound    Carotid [place]: Stable 1-39% bilateral ICA stenosis; Normal subclavian arteries, bilaterally.   History of echocardiogram    Echo 11/17: EF 60-65, normal wall motion, grade 1 diastolic dysfunction, aortic sclerosis without stenosis, trivial MR, normal RVSF, moderate RAE   History of kidney stones    Hyperlipidemia 02/21/2014   diet controlled, no medications   [place]abular fracture ([place] [place]    nonoperative around age 56  Sleep apnea, unspecified 02/17/2021  Stroke ([place]) 02/21/2014   TIA - no limitaions from stroke   Ulcer    Hx - no longer a problem per patient    Tobacco History: Social History   Tobacco Use  Smoking Status Former   Packs/day: 2.00   Types: Cigarettes   Start date: 07/09/1981   Quit date: 07/10/1999   Years since quitting: 22.6  Smokeless Tobacco Never   Counseling given: Not Answered   Continue to not smoke  Outpatient Encounter Medications as of 02/22/2022  Medication Sig   Ascorbic Acid 500 MG CHEW Chew 1 tablet by mouth daily.   cetirizine (ZYRTEC) 10 MG tablet Take 1 tablet by mouth 2 (two) times daily.   Cholecalciferol 50 MCG (2000 UT) CAPS Take by mouth.   clopidogrel (PLAVIX) 75 MG tablet Take 1 tablet by mouth daily.   cyanocobalamin (,VITAMIN B-12,) 1000 MCG/ML injection Inject 1,000 mcg into the muscle 1 day or 1 dose.   empagliflozin (JARDIANCE) 25 MG TABS tablet Take 1 tablet by mouth daily.   ferrous sulfate 325 (65 FE) MG tablet TAKE ONE TABLET BY MOUTH EVERY OTHER DAY - TAKE WITH VITAMIN C   fluticasone-salmeterol (ADVAIR DISKUS) 250-50 MCG/ACT AEPB Inhale 1 puff into the lungs in the morning and at bedtime.   furosemide (LASIX) 20 MG tablet Take 20 mg by mouth.   furosemide (LASIX) 20 MG tablet Take 20 mg by mouth.   furosemide (LASIX) 40 MG tablet Take 40 mg by mouth.   glucose 4 GM chewable tablet Chew by mouth.   KRILL OIL PO Take by mouth.   loratadine-pseudoephedrine (CLARITIN-D 12-HOUR) 5-120 MG  tablet Take 1 tablet by mouth 2 (two) times daily.   metformin (FORTAMET) 500 MG (OSM) 24 hr tablet Take 1 tablet by mouth.   naloxone (NARCAN) 2 MG/2ML injection CALL 911. For suspected opioid overdose, spray 1 mL in each nostril.  Repeat after 3 minutes if no or minimal response.   nitroGLYCERIN (NITROSTAT) 0.4 MG SL tablet Place under the tongue as needed.   pantoprazole (PROTONIX) 20 MG tablet Take 1 tablet (20 mg total) by mouth 2 (two) times daily before a meal.   Phenylephrine-APAP-Guaifenesin (TYLENOL SINUS SEVERE PO) Take by mouth.   promethazine (PHENERGAN) 12.5 MG tablet Take 12.5 mg by mouth every 6 (six) hours as needed. for nausea   promethazine (PHENERGAN) 25 MG tablet Take 1 tablet (25 mg total) by mouth every 8 (eight) hours as needed for nausea or vomiting.   Semaglutide-Weight Management 0.5 MG/0.5ML SOAJ Inject 0.5 mg into the skin.   tamsulosin (FLOMAX) 0.4 MG CAPS capsule TAKE ONE CAPSULE BY MOUTH DAILY 30 MINUTES AFTER LAST MEAL OF THE DAY   venlafaxine XR (EFFEXOR-XR) 37.5 MG 24 hr capsule TAKE ONE CAPSULE BY MOUTH DAILY FOR 14 DAYS   allopurinol (ZYLOPRIM) 100 MG tablet Take 1 tablet by mouth daily. (Patient not taking: Reported on 07/18/2021)   aspirin EC 81 MG tablet Take 1 tablet (81 mg total) by mouth daily. (Patient not taking: Reported on 07/18/2021)   buPROPion (WELLBUTRIN XL) 150 MG 24 hr tablet Take 1 tablet by mouth daily. (Patient not taking: Reported on 07/18/2021)   diazepam (VALIUM) 5 MG tablet  (Patient not taking: Reported on 07/18/2021)   DULoxetine (CYMBALTA) 30 MG capsule Take 1 capsule by mouth in the morning and at bedtime. (Patient not taking: Reported on 07/18/2021)   enoxaparin (LOVENOX) 40 MG/0.4ML injection Inject into the skin. (Patient not taking: Reported on 07/18/2021)   ezetimibe (  ZETIA) 10 MG tablet Take 1 tablet by mouth daily.   FLUoxetine (PROZAC) 20 MG capsule  (Patient not taking: Reported on 07/18/2021)   HYDROmorphone (DILAUDID) 2 MG tablet  Take 1 tablet (2 mg total) by mouth every 4 (four) hours as needed for severe pain. (Patient not taking: Reported on 07/18/2021)   metoCLOPramide (REGLAN) 10 MG tablet TAKE ONE TABLET BY MOUTH TWICE A DAY TAKE 1 EACH MORNING AND 1 EACH EVENING TO HELP STOMACH EMPTY AND DECREASE  NAUSEA.   metolazone (ZAROXOLYN) 2.5 MG tablet TAKE ONE TABLET BY MOUTH DAILY AS NEEDED FOR WEIGHT UP 5 LBS OR MORE   metoprolol succinate (TOPROL-XL) 100 MG 24 hr tablet Take 1 tablet by mouth daily.   naproxen sodium (ALEVE) 220 MG tablet Take 220 mg by mouth. (Patient not taking: Reported on 07/18/2021)   oxymetazoline (AFRIN) 0.05 % nasal spray Place 1 spray into both nostrils 2 (two) times daily. (Patient not taking: Reported on 07/18/2021)   potassium chloride SA (KLOR-CON M20) 20 MEQ tablet Take 1.5 tablets (30 mEq total) by mouth daily.   sertraline (ZOLOFT) 50 MG tablet Take 1 tablet by mouth daily. (Patient not taking: Reported on 07/18/2021)   spironolactone (ALDACTONE) 25 MG tablet Take 0.5 tablets by mouth daily. (Patient not taking: Reported on 07/18/2021)   No facility-administered encounter medications on file as of 02/22/2022.     Review of Systems  Review of Systems  No chest pain with exertion.  No orthopnea or PND.  Comprehensive review systems otherwise negative. Physical Exam  BP 120/74 (BP Location: Left Arm, Patient Position: Sitting, Cuff Size: Normal)   Pulse 99   Temp 98 F (36.7 C) (Oral)   Ht 6' 2.5" (1.892 m)   Wt (!) 310 lb 3.2 oz (140.7 kg)   SpO2 97%   BMI 39.29 kg/m   Wt Readings from Last 5 Encounters:  02/22/22 (!) 310 lb 3.2 oz (140.7 kg)  07/18/21 (!) 308 lb 10.3 oz (140 kg)  07/06/21 (!) 309 lb (140.2 kg)  03/22/21 (!) 312 lb (141.5 kg)  03/08/21 (!) 303 lb (137.4 kg)    BMI Readings from Last 5 Encounters:  02/22/22 39.29 kg/m  07/18/21 39.63 kg/m  07/06/21 39.14 kg/m  03/22/21 39.52 kg/m  03/08/21 38.90 kg/m     Physical Exam General: Sitting in chair, no  acute distress Eyes: EOMI, no icterus Neck: Supple, no JVP Pulmonary: Clear, normal work of breathing Abdomen: Nondistended, bowel sounds present Cardiovascular: Warm, trace edema to midshin MSK: No synovitis, no joint effusion Neuro: Normal gait, no weakness Psych: Normal mood, full affect   Assessment & Plan:   Dyspnea on exertion: Likely multifactorial and certainly symptomatically worse with increase in fluid retention.  LVEDP elevated in the past.  Likely contribution of cardiac causes.  With atelectasis on recent CT abdomen pelvis, suspect habitus also contributing.  Lastly, given worsening recent seasonal allergies and mild improvement with prednisone therapy over the last few months, suspicion for development of asthma.  Chest x-ray today, PFTs for further evaluation.  ICS/LABA therapy, mid dose Advair discus twice daily prescribed today.  Assess response.  Nocturnal hypoxemia: Despite good adherence to CPAP therapy.  Reportedly AHI well controlled.  He shows me a report recently where her oxygen saturation was 75 to 100% with vast majority of time less than 90%.  Advised gradual increase in the bleeding oxygen from 3 to 4 L for a week and if oxygen saturations continue to be low, less than  88%, increased to 5 L.  Has upcoming appointment with sleep doctor next month.  Suspect will need a CPAP titration study to determine correct amount of oxygen bleeding versus alteration in CPAP pressure.   Return in about 3 months (around 05/24/2022).   Lanier Clam, MD 02/22/2022

## 2022-02-22 NOTE — Patient Instructions (Addendum)
Nice to meet you  For the oxygen dropping at night, I recommend increasing to 4 L of bleeding with your CPAP for a week and if oxygen saturations continue to be below 88% any point during increasing to 5 L.  This will give [place] more data for your sleep doctor for your upcoming appointment.  For your recent shortness of breath, lets try Advair.  Use 1 puff twice a day every day.  Rinse your mouth out with water after every use.  Since the prednisone helped some this may help a little bit as well.  For further evaluation of your symptoms, I recommend pulmonary function test.  This can be scheduled at your convenience at the next available appointment time.  Please send me a message on MyChart with any issues or concerns.  Return to clinic in 3 months or sooner as needed with Dr. Silas Flood

## 2022-02-24 MED ORDER — BUDESONIDE-FORMOTEROL FUMARATE 80-4.5 MCG/ACT IN AERO: 2.0000 | INHALATION_SPRAY | Freq: Two times a day (BID) | RESPIRATORY_TRACT | 12 refills | Status: DC

## 2022-02-24 NOTE — Progress Notes (Signed)
Chest xray is clear

## 2022-03-08 ENCOUNTER — Other Ambulatory Visit (HOSPITAL_COMMUNITY): Payer: Self-pay

## 2022-03-23 ENCOUNTER — Telehealth: Payer: Self-pay | Admitting: Pharmacy Technician

## 2022-03-23 ENCOUNTER — Other Ambulatory Visit (HOSPITAL_COMMUNITY): Payer: Self-pay

## 2022-03-23 NOTE — Telephone Encounter (Signed)
Received Prior Auth request for Symbicort from [facility]. They are requesting patient to try their preferred alternative(s)-Wixela. Justification for use of non-formulary medication is required.    Appropriate reasons are contraindications to the formulary agent, adverse reactions to the formulary agent, therapeutic failure of all formulary alternatives, no formulary alternatives exist, or serious risk is associated with a changed to a formulary agent.   If none of these reasons apply, please send in new rx for [facility]

## 2022-03-23 NOTE — Telephone Encounter (Signed)
I prescribed Advair then symbicort but I do not recall why the switch to symbicort. Ok for [employer] 250 mcg dose BID unless he had side effects to Advair. If so, please let me know.

## 2022-03-23 NOTE — Telephone Encounter (Signed)
MH,  I looked at the chest xray results, and you gave me verbal orders for the Symbicort due to the Advair causing him to have issues with his BP and heart rate.   Do you want me to still sent in [place]?   Please advise sir

## 2022-03-23 NOTE — Telephone Encounter (Signed)
Thank you for reminding me. Patient has had adverse reaction of elevated BP and heart rate with components of Wixela. Can we submit the PA for symbicort?

## 2022-03-27 NOTE — Telephone Encounter (Signed)
Called and spoke to patient and advised him that we submitted all the paperwork for his Symbicort. And I advised him that if he does not received his Symbicort in a timely manner to call the [place] and determine what is wrong. Nothing further needed

## 2022-03-27 NOTE — Telephone Encounter (Signed)
F[facility]ed [facility] back with the information given about Advair, since it's the same medication as Wixela.

## 2022-04-05 ENCOUNTER — Ambulatory Visit (INDEPENDENT_AMBULATORY_CARE_PROVIDER_SITE_OTHER): Payer: No Typology Code available for payment source | Admitting: Pulmonary Disease

## 2022-04-05 DIAGNOSIS — R0609 Other forms of dyspnea: Secondary | ICD-10-CM | POA: Diagnosis not present

## 2022-04-05 LAB — PULMONARY FUNCTION TEST
DL/VA % pred: 116 %
DL/VA: 4.89 ml/min/mmHg/L
DLCO cor % pred: 125 %
DLCO cor: 38.95 ml/min/mmHg
DLCO unc % pred: 125 %
DLCO unc: 38.95 ml/min/mmHg
FEF 25-75 Post: 2.46 L/sec
FEF 25-75 Pre: 2.38 L/sec
FEF2575-%Change-Post: 3 %
FEF2575-%Pred-Post: 72 %
FEF2575-%Pred-Pre: 69 %
FEV1-%Change-Post: 3 %
FEV1-%Pred-Post: 91 %
FEV1-%Pred-Pre: 87 %
FEV1-Post: 3.76 L
FEV1-Pre: 3.62 L
FEV1FVC-%Change-Post: 1 %
FEV1FVC-%Pred-Pre: 88 %
FEV6-%Change-Post: 2 %
FEV6-%Pred-Post: 105 %
FEV6-%Pred-Pre: 102 %
FEV6-Post: 5.5 L
FEV6-Pre: 5.36 L
FEV6FVC-%Change-Post: 0 %
FEV6FVC-%Pred-Post: 103 %
FEV6FVC-%Pred-Pre: 103 %
FVC-%Change-Post: 2 %
FVC-%Pred-Post: 101 %
FVC-%Pred-Pre: 99 %
FVC-Post: 5.52 L
FVC-Pre: 5.4 L
Post FEV1/FVC ratio: 68 %
Post FEV6/FVC ratio: 100 %
Pre FEV1/FVC ratio: 67 %
Pre FEV6/FVC Ratio: 99 %
RV % pred: 113 %
RV: 2.71 L
TLC % pred: 106 %
TLC: 8.17 L

## 2022-04-05 NOTE — Progress Notes (Signed)
PFT done today. 

## 2022-05-08 ENCOUNTER — Encounter: Payer: Self-pay | Admitting: Pulmonary Disease

## 2022-05-08 ENCOUNTER — Ambulatory Visit (INDEPENDENT_AMBULATORY_CARE_PROVIDER_SITE_OTHER): Payer: No Typology Code available for payment source | Admitting: Pulmonary Disease

## 2022-05-08 VITALS — BP 126/70 | HR 86 | Temp 98.4°F | Wt 313.6 lb

## 2022-05-08 DIAGNOSIS — R0609 Other forms of dyspnea: Secondary | ICD-10-CM | POA: Diagnosis not present

## 2022-05-08 DIAGNOSIS — G4730 Sleep apnea, unspecified: Secondary | ICD-10-CM

## 2022-05-08 NOTE — Patient Instructions (Signed)
Nice to see you again  I am glad the Symbicort seem to help some, we can continue this.  There should be plenty of refills, a years worth was sent in September.  If you have another bout of bronchitis, please let me know, next that would be to increase the Symbicort to the higher dose of the inhaled steroid.  We will send messages regarding the bill for the chest x-ray and try to get that resolved or help start the process of getting that resolved.  Return to clinic in 6 months or sooner as needed with Dr. Silas Flood

## 2022-05-08 NOTE — Progress Notes (Signed)
$'@Patient'j$  ID: [name], male    DOB: July 21, 1965, 56 y.o.   MRN: [id number]  Chief Complaint  Patient presents with   Follow-up    Pt is here for follow up for his DOE. Pt states his Symbicort works well for him and helps him breath easier.      Referring provider: [facility]  HPI:   56 y.o. man whom we are seeing in follow up  for evaluation of nocturnal hypoxemia despite OSA treatment with CPAP and oxygen bleed and is well as dyspnea on exertion.    Overall, doing okay.  Was prescribed Symbicort at last visit.  With this he states his dyspnea on exertion is improved.  Household tasks are easier, less taxing.  He did have a prolonged course of bronchitis recently.  Required methylprednisolone.  Steroid Dosepak it sounds like.  That is improved over time.  The steroids really helped.  We discussed his PFTs obtained in the interim.  He show are normal with elevated DLCO which can be a subtle sign of asthma.  Given improvement with Symbicort and steroids in the past, this is likely the correct diagnosis for symptoms.  HPI at initial visit: Notes that over the last several months he has been hypoxemic at night.  He is pulse oximeter he wears at night is connected to his phone app and his CPAP.  It demonstrates low oxygen pretty consistently dropping below 90% every night.  He currently is on CPAP therapy with 3 L oxygen bleed in.  He has been in contact with his sleep doctor.  He has an upcoming appointment next month.  If oxygen continues to be low they plan for CPAP titration.  Also with worsening dyspnea over time.  He states that this pretty clearly relates to times when he has extra swelling or fluid overload.  Notes his breathing is worse.  Breathing worse when lying down or supine.  Otherwise, no times a day with things are better or worse.  No other position to make things better or worse.  No other seasonal or environmental factors he can identify to make things better  or worse.  No other alleviating or exacerbating factors.  He does endorse worsening nasal allergies this spring for several months.  Historically not much of an issue but they were quite bad.  A lot of nasal congestion, postnasal drip.  Some worsening shortness of breath as well.  He was treated with a prednisone course for some disc issues in the spine and this greatly cleared up his allergic symptoms and help with his breathing some.  He reports recent CT abdomen pelvis images demonstrated atelectasis in the bases.  He knows of no abnormalities on chest imaging recently, no ILD, fibrosis etc.  Diagnosis sleep apnea 01/2017 based on review of CPAP titration study.,  Time not similar water via nasal pillow.  Most recent chest imaging, chest x-ray 2018 personally reviewed and interpreted as clear lungs bilaterally.  TTE 2017 demonstrated grade 1 diastolic dysfunction, MVR, dilated RV.  Most recent TTE 02/13/2022 demonstrated normal estimated RVSP, normal RV and RA size, normal RV function.  Left heart catheterization in 2018 with LVEDP of 22.  Right heart cath was not pursued as difficulty cannulating due to stenosis in the veins per report on operative note.Marland Kitchen  PMH: Gout, seasonal allergies, diabetes, diastolic dysfunction Surgical history: Tonsillectomy, wisdom tooth extraction Family history: Mother with hyperlipidemia, angina, father with CAD, hyperlipidemia, hypertension Social history: Former smoker, quit around  year 2000, lives in [place] / Pulmonary Flowsheets:   ACT:      No data to display           MMRC:     No data to display           Epworth:      No data to display           Tests:   FENO:  No results found for: "NITRICOXIDE"  PFT:    Latest Ref Rng & Units 04/05/2022   10:55 AM  PFT Results  FVC-Pre L 5.40   FVC-Predicted Pre % 99   FVC-Post L 5.52   FVC-Predicted Post % 101   Pre FEV1/FVC % % 67   Post FEV1/FCV % % 68    FEV1-Pre L 3.62   FEV1-Predicted Pre % 87   FEV1-Post L 3.76   DLCO uncorrected ml/min/mmHg 38.95   DLCO UNC% % 125   DLCO corrected ml/min/mmHg 38.95   DLCO COR %Predicted % 125   DLVA Predicted % 116   TLC L 8.17   TLC % Predicted % 106   RV % Predicted % 113   Personally reviewed and interpreted as normal spirometry although borderline but not meeting criteria for fix obstruction, no bronchodilator response, lung volumes within normal limits, DLCO within normal limits to elevated DLCO  WALK:      No data to display           Imaging: Personally reviewed and as per EMR and discussion in this note No results found.  Lab Results: Personally reviewed CBC    Component Value Date/Time   WBC 7.6 12/14/2016 1159   RBC 4.95 12/14/2016 1159   HGB 14.7 12/14/2016 1159   HGB 14.1 07/27/2016 0853   HCT 42.5 12/14/2016 1159   HCT 42.9 07/27/2016 0853   PLT 246 12/14/2016 1159   PLT 247 07/27/2016 0853   MCV 85.9 12/14/2016 1159   MCV 87 07/27/2016 0853   MCH 29.7 12/14/2016 1159   MCHC 34.6 12/14/2016 1159   RDW 13.9 12/14/2016 1159   RDW 13.6 07/27/2016 0853   LYMPHSABS 2.0 09/21/2016 1033   LYMPHSABS 1.8 07/27/2016 0853   MONOABS 0.6 09/21/2016 1033   EOSABS 0.1 09/21/2016 1033   EOSABS 0.1 07/27/2016 0853   BASOSABS 0.1 09/21/2016 1033   BASOSABS 0.0 07/27/2016 0853    BMET    Component Value Date/Time   NA 143 04/05/2017 1613   NA 142 11/20/2016 0828   K 3.7 04/05/2017 1613   CL 102 04/05/2017 1613   CO2 26 04/05/2017 1613   GLUCOSE 95 04/05/2017 1613   BUN 18 04/05/2017 1613   BUN 22 11/20/2016 0828   CREATININE 1.27 04/05/2017 1613   CALCIUM 10.2 04/05/2017 1613   GFRNONAA 64 04/05/2017 1613   GFRAA 74 04/05/2017 1613    BNP    Component Value Date/Time   BNP 24.9 04/12/2016 0820    ProBNP No results found for: "PROBNP"  Specialty Problems       Pulmonary Problems   Shortness of breath   Sleep apnea, unspecified    Allergies   Allergen Reactions   Bupropion     Other reaction(s): Nightmares, Suicidal thoughts, Nightmares, Suicidal thoughts   Demerol [Meperidine] Anaphylaxis   Fluoxetine Other (See Comments)    Other reaction(s): Nightmares, Suicidal thoughts   Amitriptyline Other (See Comments)    Other reaction(s): Hallucinations   Oxycodone Rash   Sucralfate Nausea  Only, Other (See Comments) and Nausea And Vomiting    Other reaction(s): Abdominal Pain   Advair Hfa [Fluticasone-Salmeterol] Other (See Comments)    Patients blood pressure increased.    Demerol [Meperidine Hcl]     Other reaction(s): OTHER REACTION   Eplerenone     diarrhea   Metformin And Related     diarrhea   Oxycodone-Acetaminophen     Other reaction(s): HIVES   Sertraline Itching    Other reaction(s): Cough    Immunization History  Administered Date(s) Administered   Influenza,inj,Quad PF,6+ Mos 03/23/2020, 05/16/2021   Moderna Covid-19 Vaccine Bivalent Booster 50yr & up 05/16/2021   Moderna Sars-Covid-2 Vaccination 08/22/2019, 09/19/2019, 06/08/2020   PNEUMOCOCCAL CONJUGATE-20 09/27/2021   Tetanus Immune Globulin 03/06/2015   Zoster Recombinat (Shingrix) 09/27/2021, 01/24/2022    Past Medical History:  Diagnosis Date   Abnormal CT scan, kidney    mass on right kidney, will have biopsy on NOv 3rd   CHF (congestive heart failure) (HCC)    CKD (chronic kidney disease)    Complication of anesthesia    Diabetes mellitus without complication ([place]    Diabetic retinopathy ([facility]    Gastroparesis    History of Doppler ultrasound    Carotid [place]: Stable 1-39% bilateral ICA stenosis; Normal subclavian arteries, bilaterally.   History of echocardiogram    Echo 11/17: EF 60-65, normal wall motion, grade 1 diastolic dysfunction, aortic sclerosis without stenosis, trivial MR, normal RVSF, moderate RAE   History of kidney stones    Hyperlipidemia 02/21/2014   diet controlled, no medications   Left acetabular fracture ([facility]     nonoperative around age 56  Sleep apnea, unspecified 02/17/2021   Stroke ([facility] 02/21/2014   TIA - no limitaions from stroke   Ulcer    Hx - no longer a problem per patient    Tobacco History: Social History   Tobacco Use  Smoking Status Former   Packs/day: 2.00   Types: Cigarettes   Start date: 07/09/1981   Quit date: 07/10/1999   Years since quitting: 22.8  Smokeless Tobacco Never   Counseling given: Not Answered   Continue to not smoke  Outpatient Encounter Medications as of 05/08/2022  Medication Sig   allopurinol (ZYLOPRIM) 100 MG tablet Take 1 tablet by mouth daily.   budesonide-formoterol (SYMBICORT) 80-4.5 MCG/ACT inhaler Inhale 2 puffs into the lungs 2 (two) times daily.   buprenorphine (BUTRANS) 10 MCG/HR PTWK APPLY 1 PATCH TO SKIN EVERY WEEK AT 9AM FOR PAIN [10/19W 11/15W]   buprenorphine (BUTRANS) 20 MCG/HR PTWK APPLY 1 PATCH TO SKIN EVERY WEEK AT 9AM FOR PAIN [10/19W 11/15W]   cetirizine (ZYRTEC) 10 MG tablet Take 1 tablet by mouth 2 (two) times daily.   clopidogrel (PLAVIX) 75 MG tablet Take 1 tablet by mouth daily.   cyanocobalamin (,VITAMIN B-12,) 1000 MCG/ML injection Inject 1,000 mcg into the muscle 1 day or 1 dose.   empagliflozin (JARDIANCE) 25 MG TABS tablet Take 1 tablet by mouth daily.   glucose 4 GM chewable tablet Chew by mouth.   naloxone (NARCAN) 2 MG/2ML injection CALL 911. For suspected opioid overdose, spray 1 mL in each nostril.  Repeat after 3 minutes if no or minimal response.   nitroGLYCERIN (NITROSTAT) 0.4 MG SL tablet Place under the tongue as needed.   oxymetazoline (AFRIN) 0.05 % nasal spray Place 1 spray into both nostrils 2 (two) times daily.   pantoprazole (PROTONIX) 20 MG tablet Take 1 tablet (20 mg total) by mouth 2 (  two) times daily before a meal.   Phenylephrine-APAP-Guaifenesin (TYLENOL SINUS SEVERE PO) Take by mouth.   Semaglutide-Weight Management 0.5 MG/0.5ML SOAJ Inject 2 mg into the skin.   tamsulosin (FLOMAX) 0.4 MG CAPS  capsule TAKE ONE CAPSULE BY MOUTH DAILY 30 MINUTES AFTER LAST MEAL OF THE DAY   [DISCONTINUED] Ascorbic Acid 500 MG CHEW Chew 1 tablet by mouth daily.   [DISCONTINUED] aspirin EC 81 MG tablet Take 1 tablet (81 mg total) by mouth daily.   [DISCONTINUED] Cholecalciferol 50 MCG (2000 UT) CAPS Take by mouth.   [DISCONTINUED] diazepam (VALIUM) 5 MG tablet    [DISCONTINUED] enoxaparin (LOVENOX) 40 MG/0.4ML injection Inject into the skin.   [DISCONTINUED] ferrous sulfate 325 (65 FE) MG tablet TAKE ONE TABLET BY MOUTH EVERY OTHER DAY - TAKE WITH VITAMIN C   [DISCONTINUED] furosemide (LASIX) 20 MG tablet Take 20 mg by mouth.   [DISCONTINUED] furosemide (LASIX) 20 MG tablet Take 20 mg by mouth.   [DISCONTINUED] furosemide (LASIX) 40 MG tablet Take 40 mg by mouth.   [DISCONTINUED] HYDROmorphone (DILAUDID) 2 MG tablet Take 1 tablet (2 mg total) by mouth every 4 (four) hours as needed for severe pain.   [DISCONTINUED] KRILL OIL PO Take by mouth.   [DISCONTINUED] loratadine-pseudoephedrine (CLARITIN-D 12-HOUR) 5-120 MG tablet Take 1 tablet by mouth 2 (two) times daily.   [DISCONTINUED] metformin (FORTAMET) 500 MG (OSM) 24 hr tablet Take 1 tablet by mouth.   [DISCONTINUED] naproxen sodium (ALEVE) 220 MG tablet Take 220 mg by mouth.   [DISCONTINUED] promethazine (PHENERGAN) 12.5 MG tablet Take 12.5 mg by mouth every 6 (six) hours as needed. for nausea   [DISCONTINUED] promethazine (PHENERGAN) 25 MG tablet Take 1 tablet (25 mg total) by mouth every 8 (eight) hours as needed for nausea or vomiting.   [DISCONTINUED] sertraline (ZOLOFT) 50 MG tablet Take 1 tablet by mouth daily.   [DISCONTINUED] venlafaxine XR (EFFEXOR-XR) 37.5 MG 24 hr capsule TAKE ONE CAPSULE BY MOUTH DAILY FOR 14 DAYS   ezetimibe (ZETIA) 10 MG tablet Take 1 tablet by mouth daily.   metolazone (ZAROXOLYN) 2.5 MG tablet TAKE ONE TABLET BY MOUTH DAILY AS NEEDED FOR WEIGHT UP 5 LBS OR MORE   metoprolol succinate (TOPROL-XL) 100 MG 24 hr tablet Take  1 tablet by mouth daily.   potassium chloride SA (KLOR-CON M20) 20 MEQ tablet Take 1.5 tablets (30 mEq total) by mouth daily.   [DISCONTINUED] buPROPion (WELLBUTRIN XL) 150 MG 24 hr tablet Take 1 tablet by mouth daily. (Patient not taking: Reported on 07/18/2021)   [DISCONTINUED] DULoxetine (CYMBALTA) 30 MG capsule Take 1 capsule by mouth in the morning and at bedtime. (Patient not taking: Reported on 07/18/2021)   [DISCONTINUED] FLUoxetine (PROZAC) 20 MG capsule  (Patient not taking: Reported on 07/18/2021)   [DISCONTINUED] fluticasone-salmeterol (ADVAIR DISKUS) 250-50 MCG/ACT AEPB Inhale 1 puff into the lungs in the morning and at bedtime.   [DISCONTINUED] metoCLOPramide (REGLAN) 10 MG tablet TAKE ONE TABLET BY MOUTH TWICE A DAY TAKE 1 EACH MORNING AND 1 EACH EVENING TO HELP STOMACH EMPTY AND DECREASE  NAUSEA.   [DISCONTINUED] spironolactone (ALDACTONE) 25 MG tablet Take 0.5 tablets by mouth daily. (Patient not taking: Reported on 07/18/2021)   No facility-administered encounter medications on file as of 05/08/2022.     Review of Systems  Review of Systems  N/a Physical Exam  BP 126/70 (BP Location: Left Arm, Patient Position: Sitting, Cuff Size: Normal)   Pulse 86   Temp 98.4 F (36.9 C) (Oral)  Wt (!) 313 lb 9.6 oz (142.2 kg)   SpO2 97%   BMI 39.73 kg/m   Wt Readings from Last 5 Encounters:  05/08/22 (!) 313 lb 9.6 oz (142.2 kg)  02/22/22 (!) 310 lb 3.2 oz (140.7 kg)  07/18/21 (!) 308 lb 10.3 oz (140 kg)  07/06/21 (!) 309 lb (140.2 kg)  03/22/21 (!) 312 lb (141.5 kg)    BMI Readings from Last 5 Encounters:  05/08/22 39.73 kg/m  02/22/22 39.29 kg/m  07/18/21 39.63 kg/m  07/06/21 39.14 kg/m  03/22/21 39.52 kg/m     Physical Exam General: Sitting in chair, no acute distress Eyes: EOMI, no icterus Neck: Supple, no JVP Pulmonary: Clear, normal work of breathing Abdomen: Nondistended, bowel sounds present Cardiovascular: Warm, trace edema  MSK: No synovitis, no  joint effusion Neuro: Normal gait, no weakness Psych: Normal mood, full affect   Assessment & Plan:   Dyspnea on exertion: Likely multifactorial and certainly symptomatically worse with increase in fluid retention.  LVEDP elevated in the past.  Likely contribution of cardiac causes.  With atelectasis on recent CT abdomen pelvis, suspect habitus also contributing.  Lastly, given worsening recent seasonal allergies and mild improvement with prednisone therapy over the last few months, suspicion for development of asthma.  Chest x-ray today 02/2022 clear.  PFTs 03/2022 largely within normal limits.  Symptom improved with mid dose Symbicort.  To continue..  Nocturnal hypoxemia: Despite good adherence to CPAP therapy.  Reportedly AHI well controlled.  He shows me a report recently where her oxygen saturation was 75 to 100% with vast majority of time less than 90%.  Advised gradual increase in the bleeding oxygen from 3 to 4 L for a week and if oxygen saturations continue to be low, less than 88%, increased to 5 L.  He reports a repeat sleep study-hypoxemia resolved with BiPAP.  Ongoing work-up for sleep doctor but hopefully this is going to help with this issue, get it resolved.   Return in about 6 months (around 11/07/2022).   Lanier Clam, MD 05/08/2022

## 2022-12-18 ENCOUNTER — Telehealth: Payer: Self-pay | Admitting: Pulmonary Disease

## 2022-12-18 NOTE — Telephone Encounter (Signed)
FYI, patient will continue care within [facility], no pulmonary authorization approved for our office.

## 2023-05-16 ENCOUNTER — Other Ambulatory Visit: Payer: Self-pay | Admitting: Urology

## 2023-05-28 NOTE — Progress Notes (Addendum)
Anesthesia Review:  [facility] [place]  Cardiologist : [facility] [place] -  LOV 04/29/23- under Media dated 06/04/23  CHF MD  DR Pearlie Oyster dated 05/29/23- clearance with Plavix instructions - In Media tab dated 06/04/23  Card- [facility] [place]  [facility]uro- [place] in [place]  Chest x-ray : 2v-04/07/22- media tab dated 06/04/23.  PFt- 04/05/22  [facility] has Cardiology clearance and will fax over.  REquested on 06/04/23.     Have requested with Medical Release all info from [place] on 06/04/23.     EKG : 06/04/23  and 03/21/23( Media tab dated 06/04/23)  Echo : 2017  and 03/21/23- under Media dated 06/04/23  Stress test: 2018  Cardiac Cath :  2018  Activity level: can do a flight of stairs without difficutly  Sleep Study/ CPAP : has IVAP with 3L of oxygen at nite  Fasting Blood Sugar :      / Checks Blood Sugar -- times a day:   Blood Thinner/ Instructions /Last Dose: ASA / Instructions/ Last Dose :    Plavix- stop 5 days prior per pt last dose on 06/04/24 per pt   DM- type 2- checks gluocse 1-3x daily  Hgba1c-  06/04/2023 -7.6 routed to Dr Laverle Patter on 06/05/23.  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   PT has Spinal Cord Stinulator.  - PT to bring remote DOS>   PT has Butrans patch he places every Monday.  PT will not place on 06/11/2023 but will bring with him in case he can have on during surgery or place after surgery.     PT had nasal congestion with cough that started on 05/31/2023.  Was not seen by MD Negative for Covid per home test per pt .  AT time of preop on 06/04/2023 pt reproits no symptoms.

## 2023-06-01 NOTE — Patient Instructions (Signed)
SURGICAL WAITING ROOM VISITATION  Patients having surgery or a procedure may have no more than 2 support people in the waiting area - these visitors may rotate.    Children under the age of 56 must have an adult with them who is not the patient.  Due to an increase in RSV and influenza rates and associated hospitalizations, children ages 63 and under may not visit patients in [facility] hospitals.  If the patient needs to stay at the hospital during part of their recovery, the visitor guidelines for inpatient rooms apply. Pre-op nurse will coordinate an appropriate time for 1 support person to accompany patient in pre-op.  This support person may not rotate.    Please refer to the [facility] website for the visitor guidelines for Inpatients (after your surgery is over and you are in a regular room).       Your procedure is scheduled on:  06/10/2022    Report to [facility] Main Entrance    Report to admitting at   0915 AM   Call this number if you have problems the morning of surgery [phone]   Do not eat food  or drink liquids :After Midnight.                   If you have questions, please contact your surgeon's office.      Oral Hygiene is also important to reduce your risk of infection.                                    Remember - BRUSH YOUR TEETH THE MORNING OF SURGERY WITH YOUR REGULAR TOOTHPASTE  DENTURES WILL BE REMOVED PRIOR TO SURGERY PLEASE DO NOT APPLY "Poly grip" OR ADHESIVES!!!   Do NOT smoke after Midnight   Stop all vitamins and herbal supplements 7 days before surgery.   Take these medicines the morning of surgery with A SIP OF WATER:  Inhalers as usual and bring, flonase, claritin toprol, protonix             Januvia- none am of surgery           Jardiance- hold for 72 hours prior to surgery .  Last dose on 06/07/23            Amaryl- none am of surgery   DO NOT TAKE ANY ORAL DIABETIC MEDICATIONS DAY OF YOUR SURGERY  Bring CPAP mask and  tubing day of surgery.                              You may not have any metal on your body including hair pins, jewelry, and body piercing             Do not wear make-up, lotions, powders, perfumes/cologne, or deodorant  Do not wear nail polish including gel and S&S, artificial/acrylic nails, or any other type of covering on natural nails including finger and toenails. If you have artificial nails, gel coating, etc. that needs to be removed by a nail salon please have this removed prior to surgery or surgery may need to be canceled/ delayed if the surgeon/ anesthesia feels like they are unable to be safely monitored.   Do not shave  48 hours prior to surgery.               Men may shave  face and neck.   Do not bring valuables to the hospital. [facility] IS NOT             RESPONSIBLE   FOR VALUABLES.   Contacts, glasses, dentures or bridgework may not be worn into surgery.   Bring small overnight bag day of surgery.   DO NOT BRING YOUR HOME MEDICATIONS TO THE HOSPITAL. PHARMACY WILL DISPENSE MEDICATIONS LISTED ON YOUR MEDICATION LIST TO YOU DURING YOUR ADMISSION IN THE HOSPITAL!    Patients discharged on the day of surgery will not be allowed to drive home.  Someone NEEDS to stay with you for the first 24 hours after anesthesia.   Special Instructions: Bring a copy of your healthcare power of attorney and living will documents the day of surgery if you haven't scanned them before.              Please read over the following fact sheets you were given: IF YOU HAVE QUESTIONS ABOUT YOUR PRE-OP INSTRUCTIONS PLEASE CALL [phone]   If you received a COVID test during your pre-op visit  it is requested that you wear a mask when out in public, stay away from anyone that may not be feeling well and notify your surgeon if you develop symptoms. If you test positive for Covid or have been in contact with anyone that has tested positive in the last 10 days please notify you surgeon.    [facility] - Preparing for Surgery Before surgery, you can play an important role.  Because skin is not sterile, your skin needs to be as free of germs as possible.  You can reduce the number of germs on your skin by washing with CHG (chlorahexidine gluconate) soap before surgery.  CHG is an antiseptic cleaner which kills germs and bonds with the skin to continue killing germs even after washing. Please DO NOT use if you have an allergy to CHG or antibacterial soaps.  If your skin becomes reddened/irritated stop using the CHG and inform your nurse when you arrive at Short Stay. Do not shave (including legs and underarms) for at least 48 hours prior to the first CHG shower.  You may shave your face/neck. Please follow these instructions carefully:  1.  Shower with CHG Soap the night before surgery and the  morning of Surgery.  2.  If you choose to wash your hair, wash your hair first as usual with your  normal  shampoo.  3.  After you shampoo, rinse your hair and body thoroughly to remove the  shampoo.                           4.  Use CHG as you would any other liquid soap.  You can apply chg directly  to the skin and wash                       Gently with a scrungie or clean washcloth.  5.  Apply the CHG Soap to your body ONLY FROM THE NECK DOWN.   Do not use on face/ open                           Wound or open sores. Avoid contact with eyes, ears mouth and genitals (private parts).  Wash face,  Genitals (private parts) with your normal soap.             6.  Wash thoroughly, paying special attention to the area where your surgery  will be performed.  7.  Thoroughly rinse your body with warm water from the neck down.  8.  DO NOT shower/wash with your normal soap after using and rinsing off  the CHG Soap.                9.  Pat yourself dry with a clean towel.            10.  Wear clean pajamas.            11.  Place clean sheets on your bed the night of your first shower and do not   sleep with pets. Day of Surgery : Do not apply any lotions/deodorants the morning of surgery.  Please wear clean clothes to the hospital/surgery center.  FAILURE TO FOLLOW THESE INSTRUCTIONS MAY RESULT IN THE CANCELLATION OF YOUR SURGERY PATIENT SIGNATURE_________________________________  NURSE SIGNATURE__________________________________  ________________________________________________________________________

## 2023-06-04 ENCOUNTER — Other Ambulatory Visit: Payer: Self-pay

## 2023-06-04 ENCOUNTER — Encounter (HOSPITAL_COMMUNITY)
Admission: RE | Admit: 2023-06-04 | Discharge: 2023-06-04 | Disposition: A | Discharge status: Home / Self Care | Payer: No Typology Code available for payment source | Source: Ambulatory Visit | Attending: Urology | Admitting: Urology

## 2023-06-04 ENCOUNTER — Encounter (HOSPITAL_COMMUNITY): Payer: Self-pay

## 2023-06-04 VITALS — BP 138/88 | HR 84 | Temp 98.2°F | Resp 16 | Ht 72.0 in | Wt 319.0 lb

## 2023-06-04 DIAGNOSIS — Z7984 Long term (current) use of oral hypoglycemic drugs: Secondary | ICD-10-CM | POA: Diagnosis not present

## 2023-06-04 DIAGNOSIS — G4736 Sleep related hypoventilation in conditions classified elsewhere: Secondary | ICD-10-CM | POA: Diagnosis not present

## 2023-06-04 DIAGNOSIS — F419 Anxiety disorder, unspecified: Secondary | ICD-10-CM | POA: Insufficient documentation

## 2023-06-04 DIAGNOSIS — N189 Chronic kidney disease, unspecified: Secondary | ICD-10-CM | POA: Diagnosis not present

## 2023-06-04 DIAGNOSIS — I440 Atrioventricular block, first degree: Secondary | ICD-10-CM | POA: Diagnosis not present

## 2023-06-04 DIAGNOSIS — Z6841 Body Mass Index (BMI) 40.0 and over, adult: Secondary | ICD-10-CM | POA: Diagnosis not present

## 2023-06-04 DIAGNOSIS — G4733 Obstructive sleep apnea (adult) (pediatric): Secondary | ICD-10-CM | POA: Diagnosis not present

## 2023-06-04 DIAGNOSIS — K3184 Gastroparesis: Secondary | ICD-10-CM | POA: Diagnosis not present

## 2023-06-04 DIAGNOSIS — F32A Depression, unspecified: Secondary | ICD-10-CM | POA: Diagnosis not present

## 2023-06-04 DIAGNOSIS — I5030 Unspecified diastolic (congestive) heart failure: Secondary | ICD-10-CM | POA: Diagnosis not present

## 2023-06-04 DIAGNOSIS — K219 Gastro-esophageal reflux disease without esophagitis: Secondary | ICD-10-CM | POA: Insufficient documentation

## 2023-06-04 DIAGNOSIS — E1122 Type 2 diabetes mellitus with diabetic chronic kidney disease: Secondary | ICD-10-CM | POA: Diagnosis not present

## 2023-06-04 DIAGNOSIS — I130 Hypertensive heart and chronic kidney disease with heart failure and stage 1 through stage 4 chronic kidney disease, or unspecified chronic kidney disease: Secondary | ICD-10-CM | POA: Diagnosis not present

## 2023-06-04 DIAGNOSIS — Z7902 Long term (current) use of antithrombotics/antiplatelets: Secondary | ICD-10-CM | POA: Insufficient documentation

## 2023-06-04 DIAGNOSIS — G8929 Other chronic pain: Secondary | ICD-10-CM | POA: Insufficient documentation

## 2023-06-04 DIAGNOSIS — Z79899 Other long term (current) drug therapy: Secondary | ICD-10-CM | POA: Insufficient documentation

## 2023-06-04 DIAGNOSIS — Z01818 Encounter for other preprocedural examination: Secondary | ICD-10-CM | POA: Insufficient documentation

## 2023-06-04 DIAGNOSIS — Z8673 Personal history of transient ischemic attack (TIA), and cerebral infarction without residual deficits: Secondary | ICD-10-CM | POA: Diagnosis not present

## 2023-06-04 DIAGNOSIS — N2889 Other specified disorders of kidney and ureter: Secondary | ICD-10-CM | POA: Insufficient documentation

## 2023-06-04 DIAGNOSIS — Z87891 Personal history of nicotine dependence: Secondary | ICD-10-CM | POA: Diagnosis not present

## 2023-06-04 DIAGNOSIS — Z9682 Presence of neurostimulator: Secondary | ICD-10-CM | POA: Diagnosis not present

## 2023-06-04 DIAGNOSIS — E669 Obesity, unspecified: Secondary | ICD-10-CM | POA: Diagnosis not present

## 2023-06-04 HISTORY — DX: Gastro-esophageal reflux disease without esophagitis: K21.9

## 2023-06-04 HISTORY — DX: Angina pectoris, unspecified: I20.9

## 2023-06-04 HISTORY — DX: Depression, unspecified: F32.A

## 2023-06-04 HISTORY — DX: Cardiac murmur, unspecified: R01.1

## 2023-06-04 HISTORY — DX: Unspecified osteoarthritis, unspecified site: M19.90

## 2023-06-04 HISTORY — DX: Anxiety disorder, unspecified: F41.9

## 2023-06-04 HISTORY — DX: Dyspnea, unspecified: R06.00

## 2023-06-04 HISTORY — DX: Pneumonia, unspecified organism: J18.9

## 2023-06-04 LAB — CBC
HCT: 43.3 % (ref 39.0–52.0)
Hemoglobin: 13.7 g/dL (ref 13.0–17.0)
MCH: 27.6 pg (ref 26.0–34.0)
MCHC: 31.6 g/dL (ref 30.0–36.0)
MCV: 87.3 fL (ref 80.0–100.0)
Platelets: 210 10*3/uL (ref 150–400)
RBC: 4.96 MIL/uL (ref 4.22–5.81)
RDW: 13.4 % (ref 11.5–15.5)
WBC: 4.7 10*3/uL (ref 4.0–10.5)
nRBC: 0 % (ref 0.0–0.2)

## 2023-06-04 LAB — BASIC METABOLIC PANEL
Anion gap: 8 (ref 5–15)
BUN: 25 mg/dL — ABNORMAL HIGH (ref 6–20)
CO2: 24 mmol/L (ref 22–32)
Calcium: 8.8 mg/dL — ABNORMAL LOW (ref 8.9–10.3)
Chloride: 104 mmol/L (ref 98–111)
Creatinine, Ser: 1.13 mg/dL (ref 0.61–1.24)
GFR, Estimated: >60 mL/min (ref >60)
Glucose, Bld: 160 mg/dL — ABNORMAL HIGH (ref 70–99)
Potassium: 3.4 mmol/L — ABNORMAL LOW (ref 3.5–5.1)
Sodium: 136 mmol/L (ref 135–145)

## 2023-06-04 LAB — GLUCOSE, CAPILLARY: Glucose-Capillary: 138 mg/dL — ABNORMAL HIGH (ref 70–99)

## 2023-06-05 LAB — HEMOGLOBIN A1C
Hgb A1c MFr Bld: 7.6 % — ABNORMAL HIGH (ref 4.8–5.6)
Mean Plasma Glucose: 171 mg/dL

## 2023-06-07 NOTE — Anesthesia Preprocedure Evaluation (Addendum)
 Anesthesia Evaluation  Patient identified by MRN, date of birth, ID band Patient awake    Reviewed: Allergy & Precautions, NPO status , Patient's Chart, lab work & pertinent test results  History of Anesthesia Complications Negative for: history of anesthetic complications  Airway Mallampati: II  TM Distance: >3 FB Neck ROM: Full    Dental  (+) Missing, Poor Dentition,    Pulmonary sleep apnea, Continuous Positive Airway Pressure Ventilation and Oxygen sleep apnea , neg COPD, Patient abstained from smoking.Not current smoker, former smoker   Pulmonary exam normal breath sounds clear to auscultation       Cardiovascular Exercise Tolerance: Good METShypertension, +CHF  (-) CAD and (-) Past MI Normal cardiovascular exam(-) dysrhythmias  Rhythm:Regular Rate:Normal - Systolic murmurs    Neuro/Psych  PSYCHIATRIC DISORDERS Anxiety Depression    Chronic pain TIA (on Plavix)   GI/Hepatic ,GERD  ,,(+)     (-) substance abuse    Endo/Other  diabetes  Class 3 obesityObesity   Renal/GU Renal disease (CKD)RIGHT RENAL NEOPLASM     Musculoskeletal  (+) Arthritis ,    Abdominal  (+) + obese  Peds  Hematology negative hematology ROS (+)   Anesthesia Other Findings   Reproductive/Obstetrics                             Anesthesia Physical Anesthesia Plan  ASA: 3  Anesthesia Plan: General   Post-op Pain Management: Tylenol  PO (pre-op)*   Induction: Intravenous  PONV Risk Score and Plan: 2 and Treatment may vary due to age or medical condition, Ondansetron  and Dexamethasone   Airway Management Planned: Natural Airway and Nasal CPAP  Additional Equipment: None  Intra-op Plan:   Post-operative Plan:   Informed Consent: I have reviewed the patients History and Physical, chart, labs and discussed the procedure including the risks, benefits and alternatives for the proposed anesthesia with the  patient or authorized representative who has indicated his/her understanding and acceptance.     Dental advisory given  Plan Discussed with: CRNA  Anesthesia Plan Comments: (See PAT note from 12/30 by K Gekas PA-C   [name] is a 58 yo male who presents to PAT prior to XI RIGHT ROBOTIC ASSISTED LAPAROSCOPIC PARTIAL NEPHRECTOMY on 06/11/23 with Dr. Renda for a right kidney mass. PMH of former smoking (quit 2001), HTN, diastolic CHF, OSA (uses CPAP), nocturnal hypoxemia, hx of TIA, GERD, gastroparesis, DM, anxiety, depression, chronic pain with Butrans  patch use, obesity (BMI 43).   Patient is followed by PCP at the [facility]. Last seen on 04/26/23. OSA and hypoxemia controlled on CPAP. Has chronic pain and follows with pain management. Uses Butrans  patch and has previously received ketamine  injections. Has spinal stimulator. Diabetes is not at goal (A1c 7.6). Patient stopped Ozempic due to GI issues.   Patient follows with Cardiology for CHF. Last seen on 03/26/23. Patient reported fatigue, SOB. Echo in 03/2023 showed Stage 1 DD, normal RVSP, normal RV function. Fatigue was thought to be due to volume status. Takes torsemide .   Patient follows with Neurology at the [facility]. He is on Plavix for a TIA in 2015. This will be held for 5 days prior.   Patient follows with Pulmonology at the [facility]. Last seen 03/26/23 )        Anesthesia Quick Evaluation

## 2023-06-08 NOTE — H&P (Signed)
 Office Visit Report     06/05/2023   --------------------------------------------------------------------------------   [name]  MRN: [id number]  DOB: [birth year], 58 year old Male  SSN: [id number]   PRIMARY CARE:  Vito T. Beverley, MD  PRIMARY CARE FAX:  [phone]  REFERRING:  Gaile Cerise, MD  PROVIDER:  Donnice Brooks, M.D.  TREATING:  Waddell Sharps, P.A.  LOCATION:  [facility] [phone]     --------------------------------------------------------------------------------   CC/HPI: Right renal neoplasm   [name] is a 58 year old gentleman who is seen today at the request of Dr. Gaile Ada off. He has a longstanding history of loin pain-hematuria syndrome. He also has an unusual history that when he was 31, he apparently was told that he had kidney cancer on his left kidney. This appeared to be localized and not metastatic. He describes having undergone 6 months of chemotherapy which was quite difficult and then never received any other therapy and never had any surgical therapy on his left kidney. He apparently has had no recurrences. He did have a head injury around this time and so has very vague memories and understanding of what exactly transpired.   More recently, he had undergone an updated evaluation for his hematuria by Dr. Curlie. This included cystoscopy and upper tract imaging. Cystoscopy was unremarkable. In 2022, imaging had demonstrated a 1.6 cm anterior, interpolar enhancing right renal mass. This was reimaged on 02/14/2023 and indicated interval growth up to 2 cm. He did have a CT scan of the chest as he has a known pulmonary nodule that measures 4 mm. This was unchanged and no other concerning findings to suggest metastatic disease was noted. His abdominal CT scan also demonstrated no evidence of renal vein involvement, regional lymph node involvement, or abdominal metastatic disease.    He does have multiple medical comorbidities including  history of diabetes. He apparently had a prior heart attack that was noted retrospectively. He apparently has diastolic heart failure and is followed by Dr. Donzell Capra at the [facility]. He apparently had an echocardiogram earlier this year with preserved left ventricular function. He also has a history of a TIA that was diagnosed in 2017. He is followed by Dr. Armida who is his neurologist. He is treated chronically with antiplatelet therapy with Plavix.   06/05/23: [name] is here today for pre-operative history and physical exam in anticipation of right robot assisted laporascopic partial nephrectomy for removal of a right renal neoplasm on 06/11/23 with Dr. Renda. He is doing well and is without complaint today. Pt denies F/C, HA, CP, SOB, N/V, diarrhea/constipation, back pain, flank pain, hematuria, and dysuria.     ALLERGIES: Amitriptyline - hallucinations buPROPion - suicidal thoughts Demerol - Anaphylaxis fluoxetine - suicidal thoughts Percocet - Skin Rash    MEDICATIONS: Metoprolol  Succinate 50 mg tablet, extended release 24 hr  Plavix 75 mg tablet  Tamsulosin  Hcl 0.4 mg capsule  Buprenorphine  10 mcg/hour patch, transdermal weekly 1 PO Daily  Empacelif  Ezetimibe  10 mg tablet 1 tablet PO Daily  Fluticasone  Propionate  Glimepiride 2 mg tablet 1 tablet PO Daily  Nitroglycerin  0.4 mg tablet, sublingual  Pantoprazole  Sodium tablet  Potassium Chloride  20 meq tablet, extended release  Promethazine  Hcl 25 mg tablet  Torsemide  20 mg tablet 1 tablet PO Daily  Vitamin B12     GU PSH: No GU PSH      PSH Notes: rt and left meniscus tear repair, cauterization of ulcer in 2001, cardiac cath x  2 No stents placed  Upper and lower endoscopies   NON-GU PSH: Insert/place Heart Cath - 2018, 2001 Stimulate Spinal Cord - 2020 Tonsillectomy     GU PMH: Right renal neoplasm - 04/24/2023 Other microscopic hematuria - 2018 Renal calculus - 2018 Renal cyst - 2018 Renal cell  carcinoma, left      PMH Notes:  1898-06-05 00:00:00 - Note: Normal Routine History And Physical Adult, stomach ulcers  Heart attack   NON-GU PMH: Anxiety Arthritis Congestive heart failure Depression Diabetes Type 2 GERD Hypercholesterolemia Hypertension Other heart failure Sleep Apnea Stroke/TIA    FAMILY HISTORY: Death - Father Hematuria - Grandfather ischemic heart disease - Father Kidney Failure - Grandfather, Brother Kidney Stones - Grandfather, Brother   SOCIAL HISTORY: Marital Status: Single Preferred Language: English; Race: White Current Smoking Status: Patient does not smoke anymore. Has not smoked since 04/05/2001. Smoked for 17 years. Smoked 2 packs per day.   Tobacco Use Assessment Completed: Used Tobacco in last 30 days? Has never drank.  Drinks 4+ caffeinated drinks per day. Patient's occupation International Aid/development Worker.    REVIEW OF SYSTEMS:    GU Review Male:   Patient denies penile pain, trouble starting your stream, frequent urination, get up at night to urinate, stream starts and stops, have to strain to urinate , burning/ pain with urination, leakage of urine, hard to postpone urination, and erection problems.  Gastrointestinal (Upper):   Patient denies nausea, vomiting, and indigestion/ heartburn.  Gastrointestinal (Lower):   Patient denies diarrhea and constipation.  Constitutional:   Patient denies fever, night sweats, weight loss, and fatigue.  Skin:   Patient denies skin rash/ lesion and itching.  Eyes:   Patient denies blurred vision and double vision.  Ears/ Nose/ Throat:   Patient denies sore throat and sinus problems.  Hematologic/Lymphatic:   Patient denies swollen glands and easy bruising.  Cardiovascular:   Patient denies leg swelling and chest pains.  Respiratory:   Patient denies cough and shortness of breath.  Endocrine:   Patient denies excessive thirst.  Musculoskeletal:   Patient denies back pain and joint pain.  Neurological:    Patient denies headaches and dizziness.  Psychologic:   Patient denies depression and anxiety.   VITAL SIGNS:      06/05/2023 08:58 AM  Weight 329.5 lb / 149.46 kg  Height 74 in / 187.96 cm  BP 109/70 mmHg  Pulse 73 /min  BMI 42.3 kg/m   MULTI-SYSTEM PHYSICAL EXAMINATION:    Constitutional: Well-nourished. No physical deformities. Normally developed. Good grooming.  Neck: Neck symmetrical, not swollen. Normal tracheal position.  Respiratory: No labored breathing, no use of accessory muscles.   Cardiovascular: Normal temperature, normal extremity pulses, no swelling, no varicosities.  Lymphatic: No enlargement of neck, axillae, groin.  Skin: No paleness, no jaundice, no cyanosis. No lesion, no ulcer, no rash.  Neurologic / Psychiatric: Oriented to time, oriented to place, oriented to person. No depression, no anxiety, no agitation.  Gastrointestinal: No mass, no tenderness, no rigidity, non obese abdomen.  Musculoskeletal: Normal gait and station of head and neck.     Complexity of Data:  Source Of History:  Patient, Medical Record Summary  Records Review:   Previous Doctor Records, Previous Patient Records  Urine Test Review:   Urinalysis  X-Ray Review: C.T. Abdomen/Pelvis: Reviewed Report.     06/05/23  Urinalysis  Urine Appearance Clear   Urine Color Yellow   Urine Glucose 2+ mg/dL  Urine Bilirubin Neg mg/dL  Urine Ketones  Neg mg/dL  Urine Specific Gravity 1.015   Urine Blood Neg ery/uL  Urine pH 5.5   Urine Protein Neg mg/dL  Urine Urobilinogen 0.2 mg/dL  Urine Nitrites Neg   Urine Leukocyte Esterase Neg leu/uL   PROCEDURES:          Visit Complexity - G2211          Urinalysis Dipstick Dipstick Cont'd  Color: Yellow Bilirubin: Neg mg/dL  Appearance: Clear Ketones: Neg mg/dL  Specific Gravity: 8.984 Blood: Neg ery/uL  pH: 5.5 Protein: Neg mg/dL  Glucose: 2+ mg/dL Urobilinogen: 0.2 mg/dL    Nitrites: Neg    Leukocyte Esterase: Neg leu/uL     ASSESSMENT:      ICD-10 Details  1 GU:   Right renal neoplasm - D49.511 Chronic, Threat to Bodily Function   PLAN:           Orders Labs Urine Culture          Schedule Return Visit/Planned Activity: Keep Scheduled Appointment          Document Letter(s):  Created for Patient: Clinical Summary         Notes:   UA today normal. Preop culture sent. He is doing well today. All questions were answered to the best of my ability. He is scheduled to undergo right robot assisted laporascopic partial nephrectomy on 06/11/23 with Dr. Renda.         Next Appointment:      Next Appointment: 06/11/2023 11:15 AM    Appointment Type: Surgery     Location: [facility] [phone]    Provider: Gretel Renda, M.D.    Reason for Visit: WL/OBS (R) RA LAP PARTIAL NEPHRECTOMY WITH AMANDA      * Signed by Waddell Sharps, P.A. on 06/05/23 at 3:33 PM (EST)*

## 2023-06-11 ENCOUNTER — Ambulatory Visit (HOSPITAL_COMMUNITY): Payer: No Typology Code available for payment source | Admitting: Medical

## 2023-06-11 ENCOUNTER — Encounter (HOSPITAL_COMMUNITY): Payer: Self-pay | Admitting: Urology

## 2023-06-11 ENCOUNTER — Inpatient Hospital Stay (HOSPITAL_COMMUNITY)
Admission: RE | Admit: 2023-06-11 | Discharge: 2023-06-13 | DRG: 657 | Disposition: A | Discharge status: Home / Self Care | Payer: No Typology Code available for payment source | Attending: Urology | Admitting: Urology

## 2023-06-11 ENCOUNTER — Encounter (HOSPITAL_COMMUNITY)
Admission: RE | Disposition: A | Discharge status: Home / Self Care | Payer: Self-pay | Source: Home / Self Care | Attending: Urology

## 2023-06-11 ENCOUNTER — Other Ambulatory Visit: Payer: Self-pay

## 2023-06-11 ENCOUNTER — Ambulatory Visit (HOSPITAL_COMMUNITY): Payer: No Typology Code available for payment source

## 2023-06-11 DIAGNOSIS — Z87891 Personal history of nicotine dependence: Secondary | ICD-10-CM | POA: Diagnosis not present

## 2023-06-11 DIAGNOSIS — Z888 Allergy status to other drugs, medicaments and biological substances status: Secondary | ICD-10-CM

## 2023-06-11 DIAGNOSIS — Z01818 Encounter for other preprocedural examination: Secondary | ICD-10-CM

## 2023-06-11 DIAGNOSIS — I110 Hypertensive heart disease with heart failure: Secondary | ICD-10-CM

## 2023-06-11 DIAGNOSIS — C641 Malignant neoplasm of right kidney, except renal pelvis: Secondary | ICD-10-CM | POA: Diagnosis present

## 2023-06-11 DIAGNOSIS — Z885 Allergy status to narcotic agent status: Secondary | ICD-10-CM

## 2023-06-11 DIAGNOSIS — D49511 Neoplasm of unspecified behavior of right kidney: Principal | ICD-10-CM | POA: Diagnosis present

## 2023-06-11 DIAGNOSIS — Z841 Family history of disorders of kidney and ureter: Secondary | ICD-10-CM

## 2023-06-11 DIAGNOSIS — Z8673 Personal history of transient ischemic attack (TIA), and cerebral infarction without residual deficits: Secondary | ICD-10-CM

## 2023-06-11 DIAGNOSIS — I252 Old myocardial infarction: Secondary | ICD-10-CM

## 2023-06-11 DIAGNOSIS — Z8249 Family history of ischemic heart disease and other diseases of the circulatory system: Secondary | ICD-10-CM

## 2023-06-11 DIAGNOSIS — E119 Type 2 diabetes mellitus without complications: Secondary | ICD-10-CM | POA: Diagnosis present

## 2023-06-11 DIAGNOSIS — K219 Gastro-esophageal reflux disease without esophagitis: Secondary | ICD-10-CM | POA: Diagnosis present

## 2023-06-11 DIAGNOSIS — I5032 Chronic diastolic (congestive) heart failure: Secondary | ICD-10-CM | POA: Diagnosis not present

## 2023-06-11 HISTORY — PX: ROBOTIC ASSITED PARTIAL NEPHRECTOMY: SHX6087

## 2023-06-11 LAB — BASIC METABOLIC PANEL
Anion gap: 12 (ref 5–15)
BUN: 20 mg/dL (ref 6–20)
CO2: 22 mmol/L (ref 22–32)
Calcium: 9.3 mg/dL (ref 8.9–10.3)
Chloride: 102 mmol/L (ref 98–111)
Creatinine, Ser: 1.24 mg/dL (ref 0.61–1.24)
GFR, Estimated: >60 mL/min (ref >60)
Glucose, Bld: 192 mg/dL — ABNORMAL HIGH (ref 70–99)
Potassium: 3.5 mmol/L (ref 3.5–5.1)
Sodium: 136 mmol/L (ref 135–145)

## 2023-06-11 LAB — GLUCOSE, CAPILLARY
Glucose-Capillary: 152 mg/dL — ABNORMAL HIGH (ref 70–99)
Glucose-Capillary: 159 mg/dL — ABNORMAL HIGH (ref 70–99)
Glucose-Capillary: 170 mg/dL — ABNORMAL HIGH (ref 70–99)
Glucose-Capillary: 195 mg/dL — ABNORMAL HIGH (ref 70–99)
Glucose-Capillary: 198 mg/dL — ABNORMAL HIGH (ref 70–99)

## 2023-06-11 LAB — TYPE AND SCREEN
ABO/RH(D): A POS
Antibody Screen: NEGATIVE

## 2023-06-11 LAB — HEMOGLOBIN AND HEMATOCRIT, BLOOD
HCT: 44.9 % (ref 39.0–52.0)
Hemoglobin: 14.9 g/dL (ref 13.0–17.0)

## 2023-06-11 SURGERY — NEPHRECTOMY, PARTIAL, ROBOT-ASSISTED
Anesthesia: General | Laterality: Right

## 2023-06-11 MED ORDER — FENTANYL CITRATE (PF) 100 MCG/2ML IJ SOLN
INTRAMUSCULAR | Status: AC
  Filled 2023-06-11: qty 2

## 2023-06-11 MED ORDER — HEMOSTATIC AGENTS (NO CHARGE) OPTIME
TOPICAL | Status: DC | PRN
  Administered 2023-06-11: 1 via TOPICAL

## 2023-06-11 MED ORDER — EZETIMIBE 10 MG PO TABS
10.0000 mg | ORAL_TABLET | Freq: Every day | ORAL | Status: DC
  Administered 2023-06-12: 10 mg via ORAL
  Filled 2023-06-11: qty 1

## 2023-06-11 MED ORDER — OXYCODONE HCL 5 MG PO TABS: 5.0000 mg | ORAL_TABLET | Freq: Once | ORAL | Status: DC | PRN

## 2023-06-11 MED ORDER — ONDANSETRON HCL 4 MG/2ML IJ SOLN
4.0000 mg | INTRAMUSCULAR | Status: DC | PRN
  Administered 2023-06-12 (×2): 4 mg via INTRAVENOUS
  Filled 2023-06-11 (×2): qty 2

## 2023-06-11 MED ORDER — OXYCODONE HCL 5 MG/5ML PO SOLN: 5.0000 mg | Freq: Once | ORAL | Status: DC | PRN

## 2023-06-11 MED ORDER — ROCURONIUM BROMIDE 10 MG/ML (PF) SYRINGE
PREFILLED_SYRINGE | INTRAVENOUS | Status: AC
  Filled 2023-06-11: qty 10

## 2023-06-11 MED ORDER — FENTANYL CITRATE PF 50 MCG/ML IJ SOSY
25.0000 ug | PREFILLED_SYRINGE | INTRAMUSCULAR | Status: DC | PRN
  Administered 2023-06-11 (×3): 50 ug via INTRAVENOUS

## 2023-06-11 MED ORDER — INSULIN ASPART 100 UNIT/ML IJ SOLN
0.0000 [IU] | INTRAMUSCULAR | Status: AC | PRN
  Administered 2023-06-11: 4 [IU] via SUBCUTANEOUS
  Administered 2023-06-11: 2 [IU] via SUBCUTANEOUS
  Filled 2023-06-11: qty 1

## 2023-06-11 MED ORDER — CHLORHEXIDINE GLUCONATE 0.12 % MT SOLN
15.0000 mL | Freq: Once | OROMUCOSAL | Status: AC
Start: 2023-06-11 — End: 2023-06-11
  Administered 2023-06-11: 15 mL via OROMUCOSAL

## 2023-06-11 MED ORDER — DOCUSATE SODIUM 100 MG PO CAPS: 100.0000 mg | ORAL_CAPSULE | Freq: Two times a day (BID) | ORAL | Status: AC

## 2023-06-11 MED ORDER — PANTOPRAZOLE SODIUM 20 MG PO TBEC
20.0000 mg | DELAYED_RELEASE_TABLET | Freq: Two times a day (BID) | ORAL | Status: DC
  Administered 2023-06-11 – 2023-06-12 (×3): 20 mg via ORAL
  Filled 2023-06-11 (×4): qty 1

## 2023-06-11 MED ORDER — FENTANYL CITRATE PF 50 MCG/ML IJ SOSY
PREFILLED_SYRINGE | INTRAMUSCULAR | Status: AC
  Filled 2023-06-11: qty 1

## 2023-06-11 MED ORDER — STERILE WATER FOR IRRIGATION IR SOLN
Status: DC | PRN
  Administered 2023-06-11: 1000 mL

## 2023-06-11 MED ORDER — BUPRENORPHINE 20 MCG/HR TD PTWK
2.0000 | MEDICATED_PATCH | TRANSDERMAL | Status: DC
  Administered 2023-06-12: 2 via TRANSDERMAL

## 2023-06-11 MED ORDER — MIDAZOLAM HCL 2 MG/2ML IJ SOLN
INTRAMUSCULAR | Status: AC
Start: 2023-06-11 — End: ?
  Filled 2023-06-11: qty 2

## 2023-06-11 MED ORDER — MORPHINE SULFATE (PF) 2 MG/ML IV SOLN
2.0000 mg | INTRAVENOUS | Status: DC | PRN
  Administered 2023-06-11: 2 mg via INTRAVENOUS
  Administered 2023-06-12 (×2): 4 mg via INTRAVENOUS
  Filled 2023-06-11 (×2): qty 2
  Filled 2023-06-11: qty 1

## 2023-06-11 MED ORDER — INSULIN ASPART 100 UNIT/ML IJ SOLN
0.0000 [IU] | INTRAMUSCULAR | Status: DC
  Administered 2023-06-11 (×2): 3 [IU] via SUBCUTANEOUS
  Administered 2023-06-12 (×2): 2 [IU] via SUBCUTANEOUS
  Administered 2023-06-13: 5 [IU] via SUBCUTANEOUS

## 2023-06-11 MED ORDER — LACTATED RINGERS IV SOLN: INTRAVENOUS | Status: DC | PRN

## 2023-06-11 MED ORDER — MOMETASONE FURO-FORMOTEROL FUM 100-5 MCG/ACT IN AERO: 2.0000 | INHALATION_SPRAY | Freq: Two times a day (BID) | RESPIRATORY_TRACT | Status: DC

## 2023-06-11 MED ORDER — TAMSULOSIN HCL 0.4 MG PO CAPS
0.4000 mg | ORAL_CAPSULE | Freq: Every day | ORAL | Status: DC
  Administered 2023-06-11 – 2023-06-12 (×2): 0.4 mg via ORAL
  Filled 2023-06-11 (×2): qty 1

## 2023-06-11 MED ORDER — SODIUM CHLORIDE (PF) 0.9 % IJ SOLN
INTRAMUSCULAR | Status: DC | PRN
  Administered 2023-06-11: 20 mL

## 2023-06-11 MED ORDER — SODIUM CHLORIDE 0.9 % IV SOLN
3.0000 g | INTRAVENOUS | Status: AC
  Administered 2023-06-11: 3 g via INTRAVENOUS
  Filled 2023-06-11: qty 3

## 2023-06-11 MED ORDER — NITROGLYCERIN 0.4 MG SL SUBL: 0.4000 mg | SUBLINGUAL_TABLET | SUBLINGUAL | Status: DC | PRN

## 2023-06-11 MED ORDER — SUGAMMADEX SODIUM 200 MG/2ML IV SOLN
INTRAVENOUS | Status: DC | PRN
  Administered 2023-06-11: 400 mg via INTRAVENOUS

## 2023-06-11 MED ORDER — METOPROLOL SUCCINATE ER 25 MG PO TB24
25.0000 mg | ORAL_TABLET | Freq: Every day | ORAL | Status: DC
  Administered 2023-06-11 – 2023-06-12 (×2): 25 mg via ORAL
  Filled 2023-06-11 (×2): qty 1

## 2023-06-11 MED ORDER — KETAMINE HCL 10 MG/ML IJ SOLN
INTRAMUSCULAR | Status: DC | PRN
  Administered 2023-06-11: 20 mg via INTRAVENOUS
  Administered 2023-06-11: 30 mg via INTRAVENOUS

## 2023-06-11 MED ORDER — DROPERIDOL 2.5 MG/ML IJ SOLN
0.6250 mg | Freq: Once | INTRAMUSCULAR | Status: AC | PRN
  Administered 2023-06-11: 0.625 mg via INTRAVENOUS

## 2023-06-11 MED ORDER — BUPRENORPHINE HCL 0.3 MG/ML IJ SOLN
0.3000 mg | Freq: Once | INTRAMUSCULAR | Status: AC
  Administered 2023-06-11 (×2): .15 mg via INTRAVENOUS
  Filled 2023-06-11: qty 1

## 2023-06-11 MED ORDER — ORAL CARE MOUTH RINSE: 15.0000 mL | Freq: Once | OROMUCOSAL | Status: AC

## 2023-06-11 MED ORDER — DOCUSATE SODIUM 100 MG PO CAPS
100.0000 mg | ORAL_CAPSULE | Freq: Two times a day (BID) | ORAL | Status: DC
Start: 2023-06-11 — End: 2023-06-13
  Administered 2023-06-11 – 2023-06-12 (×3): 100 mg via ORAL
  Filled 2023-06-11 (×3): qty 1

## 2023-06-11 MED ORDER — KETAMINE HCL 50 MG/5ML IJ SOSY
PREFILLED_SYRINGE | INTRAMUSCULAR | Status: AC
  Filled 2023-06-11: qty 5

## 2023-06-11 MED ORDER — SULFAMETHOXAZOLE-TRIMETHOPRIM 800-160 MG PO TABS: 1.0000 | ORAL_TABLET | Freq: Two times a day (BID) | ORAL | 0 refills | Status: AC

## 2023-06-11 MED ORDER — ONDANSETRON HCL 4 MG/2ML IJ SOLN
INTRAMUSCULAR | Status: DC | PRN
  Administered 2023-06-11: 4 mg via INTRAVENOUS

## 2023-06-11 MED ORDER — BUPIVACAINE LIPOSOME 1.3 % IJ SUSP
INTRAMUSCULAR | Status: DC | PRN
  Administered 2023-06-11: 20 mL

## 2023-06-11 MED ORDER — FENTANYL CITRATE (PF) 250 MCG/5ML IJ SOLN
INTRAMUSCULAR | Status: AC
  Filled 2023-06-11: qty 5

## 2023-06-11 MED ORDER — TORSEMIDE 20 MG PO TABS
80.0000 mg | ORAL_TABLET | Freq: Every day | ORAL | Status: DC
  Administered 2023-06-12: 80 mg via ORAL
  Filled 2023-06-11 (×2): qty 4

## 2023-06-11 MED ORDER — LORATADINE 10 MG PO TABS
10.0000 mg | ORAL_TABLET | Freq: Every day | ORAL | Status: DC
  Administered 2023-06-12: 10 mg via ORAL
  Filled 2023-06-11: qty 1

## 2023-06-11 MED ORDER — DEXAMETHASONE SODIUM PHOSPHATE 4 MG/ML IJ SOLN
INTRAMUSCULAR | Status: DC | PRN
  Administered 2023-06-11: 10 mg via INTRAVENOUS

## 2023-06-11 MED ORDER — SODIUM CHLORIDE 0.45 % IV SOLN: INTRAVENOUS | Status: DC

## 2023-06-11 MED ORDER — ROCURONIUM BROMIDE 10 MG/ML (PF) SYRINGE
PREFILLED_SYRINGE | INTRAVENOUS | Status: AC
Start: 2023-06-11 — End: ?
  Filled 2023-06-11: qty 10

## 2023-06-11 MED ORDER — PROPOFOL 10 MG/ML IV BOLUS
INTRAVENOUS | Status: DC | PRN
  Administered 2023-06-11: 200 mg via INTRAVENOUS

## 2023-06-11 MED ORDER — BUPRENORPHINE HCL 0.3 MG/ML IJ SOLN
0.1500 mg | Freq: Once | INTRAMUSCULAR | Status: AC
  Administered 2023-06-11: 0.15 mg via INTRAVENOUS
  Filled 2023-06-11: qty 1

## 2023-06-11 MED ORDER — LACTATED RINGERS IV SOLN: INTRAVENOUS | Status: DC

## 2023-06-11 MED ORDER — FENTANYL CITRATE (PF) 100 MCG/2ML IJ SOLN
INTRAMUSCULAR | Status: DC | PRN
  Administered 2023-06-11: 50 ug via INTRAVENOUS
  Administered 2023-06-11: 100 ug via INTRAVENOUS
  Administered 2023-06-11 (×4): 50 ug via INTRAVENOUS

## 2023-06-11 MED ORDER — LIDOCAINE 2% (20 MG/ML) 5 ML SYRINGE
INTRAMUSCULAR | Status: DC | PRN
  Administered 2023-06-11: 100 mg via INTRAVENOUS

## 2023-06-11 MED ORDER — SODIUM CHLORIDE (PF) 0.9 % IJ SOLN
INTRAMUSCULAR | Status: AC
Start: 2023-06-11 — End: ?
  Filled 2023-06-11: qty 20

## 2023-06-11 MED ORDER — MIDAZOLAM HCL 5 MG/5ML IJ SOLN
INTRAMUSCULAR | Status: DC | PRN
  Administered 2023-06-11: 2 mg via INTRAVENOUS

## 2023-06-11 MED ORDER — PROPOFOL 10 MG/ML IV BOLUS
INTRAVENOUS | Status: AC
  Filled 2023-06-11: qty 20

## 2023-06-11 MED ORDER — DROPERIDOL 2.5 MG/ML IJ SOLN
INTRAMUSCULAR | Status: AC
  Filled 2023-06-11: qty 2

## 2023-06-11 MED ORDER — ROCURONIUM BROMIDE 10 MG/ML (PF) SYRINGE
PREFILLED_SYRINGE | INTRAVENOUS | Status: DC | PRN
  Administered 2023-06-11: 30 mg via INTRAVENOUS
  Administered 2023-06-11: 40 mg via INTRAVENOUS
  Administered 2023-06-11 (×2): 20 mg via INTRAVENOUS
  Administered 2023-06-11: 100 mg via INTRAVENOUS

## 2023-06-11 MED ORDER — CEFAZOLIN SODIUM-DEXTROSE 1-4 GM/50ML-% IV SOLN
1.0000 g | Freq: Three times a day (TID) | INTRAVENOUS | Status: AC
  Administered 2023-06-11 – 2023-06-12 (×2): 1 g via INTRAVENOUS
  Filled 2023-06-11 (×2): qty 50

## 2023-06-11 MED ORDER — FLUTICASONE PROPIONATE 50 MCG/ACT NA SUSP
2.0000 | Freq: Every day | NASAL | Status: DC
  Administered 2023-06-12: 2 via NASAL
  Filled 2023-06-11: qty 16

## 2023-06-11 MED ORDER — DIPHENHYDRAMINE HCL 50 MG/ML IJ SOLN
12.5000 mg | Freq: Four times a day (QID) | INTRAMUSCULAR | Status: DC | PRN
Start: 2023-06-11 — End: 2023-06-13

## 2023-06-11 MED ORDER — BUPIVACAINE LIPOSOME 1.3 % IJ SUSP
INTRAMUSCULAR | Status: AC
Start: 2023-06-11 — End: ?
  Filled 2023-06-11: qty 20

## 2023-06-11 MED ORDER — DIPHENHYDRAMINE HCL 12.5 MG/5ML PO ELIX: 12.5000 mg | ORAL_SOLUTION | Freq: Four times a day (QID) | ORAL | Status: DC | PRN

## 2023-06-11 MED ORDER — ACETAMINOPHEN 10 MG/ML IV SOLN
1000.0000 mg | Freq: Four times a day (QID) | INTRAVENOUS | Status: DC
Start: 2023-06-12 — End: 2023-06-12
  Administered 2023-06-11 – 2023-06-12 (×3): 1000 mg via INTRAVENOUS
  Filled 2023-06-11 (×3): qty 100

## 2023-06-11 SURGICAL SUPPLY — 58 items
APPLICATOR SURGIFLO ENDO (HEMOSTASIS) IMPLANT
BAG COUNTER SPONGE SURGICOUNT (BAG) IMPLANT
CHLORAPREP W/TINT 26 (MISCELLANEOUS) ×1 IMPLANT
CLIP LIGATING HEM O LOK PURPLE (MISCELLANEOUS) ×1 IMPLANT
CLIP LIGATING HEMO O LOK GREEN (MISCELLANEOUS) ×2 IMPLANT
COVER SURGICAL LIGHT HANDLE (MISCELLANEOUS) ×1 IMPLANT
COVER TIP SHEARS 8 DVNC (MISCELLANEOUS) ×1 IMPLANT
DERMABOND ADVANCED .7 DNX12 (GAUZE/BANDAGES/DRESSINGS) ×1 IMPLANT
DRAIN CHANNEL 15F RND FF 3/16 (WOUND CARE) ×1 IMPLANT
DRAPE ARM DVNC X/XI (DISPOSABLE) ×4 IMPLANT
DRAPE COLUMN DVNC XI (DISPOSABLE) ×1 IMPLANT
DRAPE INCISE IOBAN 66X45 STRL (DRAPES) ×1 IMPLANT
DRAPE SHEET LG 3/4 BI-LAMINATE (DRAPES) ×1 IMPLANT
DRIVER NDL LRG 8 DVNC XI (INSTRUMENTS) ×2 IMPLANT
DRIVER NDLE LRG 8 DVNC XI (INSTRUMENTS) ×2
DRSG TEGADERM 4X4.5 CHG (GAUZE/BANDAGES/DRESSINGS) IMPLANT
ELECT PENCIL ROCKER SW 15FT (MISCELLANEOUS) ×1 IMPLANT
ELECT REM PT RETURN 15FT ADLT (MISCELLANEOUS) ×1 IMPLANT
EVACUATOR SILICONE 100CC (DRAIN) ×1 IMPLANT
FORCEPS BPLR 8 MD DVNC XI (FORCEP) ×1 IMPLANT
FORCEPS PROGRASP DVNC XI (FORCEP) ×1 IMPLANT
GAUZE 4X4 16PLY ~~LOC~~+RFID DBL (SPONGE) ×1 IMPLANT
GAUZE SPONGE 2X2 8PLY STRL LF (GAUZE/BANDAGES/DRESSINGS) IMPLANT
GLOVE BIO SURGEON STRL SZ 6.5 (GLOVE) ×1 IMPLANT
GLOVE SURG LX STRL 7.5 STRW (GLOVE) ×2 IMPLANT
GOWN SRG XL LVL 4 BRTHBL STRL (GOWNS) ×1 IMPLANT
GOWN STRL REUS W/ TWL XL LVL3 (GOWN DISPOSABLE) ×2 IMPLANT
HEMOSTAT SURGICEL 4X8 (HEMOSTASIS) IMPLANT
HOLDER FOLEY CATH W/STRAP (MISCELLANEOUS) ×1 IMPLANT
IRRIG SUCT STRYKERFLOW 2 WTIP (MISCELLANEOUS) ×1
IRRIGATION SUCT STRKRFLW 2 WTP (MISCELLANEOUS) ×1 IMPLANT
KIT BASIN OR (CUSTOM PROCEDURE TRAY) ×1 IMPLANT
KIT TURNOVER KIT A (KITS) IMPLANT
PROTECTOR NERVE ULNAR (MISCELLANEOUS) ×2 IMPLANT
SCISSORS LAP 5X45 EPIX DISP (ENDOMECHANICALS) IMPLANT
SCISSORS MNPLR CVD DVNC XI (INSTRUMENTS) ×1 IMPLANT
SEAL UNIV 5-12 XI (MISCELLANEOUS) ×4 IMPLANT
SET TUBE SMOKE EVAC HIGH FLOW (TUBING) ×1 IMPLANT
SOL ELECTROSURG ANTI STICK (MISCELLANEOUS) ×1
SOLUTION ELECTROSURG ANTI STCK (MISCELLANEOUS) ×1 IMPLANT
SPIKE FLUID TRANSFER (MISCELLANEOUS) ×1 IMPLANT
SURGIFLO W/THROMBIN 8M KIT (HEMOSTASIS) ×1 IMPLANT
SUT ETHILON 3 0 PS 1 (SUTURE) ×1 IMPLANT
SUT MNCRL AB 4-0 PS2 18 (SUTURE) ×2 IMPLANT
SUT PDS PLUS AB 0 CT-2 (SUTURE) ×2 IMPLANT
SUT V-LOC BARB 180 2/0GR6 GS22 (SUTURE) ×1
SUT VIC AB 0 CT1 27XBRD ANTBC (SUTURE) ×1 IMPLANT
SUT VIC AB 2-0 SH 27X BRD (SUTURE) IMPLANT
SUT VLOC BARB 180 ABS3/0GR12 (SUTURE) ×1
SUTURE V-LC BRB 180 2/0GR6GS22 (SUTURE) ×1 IMPLANT
SUTURE VLOC BRB 180 ABS3/0GR12 (SUTURE) ×1 IMPLANT
SYS BAG RETRIEVAL 10MM (BASKET) ×1
SYSTEM BAG RETRIEVAL 10MM (BASKET) ×1 IMPLANT
TOWEL OR 17X26 10 PK STRL BLUE (TOWEL DISPOSABLE) ×1 IMPLANT
TRAY FOLEY MTR SLVR 16FR STAT (SET/KITS/TRAYS/PACK) ×1 IMPLANT
TRAY LAPAROSCOPIC (CUSTOM PROCEDURE TRAY) ×1 IMPLANT
TROCAR Z THREAD OPTICAL 12X100 (TROCAR) ×1 IMPLANT
WATER STERILE IRR 1000ML POUR (IV SOLUTION) ×1 IMPLANT

## 2023-06-11 NOTE — Anesthesia Postprocedure Evaluation (Signed)
 Anesthesia Post Note  Patient: [name]  Procedure(s) Performed: XI RIGHT ROBOTIC ASSISTED LAPAROSCOPIC PARTIAL NEPHRECTOMY (Right)     Patient location during evaluation: PACU Anesthesia Type: General Level of consciousness: awake and alert Pain management: pain level controlled Vital Signs Assessment: post-procedure vital signs reviewed and stable Respiratory status: spontaneous breathing, nonlabored ventilation, respiratory function stable and patient connected to nasal cannula oxygen Cardiovascular status: blood pressure returned to baseline and stable Postop Assessment: no apparent nausea or vomiting Anesthetic complications: no   No notable events documented.  Last Vitals:  Vitals:   06/11/23 1500 06/11/23 1515  BP: (!) 156/90 (!) 139/97  Pulse: 90 87  Resp: 20 17  Temp:    SpO2: 94% 96%    Last Pain:  Vitals:   06/11/23 1530  TempSrc:   PainSc: 7                  [name]

## 2023-06-11 NOTE — Discharge Summary (Addendum)
 Date of admission: 06/11/2023  Date of discharge: 06/13/2023  Admission diagnosis: Right renal mass  Discharge diagnosis: same  Secondary diagnoses:  Patient Active Problem List   Diagnosis Date Noted   Renal malignant neoplasm, right (HCC) 06/12/2023   Neoplasm of right kidney 06/11/2023   Major depressive disorder, recurrent, moderate (HCC) 07/06/2021   Depression 03/14/2021   Heart failure (HCC) 02/17/2021   Heart failure (HCC) 02/17/2021   History of adenomatous polyp of colon 02/17/2021   Degeneration of intervertebral disc 02/17/2021   Dental caries on pit and fissure surface penetrating into dentin 02/17/2021   Dysphagia, unspecified 02/17/2021   Generalized hyperhidrosis 02/17/2021   Gross hematuria 02/17/2021   Hypokalemia 02/17/2021   Kidney stone 02/17/2021   Major depressive disorder, recurrent, unspecified (HCC) 02/17/2021   Major depressive disorder, recurrent severe without psychotic features (HCC) 02/17/2021   Melena 02/17/2021   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 02/17/2021   Myositis 02/17/2021   Nail dystrophy 02/17/2021   Obesity, unspecified 02/17/2021   Obesity 02/17/2021   Encounter for screening for eye and ear disorders 02/17/2021   Other specified disorders of urinary system 02/17/2021   Polyp of colon 02/17/2021   Shortness of breath 02/17/2021   Sleep apnea, unspecified 02/17/2021   Suicidal ideations 02/17/2021   Syncope and collapse 02/17/2021   GERD (gastroesophageal reflux disease) 03/06/2018   History of transient ischemic attack (TIA) 03/06/2018   Chronic bilateral low back pain without sciatica 07/25/2017   Chronic pain syndrome 07/25/2017   Upper GI bleed    Acute GI bleeding 08/06/2016   Exertional chest pain 08/01/2016   Chronic diastolic CHF (congestive heart failure) (HCC) 05/03/2016   Essential hypertension 05/03/2016   Flank pain 11/18/2015   HLD (hyperlipidemia) 02/21/2014    Hyperglycemia 02/21/2014   TIA (transient ischemic attack) 02/19/2014    Procedures performed: Procedure(s): XI RIGHT ROBOTIC ASSISTED LAPAROSCOPIC PARTIAL NEPHRECTOMY  History and Physical: For full details, please see admission history and physical. Briefly, [name] is a 58 y.o. year old patient with h/o diabetes, diastolic heart failure, prior MI, chronic pain on buprenorphine  patch, and renal mass now s/p right partial nephrectomy.   Hospital Course: Patient tolerated the procedure well.  He was then transferred to the floor after an uneventful PACU stay.  His hospital course was uncomplicated. On POD1 patient stayed for pain control and nausea. He passed his trial of void.  On POD#2 he had met discharge criteria: was eating a regular diet, was up and ambulating independently,  pain was well controlled, was voiding without a catheter, and was ready to for discharge.  Patient will follow up on 1/28. He will restart his home plavix on Friday 1/10.   Laboratory values:  Recent Labs    06/11/23 1523 06/12/23 0503  HGB 14.9 13.5  HCT 44.9 40.8   Recent Labs    06/12/23 0503 06/12/23 1421 06/13/23 0445  NA 132* 136 130*  K 3.1* 3.0* 2.9*  CL 99 99 95*  CO2 23 27 25   GLUCOSE 117* 141* 239*  BUN 20 19 22*  CREATININE 1.17 1.35* 1.39*  CALCIUM  8.8* 9.1 8.3*   No results for input(s): LABPT, INR in the last 72 hours. No results for input(s): LABURIN in the last 72 hours. Results for orders placed or performed in visit on 11/18/15  Culture, Group A Strep     Status: None   Collection Time: 11/18/15 11:05 AM   Specimen: Throat  Result Value Ref Range Status   Organism ID, Bacteria Normal Upper Respiratory Flora  Final   Organism ID, Bacteria No Beta Hemolytic Streptococci Isolated  Final    Physical Exam  Gen: NAD Resp: Satting well on RA Card: Regular rate Abd: Soft, appropriately tender, ND, incision clean dry and intact GU: Voing spontaneously  Neuro: Alert    Disposition: Home  Discharge instruction: The patient was instructed to be ambulatory but told to refrain from heavy lifting, strenuous activity, or driving.   Discharge medications:  Allergies as of 06/13/2023       Reactions   Bupropion    Other reaction(s): Nightmares, Suicidal thoughts, Nightmares, Suicidal thoughts   Demerol [meperidine] Anaphylaxis   Fluoxetine Other (See Comments)   Other reaction(s): Nightmares, Suicidal thoughts   Oxycodone -acetaminophen  Hives   Other reaction(s): HIVES   Amitriptyline Other (See Comments)   Other reaction(s): Hallucinations   Oxycodone  Rash   Spironolactone    Other Reaction(s): Gynecomastia, Other   Sucralfate  Nausea Only, Other (See Comments), Nausea And Vomiting   Other reaction(s): Abdominal Pain   Advair Hfa [fluticasone -salmeterol] Other (See Comments)   Patients blood pressure increased.    Atorvastatin     Other Reaction(s): Myositis   Demerol [meperidine Hcl]    Other reaction(s): OTHER REACTION   Eplerenone    diarrhea   Metformin And Related    diarrhea   Rosuvastatin    Other Reaction(s): Myositis   Sertraline Itching   Other reaction(s): Cough        Medication List     STOP taking these medications    clopidogrel 75 MG tablet Commonly known as: PLAVIX   cyanocobalamin 1000 MCG/ML injection Commonly known as: VITAMIN B12   Vitamin D-3 125 MCG (5000 UT) Tabs       TAKE these medications    budesonide -formoterol  80-4.5 MCG/ACT inhaler Commonly known as: SYMBICORT  Inhale 2 puffs into the lungs daily as needed (shortness of breath).   buprenorphine  20 MCG/HR Ptwk Commonly known as: BUTRANS  Place 2 patches onto the skin once a week.   diphenhydrAMINE  25 MG tablet Commonly known as: BENADRYL  Take 75 mg by mouth at bedtime.   docusate sodium  100 MG capsule Commonly known as: COLACE Take 1 capsule (100 mg total) by mouth 2 (two) times daily.   empagliflozin 25 MG Tabs tablet Commonly known as:  JARDIANCE Take 12.5 mg by mouth daily.   ezetimibe  10 MG tablet Commonly known as: ZETIA  Take 10 mg by mouth daily.   fluticasone  50 MCG/ACT nasal spray Commonly known as: FLONASE  Place 2 sprays into both nostrils daily.   glimepiride 2 MG tablet Commonly known as: AMARYL Take 2-4 mg by mouth See admin instructions. Take 4 mg in the morning and 2 mg in the evening   glucose 4 GM chewable tablet Chew 1 tablet by mouth once as needed for low blood sugar.   loratadine  10 MG tablet Commonly known as: CLARITIN  Take 10 mg by mouth daily.   metoprolol  succinate 50 MG 24 hr tablet Commonly known as: TOPROL -XL Take 25 mg by mouth daily.   naloxone 2 MG/2ML injection Commonly known as: NARCAN Inject 2 mg into the vein as needed (OR).   nitroGLYCERIN  0.4 MG SL tablet Commonly known as: NITROSTAT  Place 0.4 mg under the tongue every 5 (five) minutes as needed for chest pain.   pantoprazole  20 MG tablet Commonly known as: PROTONIX  Take 1 tablet (20 mg total) by mouth 2 (two) times daily before a meal.  potassium chloride  SA 20 MEQ tablet Commonly known as: Klor-Con  M20 Take 1.5 tablets (30 mEq total) by mouth daily. What changed:  how much to take when to take this additional instructions   sitaGLIPtin 50 MG tablet Commonly known as: JANUVIA Take 50 mg by mouth daily.   sulfamethoxazole -trimethoprim  800-160 MG tablet Commonly known as: BACTRIM  DS Take 1 tablet by mouth 2 (two) times daily. Start the day prior to foley removal appointment   tamsulosin  0.4 MG Caps capsule Commonly known as: FLOMAX  Take 0.4 mg by mouth at bedtime.   torsemide  20 MG tablet Commonly known as: DEMADEX  Take 80 mg by mouth daily.        Followup:   Follow-up Information     Renda Glance, MD Follow up on 07/03/2023.   Specialty: Urology Why: at 9:15 Contact information: [address] [place] [address] [phone]

## 2023-06-11 NOTE — Discharge Instructions (Signed)

## 2023-06-11 NOTE — Anesthesia Procedure Notes (Signed)
 Procedure Name: Intubation Date/Time: 06/11/2023 11:18 AM  Performed by: Judythe Tanda Aran, CRNAPre-anesthesia Checklist: Emergency Drugs available, Suction available, Patient identified and Patient being monitored Patient Re-evaluated:Patient Re-evaluated prior to induction Oxygen Delivery Method: Circle system utilized Preoxygenation: Pre-oxygenation with 100% oxygen Induction Type: IV induction Ventilation: Mask ventilation without difficulty Laryngoscope Size: Glidescope and 4 Grade View: Grade I Tube type: Oral Tube size: 7.5 mm Number of attempts: 1 Airway Equipment and Method: Video-laryngoscopy Placement Confirmation: ETT inserted through vocal cords under direct vision, positive ETCO2 and breath sounds checked- equal and bilateral Secured at: 21 cm Tube secured with: Tape Dental Injury: Teeth and Oropharynx as per pre-operative assessment

## 2023-06-11 NOTE — Transfer of Care (Signed)
 Immediate Anesthesia Transfer of Care Note  Patient: [name]  Procedure(s) Performed: XI RIGHT ROBOTIC ASSISTED LAPAROSCOPIC PARTIAL NEPHRECTOMY (Right)  Patient Location: PACU  Anesthesia Type:General  Level of Consciousness: awake  Airway & Oxygen Therapy: Patient Spontanous Breathing and Patient connected to face mask  Post-op Assessment: Report given to RN and Post -op Vital signs reviewed and stable  Post vital signs: Reviewed and stable  Last Vitals:  Vitals Value Taken Time  BP 147/99 06/11/23 1458  Temp    Pulse 90 06/11/23 1459  Resp 20 06/11/23 1459  SpO2 94 % 06/11/23 1459  Vitals shown include unfiled device data.  Last Pain:  Vitals:   06/11/23 1458  TempSrc:   PainSc: Asleep         Complications: No notable events documented.

## 2023-06-11 NOTE — Progress Notes (Signed)
   06/11/23 2247  BiPAP/CPAP/SIPAP  BiPAP/CPAP/SIPAP Pt Type Adult  BiPAP/CPAP/SIPAP Resmed  Mask Type Full face mask  Respiratory Rate 16 breaths/min  Flow Rate 3 lpm (PER HOME REGIMEN)  Heater Temperature  (humidifier filled with sterile water )  Patient Home Equipment Yes (no frays on cord noted)  Safety Check Completed by RT for Home Unit Yes, no issues noted  BiPAP/CPAP /SiPAP Vitals  Pulse Rate 80  Resp 16  SpO2 94 %

## 2023-06-11 NOTE — Op Note (Signed)
 Preoperative diagnosis: Right renal neoplasm  Postoperative diagnosis: Right renal neoplasm  Procedure:  Right robotic-assisted laparoscopic partial nephrectomy Intraoperative renal ultrasonography  Surgeon: Scott CANDIE Renda Mickey. M.D.  Assistant(s): Alan Hammonds, PA-C  An assistant was required for this surgical procedure.  The duties of the assistant included but were not limited to suctioning, passing suture, camera manipulation, retraction. This procedure would not be able to be performed without an geophysicist/field seismologist.  Resident: Dr. Martina Holding  Anesthesia: General  Complications: None  EBL: 150 mL  IVF:  1000 mL crystalloid  Specimens: Right renal neoplasm  Disposition of specimens: Pathology  Intraoperative findings:       1. Warm renal ischemia time: 14 minutes       2. Intraoperative renal ultrasound findings: There was a 2 cm heterogenous mass identified in the interpolar kidney laterally.  Drains: # 15 Blake perinephric drain  Indication:  [name] is a 58 y.o. year old patient with a right renal mass.  After a thorough review of the management options for their renal mass, they elected to proceed with surgical treatment and the above procedure.  We have discussed the potential benefits and risks of the procedure, side effects of the proposed treatment, the likelihood of the patient achieving the goals of the procedure, and any potential problems that might occur during the procedure or recuperation. Informed consent has been obtained.   Description of procedure:  The patient was taken to the operating room and a general anesthetic was administered. The patient was given preoperative antibiotics, placed in the right modified flank position with care to pad all potential pressure points, and prepped and draped in the usual sterile fashion. Next a preoperative timeout was performed.  A site was selected on in the midline for placement of the assistant port.  This was placed using a standard open Hassan technique which allowed entry into the peritoneal cavity under direct vision and without difficulty. A 12 mm port was placed and a pneumoperitoneum established. The camera was then used to inspect the abdomen and there was no evidence of any intra-abdominal injuries or other abnormalities. The remaining abdominal ports were then placed. 8 mm robotic ports were placed in the right upper quadrant, right lower quadrant, and far right lateral abdominal wall. An 8 mm port was placed for the camera site just right of the umbilicus. All ports were placed under direct vision without difficulty. The surgical cart was then docked.   Utilizing the cautery scissors, the white line of Toldt was incised allowing the colon to be mobilized medially and the plane between the mesocolon and the anterior layer of Gerota's fascia to be developed and the kidney to be exposed.  The ureter and gonadal vein were identified inferiorly and the ureter was lifted anteriorly off the psoas muscle.  Dissection proceeded superiorly along the gonadal vein until the renal vein was identified.  The renal hilum was then carefully isolated with a combination of blunt and sharp dissection allowing the renal arterial and venous structures to be separated and isolated in preparation for renal hilar vessel clamping.  There was a single renal vein and two renal arteries    Attention turned to the kidney and the perinephric fat surrounding the renal mass was removed and the kidney was mobilized sufficiently for exposure and resection of the renal mass.   Intraoperative renal ultrasonography was utilized with the laparoscopic ultrasound probe to identify the renal tumor and identify the tumor margins.   Once the  renal mass was properly isolated, preparations were made for resection of the tumor.  Reconstructive sutures were placed into the abdomen for the renorrhaphy portion of the procedure.  Both renal  arteries was then clamped with bulldog clamps.  The tumor was then excised with cold scissor dissection along with an adequate visible gross margin of normal renal parenchyma. The tumor appeared to be excised without any gross violation of the tumor. The renal collecting system was entered during removal of the tumor.  A running 3-0 V-lock suture was then brought through the capsule of the kidney and run along the base of the renal defect to provide hemostasis and close any entry into the renal collecting system if present. Weck clips were used to secure this suture outside the renal capsule at the proximal and distal ends. An additional hemostatic agent (Surgiflo) was then placed into the renal defect. A running 2-0 V lock suture was then used to close the capsule of the kidney using a sliding clip technique which resulted in excellent hemostasis.    The bulldog clamps were then removed from the renal hilar vessel(s). Total warm renal ischemia time was 14 minutes. The renal tumor resection site was examined. Hemostasis appeared adequate.   The kidney was placed back into its normal anatomic position and covered with perinephric fat as needed.  A # 15 Blake drain was then brought through the lateral lower port site and positioned in the perinephric space.  It was secured to the skin with a nylon suture. The surgical cart was undocked.  The renal tumor specimen was removed intact within an endopouch retrieval bag via the upper midline port site. This incision site was closed at the fascial layer with 0-vicryl suture. All other laparoscopic/robotic ports were removed under direct vision and the pneumoperitoneum let down with inspection of the operative field performed and hemostasis again confirmed. All incision sites were then injected with local anesthetic and reapproximated at the skin level with 4-0 monocryl subcuticular closures.  Dermabond was applied to the skin.  The patient tolerated the procedure well  and without complications.  The patient was able to be extubated and transferred to the recovery unit in satisfactory condition.  Scott CANDIE Renda Teddie MD

## 2023-06-11 NOTE — Interval H&P Note (Signed)
 History and Physical Interval Note:  06/11/2023 10:26 AM  [name]  has presented today for surgery, with the diagnosis of RIGHT RENAL NEOPLASM.  The various methods of treatment have been discussed with the patient and family. After consideration of risks, benefits and other options for treatment, the patient has consented to  Procedure(s) with comments: XI RIGHT ROBOTIC ASSISTED LAPAROSCOPIC PARTIAL NEPHRECTOMY (Right) - 210 MINUTES NEEDED FOR CASE as a surgical intervention.  The patient's history has been reviewed, patient examined, no change in status, stable for surgery.  I have reviewed the patient's chart and labs.  Questions were answered to the patient's satisfaction.     [employer]

## 2023-06-12 ENCOUNTER — Encounter (HOSPITAL_COMMUNITY): Payer: Self-pay | Admitting: Urology

## 2023-06-12 DIAGNOSIS — I5032 Chronic diastolic (congestive) heart failure: Secondary | ICD-10-CM | POA: Diagnosis present

## 2023-06-12 DIAGNOSIS — Z8673 Personal history of transient ischemic attack (TIA), and cerebral infarction without residual deficits: Secondary | ICD-10-CM | POA: Diagnosis not present

## 2023-06-12 DIAGNOSIS — I110 Hypertensive heart disease with heart failure: Secondary | ICD-10-CM | POA: Diagnosis present

## 2023-06-12 DIAGNOSIS — Z87891 Personal history of nicotine dependence: Secondary | ICD-10-CM | POA: Diagnosis not present

## 2023-06-12 DIAGNOSIS — I252 Old myocardial infarction: Secondary | ICD-10-CM | POA: Diagnosis not present

## 2023-06-12 DIAGNOSIS — Z841 Family history of disorders of kidney and ureter: Secondary | ICD-10-CM | POA: Diagnosis not present

## 2023-06-12 DIAGNOSIS — C641 Malignant neoplasm of right kidney, except renal pelvis: Secondary | ICD-10-CM | POA: Diagnosis present

## 2023-06-12 DIAGNOSIS — K219 Gastro-esophageal reflux disease without esophagitis: Secondary | ICD-10-CM | POA: Diagnosis present

## 2023-06-12 DIAGNOSIS — Z885 Allergy status to narcotic agent status: Secondary | ICD-10-CM | POA: Diagnosis not present

## 2023-06-12 DIAGNOSIS — D49511 Neoplasm of unspecified behavior of right kidney: Secondary | ICD-10-CM | POA: Diagnosis present

## 2023-06-12 DIAGNOSIS — E119 Type 2 diabetes mellitus without complications: Secondary | ICD-10-CM | POA: Diagnosis present

## 2023-06-12 DIAGNOSIS — Z8249 Family history of ischemic heart disease and other diseases of the circulatory system: Secondary | ICD-10-CM | POA: Diagnosis not present

## 2023-06-12 DIAGNOSIS — Z888 Allergy status to other drugs, medicaments and biological substances status: Secondary | ICD-10-CM | POA: Diagnosis not present

## 2023-06-12 LAB — BASIC METABOLIC PANEL
Anion gap: 10 (ref 5–15)
Anion gap: 10 (ref 5–15)
BUN: 20 mg/dL (ref 6–20)
BUN: 19 mg/dL (ref 6–20)
CO2: 23 mmol/L (ref 22–32)
CO2: 27 mmol/L (ref 22–32)
Calcium: 8.8 mg/dL — ABNORMAL LOW (ref 8.9–10.3)
Calcium: 9.1 mg/dL (ref 8.9–10.3)
Chloride: 99 mmol/L (ref 98–111)
Chloride: 99 mmol/L (ref 98–111)
Creatinine, Ser: 1.17 mg/dL (ref 0.61–1.24)
Creatinine, Ser: 1.35 mg/dL — ABNORMAL HIGH (ref 0.61–1.24)
GFR, Estimated: >60 mL/min (ref >60)
GFR, Estimated: >60 mL/min (ref >60)
Glucose, Bld: 117 mg/dL — ABNORMAL HIGH (ref 70–99)
Glucose, Bld: 141 mg/dL — ABNORMAL HIGH (ref 70–99)
Potassium: 3.1 mmol/L — ABNORMAL LOW (ref 3.5–5.1)
Potassium: 3 mmol/L — ABNORMAL LOW (ref 3.5–5.1)
Sodium: 132 mmol/L — ABNORMAL LOW (ref 135–145)
Sodium: 136 mmol/L (ref 135–145)

## 2023-06-12 LAB — GLUCOSE, CAPILLARY
Glucose-Capillary: 104 mg/dL — ABNORMAL HIGH (ref 70–99)
Glucose-Capillary: 110 mg/dL — ABNORMAL HIGH (ref 70–99)
Glucose-Capillary: 116 mg/dL — ABNORMAL HIGH (ref 70–99)
Glucose-Capillary: 120 mg/dL — ABNORMAL HIGH (ref 70–99)
Glucose-Capillary: 123 mg/dL — ABNORMAL HIGH (ref 70–99)
Glucose-Capillary: 126 mg/dL — ABNORMAL HIGH (ref 70–99)

## 2023-06-12 LAB — HEMOGLOBIN AND HEMATOCRIT, BLOOD
HCT: 40.8 % (ref 39.0–52.0)
Hemoglobin: 13.5 g/dL (ref 13.0–17.0)

## 2023-06-12 LAB — CREATININE, FLUID (PLEURAL, PERITONEAL, JP DRAINAGE): Creat, Fluid: 1.2 mg/dL

## 2023-06-12 MED ORDER — POTASSIUM CHLORIDE CRYS ER 20 MEQ PO TBCR: 20.0000 meq | EXTENDED_RELEASE_TABLET | Freq: Two times a day (BID) | ORAL | Status: DC

## 2023-06-12 MED ORDER — POTASSIUM CHLORIDE CRYS ER 20 MEQ PO TBCR
20.0000 meq | EXTENDED_RELEASE_TABLET | Freq: Three times a day (TID) | ORAL | Status: DC
  Administered 2023-06-12 – 2023-06-13 (×3): 20 meq via ORAL
  Filled 2023-06-12 (×3): qty 1

## 2023-06-12 MED ORDER — BISACODYL 10 MG RE SUPP
10.0000 mg | Freq: Once | RECTAL | Status: AC
  Administered 2023-06-12: 10 mg via RECTAL
  Filled 2023-06-12: qty 1

## 2023-06-12 MED ORDER — POTASSIUM CHLORIDE CRYS ER 20 MEQ PO TBCR: 20.0000 meq | EXTENDED_RELEASE_TABLET | Freq: Three times a day (TID) | ORAL | Status: DC

## 2023-06-12 MED ORDER — POTASSIUM CHLORIDE CRYS ER 20 MEQ PO TBCR
40.0000 meq | EXTENDED_RELEASE_TABLET | Freq: Once | ORAL | Status: AC
  Administered 2023-06-12: 40 meq via ORAL
  Filled 2023-06-12: qty 2

## 2023-06-12 MED ORDER — TRAMADOL HCL 50 MG PO TABS
50.0000 mg | ORAL_TABLET | Freq: Four times a day (QID) | ORAL | Status: DC | PRN
  Administered 2023-06-12 – 2023-06-13 (×3): 100 mg via ORAL
  Filled 2023-06-12 (×3): qty 2

## 2023-06-12 MED ORDER — ACETAMINOPHEN 325 MG PO TABS
650.0000 mg | ORAL_TABLET | Freq: Four times a day (QID) | ORAL | Status: DC | PRN
  Administered 2023-06-12 – 2023-06-13 (×2): 650 mg via ORAL
  Filled 2023-06-12 (×2): qty 2

## 2023-06-12 NOTE — Progress Notes (Signed)
   06/12/23 2206  BiPAP/CPAP/SIPAP  BiPAP/CPAP/SIPAP Pt Type Adult (Paatient prefers self placement when ready. Patient using his home equipment.)  BiPAP/CPAP/SIPAP Resmed  Mask Type Full face mask  Flow Rate 3 lpm (bled into circuit)  Heater Temperature  (No water  due to leak)  Patient Home Equipment Yes  Safety Check Completed by RT for Home Unit Yes, no issues noted

## 2023-06-12 NOTE — Progress Notes (Signed)
   06/12/23 1154  TOC Brief Assessment  Insurance and Status Reviewed  Patient has primary care physician Yes  Home environment has been reviewed home with friend  Prior level of function: independent  Prior/Current Home Services No current home services  Social Drivers of Health Review SDOH reviewed no interventions necessary  Readmission risk has been reviewed Yes  Transition of care needs no transition of care needs at this time

## 2023-06-12 NOTE — Plan of Care (Signed)
 ?  Problem: Clinical Measurements: ?Goal: Ability to maintain clinical measurements within normal limits will improve ?Outcome: Progressing ?Goal: Will remain free from infection ?Outcome: Progressing ?Goal: Diagnostic test results will improve ?Outcome: Progressing ?  ?

## 2023-06-12 NOTE — Progress Notes (Signed)
 Patient ID: [name], male   DOB: 1965/10/26, 58 y.o.   MRN: [id number]  1 Day Post-Op Subjective: Doing well.  Pain controlled with IV meds overnight.  Transdermal buprenorphine  patch has not yet been administered as ordered.  Objective: Vital signs in last 24 hours: Temp:  [97.7 F (36.5 C)-98.4 F (36.9 C)] 97.8 F (36.6 C) (01/07 0415) Pulse Rate:  [77-91] 78 (01/07 0415) Resp:  [11-20] 18 (01/07 0415) BP: (100-156)/(65-99) 100/65 (01/07 0415) SpO2:  [93 %-98 %] 96 % (01/07 0415) Weight:  [144.7 kg] 144.7 kg (01/06 1000)  Intake/Output from previous day: 01/06 0701 - 01/07 0700 In: 2773 [P.O.:330; I.V.:2131.4; IV Piggyback:311.6] Out: 8047 [Lmpwz:8314; Drains:117; Blood:150] Intake/Output this shift: No intake/output data recorded.  Physical Exam:  General: Alert and oriented CV: RRR Lungs: Clear Abdomen: Soft, ND Incisions: C/D/I Ext: NT, No erythema  Lab Results: Recent Labs    06/11/23 1523 06/12/23 0503  HGB 14.9 13.5  HCT 44.9 40.8   BMET Recent Labs    06/11/23 1523 06/12/23 0503  NA 136 132*  K 3.5 3.1*  CL 102 99  CO2 22 23  GLUCOSE 192* 117*  BUN 20 20  CREATININE 1.24 1.17  CALCIUM  9.3 8.8*     Studies/Results: No results found.  Assessment/Plan: POD # 1 s/p Right RAL partial nephrectomy - Ambulate, IS - Needs transdermal buprenorphine  patch ASAP for pain control and to help him be able to ambulate and progress.  Pain control will be a challenge.  Will supplement with po meds and Tylenol . - Advance diet - Replace K+ and recheck BMP later - Check drain fluid for Cr - Discharge will depend on pain control and ability to ambulate   LOS: 0 days   Noretta Ferrara 06/12/2023, 7:57 AM

## 2023-06-13 LAB — GLUCOSE, CAPILLARY
Glucose-Capillary: 205 mg/dL — ABNORMAL HIGH (ref 70–99)
Glucose-Capillary: 91 mg/dL (ref 70–99)

## 2023-06-13 LAB — BASIC METABOLIC PANEL WITH GFR
Anion gap: 10 (ref 5–15)
BUN: 22 mg/dL — ABNORMAL HIGH (ref 6–20)
CO2: 25 mmol/L (ref 22–32)
Calcium: 8.3 mg/dL — ABNORMAL LOW (ref 8.9–10.3)
Chloride: 95 mmol/L — ABNORMAL LOW (ref 98–111)
Creatinine, Ser: 1.39 mg/dL — ABNORMAL HIGH (ref 0.61–1.24)
GFR, Estimated: 58 mL/min — ABNORMAL LOW (ref >60)
Glucose, Bld: 239 mg/dL — ABNORMAL HIGH (ref 70–99)
Potassium: 2.9 mmol/L — ABNORMAL LOW (ref 3.5–5.1)
Sodium: 130 mmol/L — ABNORMAL LOW (ref 135–145)

## 2023-06-13 LAB — SURGICAL PATHOLOGY

## 2023-06-13 NOTE — Plan of Care (Signed)

## 2023-06-13 NOTE — Progress Notes (Signed)
Pt was discharged home today. Instructions were reviewed with patient, and questions were answered. Pt was taken to main entrance via wheelchair by NT.
